# Patient Record
Sex: Male | Born: 1945 | Race: White | Hispanic: No | Smoking: Former smoker
Health system: Southern US, Community
[De-identification: ages and names within clinical notes are randomized; demographics above are authoritative.]

## PROBLEM LIST (undated history)

## (undated) DIAGNOSIS — D649 Anemia, unspecified: Secondary | ICD-10-CM

## (undated) DIAGNOSIS — J449 Chronic obstructive pulmonary disease, unspecified: Secondary | ICD-10-CM

## (undated) DIAGNOSIS — I10 Essential (primary) hypertension: Secondary | ICD-10-CM

## (undated) DIAGNOSIS — C801 Malignant (primary) neoplasm, unspecified: Secondary | ICD-10-CM

## (undated) DIAGNOSIS — Z9289 Personal history of other medical treatment: Secondary | ICD-10-CM

---

## 1998-10-12 ENCOUNTER — Encounter: Payer: Self-pay | Admitting: Family Medicine

## 1998-10-12 ENCOUNTER — Ambulatory Visit (HOSPITAL_COMMUNITY): Admission: RE | Admit: 1998-10-12 | Discharge: 1998-10-12 | Payer: Self-pay | Admitting: Family Medicine

## 2000-01-15 ENCOUNTER — Ambulatory Visit (HOSPITAL_COMMUNITY): Admission: RE | Admit: 2000-01-15 | Discharge: 2000-01-15 | Payer: Self-pay | Admitting: Family Medicine

## 2000-01-15 ENCOUNTER — Encounter: Payer: Self-pay | Admitting: Family Medicine

## 2000-02-14 ENCOUNTER — Ambulatory Visit (HOSPITAL_COMMUNITY): Admission: RE | Admit: 2000-02-14 | Discharge: 2000-02-14 | Payer: Self-pay | Admitting: *Deleted

## 2006-01-10 ENCOUNTER — Ambulatory Visit (HOSPITAL_COMMUNITY): Admission: RE | Admit: 2006-01-10 | Discharge: 2006-01-10 | Payer: Self-pay | Admitting: General Surgery

## 2006-04-30 ENCOUNTER — Emergency Department (HOSPITAL_COMMUNITY): Admission: EM | Admit: 2006-04-30 | Discharge: 2006-04-30 | Payer: Self-pay | Admitting: Emergency Medicine

## 2008-01-09 IMAGING — CR DG CHEST 2V
2 series · 2 of 2 positions shown · non-contrast
Comparison: 01/15/00.

CLINICAL DATA: Left inguinal hernia.  History of smoking.
 CHEST ? 2 VIEW ? 01/10/06:

[view not recorded (1 of 2)]
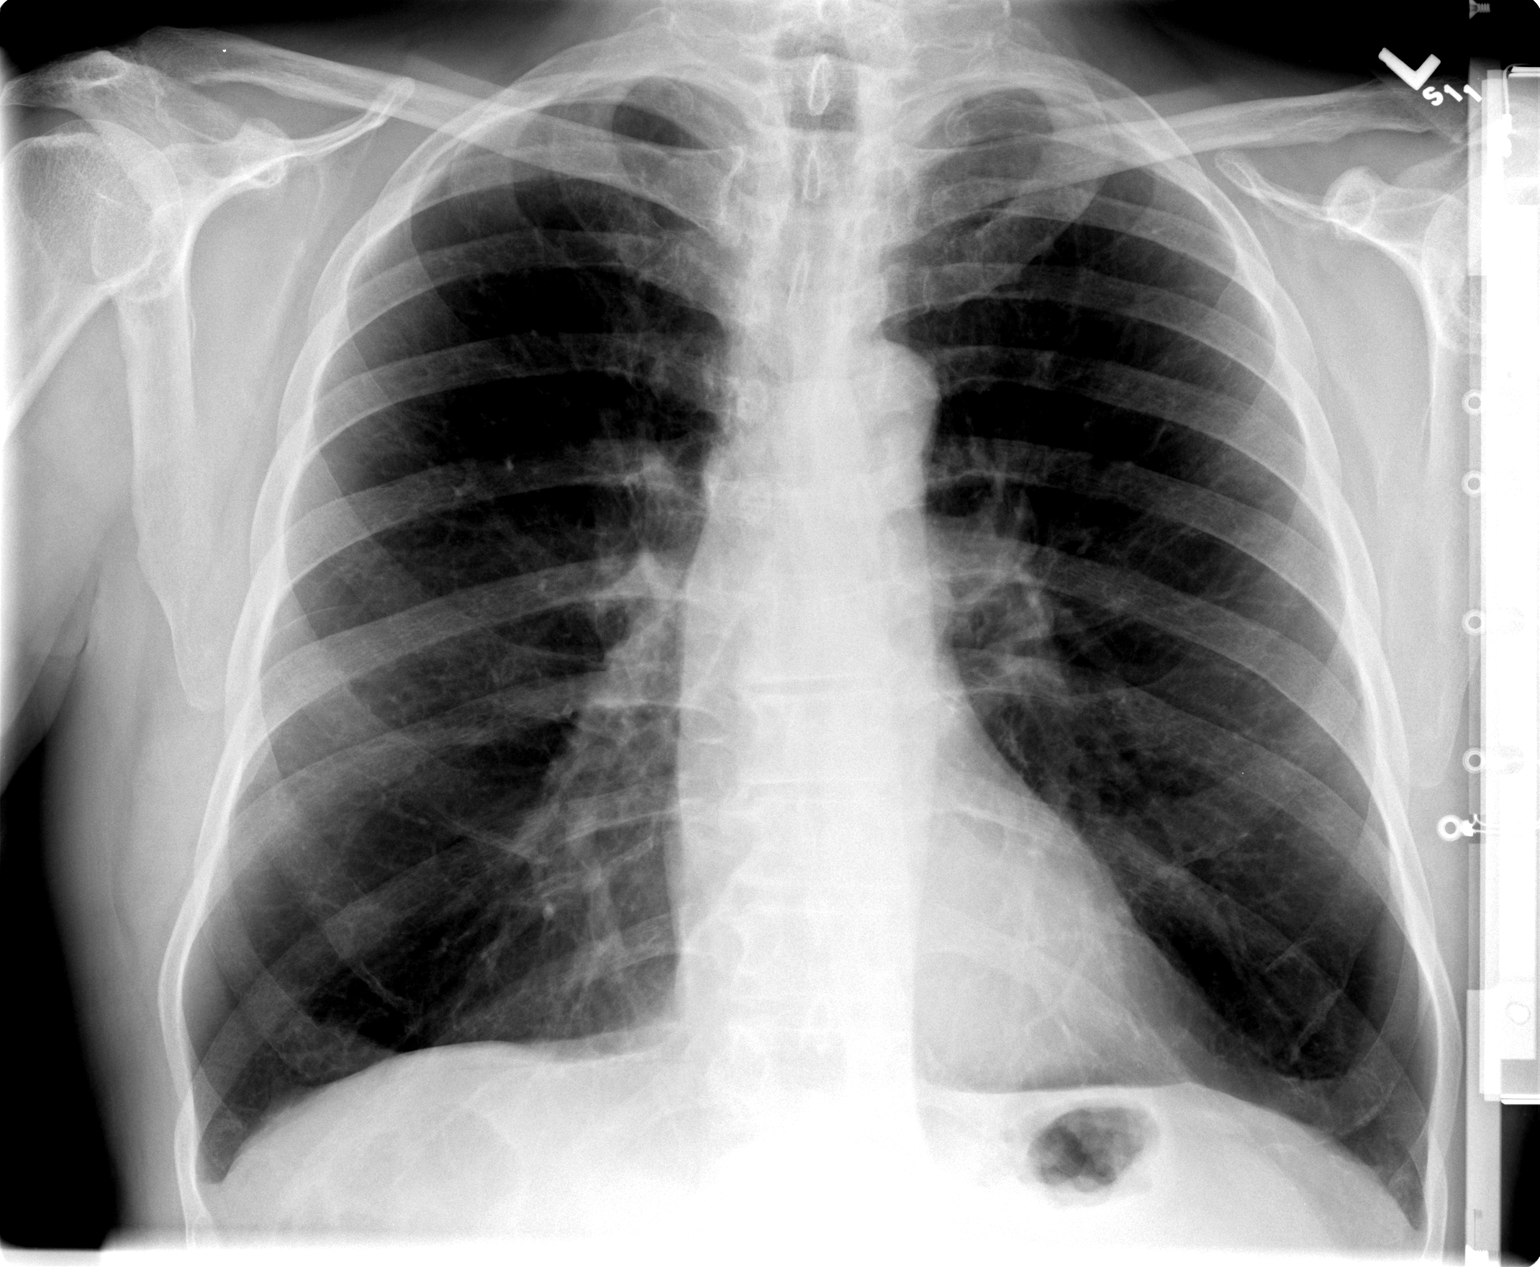

[view not recorded (2 of 2)]
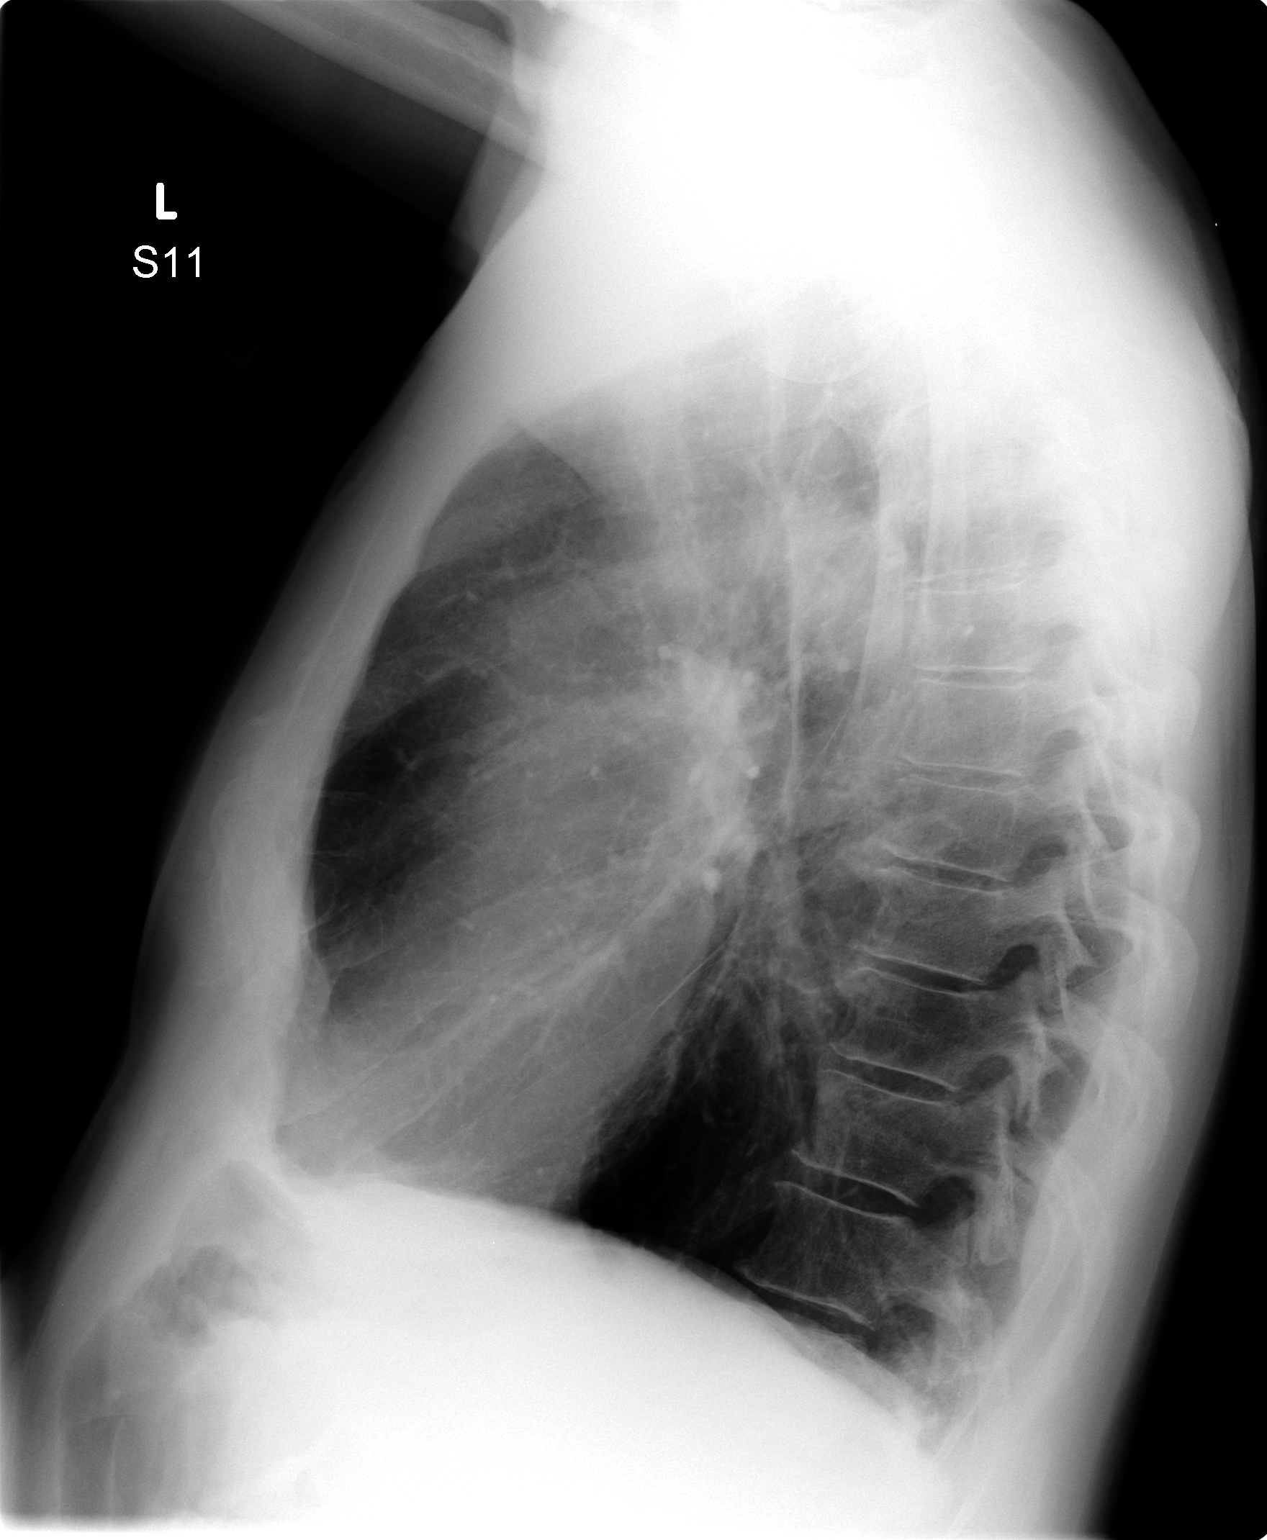

[2 of 2 positions shown; findings below may reference images not displayed]

FINDINGS: The heart size and mediastinal contours are within normal limits.  Both lungs are clear.  The visualized skeletal structures are unremarkable.
IMPRESSION: No active cardiopulmonary disease.

## 2008-04-28 IMAGING — CR DG CHEST 1V PORT
1 series · 1 of 1 positions shown · non-contrast
Comparison: 01/10/2006.

CLINICAL DATA: Chest pain.  Productive cough.  Smoker.   
 PORTABLE CHEST - 1 VIEW:

[view not recorded]
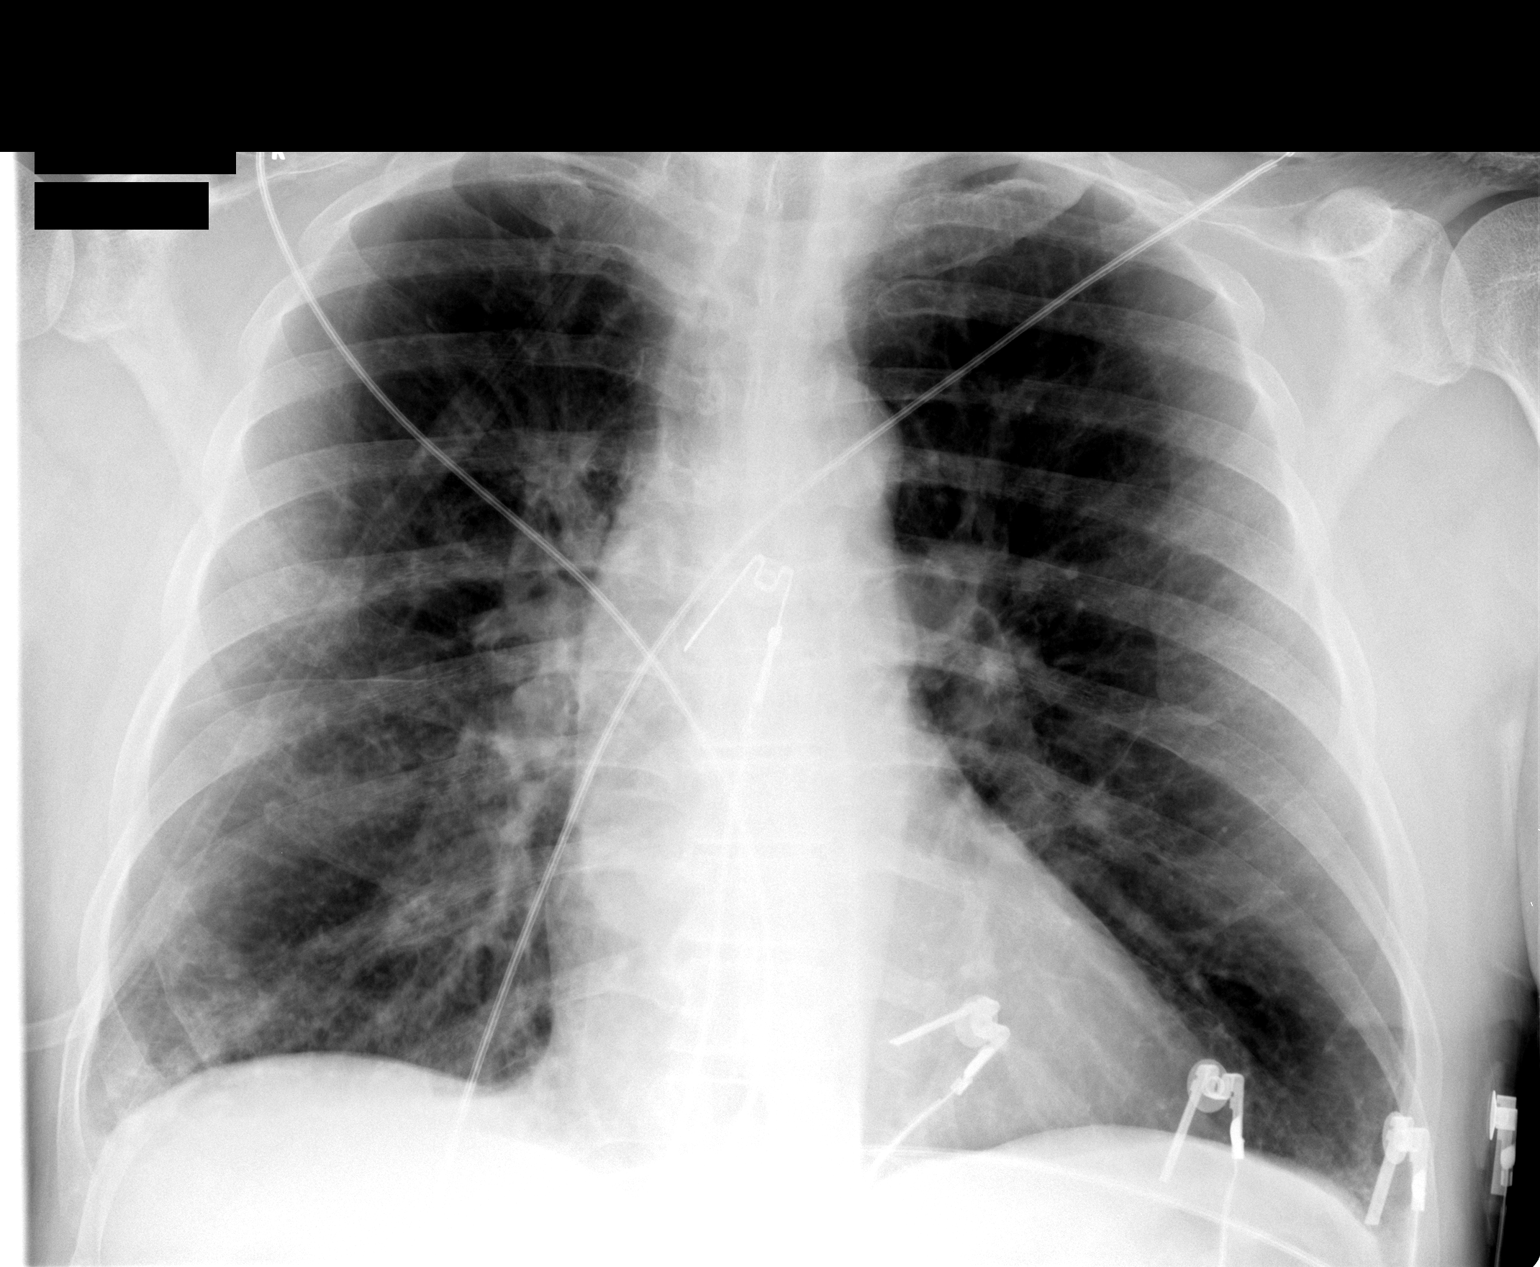

[1 of 1 positions shown; findings below may reference images not displayed]

There has been interval development of mild pulmonary infiltrate in the right lower lung, suspicious for pneumonia.  Left lung remains clear.  Changes of COPD are again seen.  Heart size is within normal limits.
IMPRESSION: 1.   Interval development of mild infiltrate in the right lower lung zone, suspicious for pneumonia.   Radiographic followup is recommended.  
 2.  COPD.

## 2012-10-06 ENCOUNTER — Ambulatory Visit
Admission: RE | Admit: 2012-10-06 | Discharge: 2012-10-06 | Disposition: A | Discharge status: Home / Self Care | Payer: Medicare HMO | Source: Ambulatory Visit | Attending: Family Medicine | Admitting: Family Medicine

## 2012-10-06 ENCOUNTER — Other Ambulatory Visit: Payer: Self-pay | Admitting: Family Medicine

## 2012-10-06 DIAGNOSIS — J449 Chronic obstructive pulmonary disease, unspecified: Secondary | ICD-10-CM

## 2013-08-30 ENCOUNTER — Emergency Department (HOSPITAL_COMMUNITY): Payer: Medicare HMO

## 2013-08-30 ENCOUNTER — Encounter (HOSPITAL_COMMUNITY): Payer: Self-pay | Admitting: Emergency Medicine

## 2013-08-30 ENCOUNTER — Inpatient Hospital Stay (HOSPITAL_COMMUNITY)
Admission: EM | Admit: 2013-08-30 | Discharge: 2013-09-01 | DRG: 189 | Disposition: A | Discharge status: Home / Self Care | Payer: Medicare HMO | Attending: Internal Medicine | Admitting: Internal Medicine

## 2013-08-30 DIAGNOSIS — J209 Acute bronchitis, unspecified: Secondary | ICD-10-CM | POA: Diagnosis present

## 2013-08-30 DIAGNOSIS — F17200 Nicotine dependence, unspecified, uncomplicated: Secondary | ICD-10-CM | POA: Diagnosis present

## 2013-08-30 DIAGNOSIS — Z79899 Other long term (current) drug therapy: Secondary | ICD-10-CM

## 2013-08-30 DIAGNOSIS — J9600 Acute respiratory failure, unspecified whether with hypoxia or hypercapnia: Principal | ICD-10-CM | POA: Diagnosis present

## 2013-08-30 DIAGNOSIS — R0603 Acute respiratory distress: Secondary | ICD-10-CM | POA: Diagnosis present

## 2013-08-30 DIAGNOSIS — J440 Chronic obstructive pulmonary disease with acute lower respiratory infection: Secondary | ICD-10-CM | POA: Diagnosis present

## 2013-08-30 DIAGNOSIS — R0602 Shortness of breath: Secondary | ICD-10-CM | POA: Diagnosis present

## 2013-08-30 DIAGNOSIS — I10 Essential (primary) hypertension: Secondary | ICD-10-CM | POA: Diagnosis present

## 2013-08-30 DIAGNOSIS — J984 Other disorders of lung: Secondary | ICD-10-CM

## 2013-08-30 HISTORY — DX: Chronic obstructive pulmonary disease, unspecified: J44.9

## 2013-08-30 HISTORY — DX: Essential (primary) hypertension: I10

## 2013-08-30 LAB — BASIC METABOLIC PANEL
BUN: 25 mg/dL — ABNORMAL HIGH (ref 6–23)
CO2: 30 mEq/L (ref 19–32)
Calcium: 9.6 mg/dL (ref 8.4–10.5)
Chloride: 103 mEq/L (ref 96–112)
Creatinine, Ser: 1.05 mg/dL (ref 0.50–1.35)
GFR calc Af Amer: 82 mL/min — ABNORMAL LOW (ref >90)
Potassium: 3.8 mEq/L (ref 3.5–5.1)
Sodium: 143 mEq/L (ref 135–145)

## 2013-08-30 LAB — CBC WITH DIFFERENTIAL/PLATELET
Basophils Absolute: 0 10*3/uL (ref 0.0–0.1)
Basophils Relative: 0 % (ref 0–1)
Eosinophils Relative: 1 % (ref 0–5)
HCT: 38.1 % — ABNORMAL LOW (ref 39.0–52.0)
Hemoglobin: 12.9 g/dL — ABNORMAL LOW (ref 13.0–17.0)
Lymphs Abs: 1.3 10*3/uL (ref 0.7–4.0)
MCHC: 33.9 g/dL (ref 30.0–36.0)
MCV: 90.1 fL (ref 78.0–100.0)
Monocytes Absolute: 0.3 10*3/uL (ref 0.1–1.0)
Monocytes Relative: 4 % (ref 3–12)
Neutro Abs: 6.8 10*3/uL (ref 1.7–7.7)
RDW: 13.4 % (ref 11.5–15.5)

## 2013-08-30 MED ORDER — LEVOFLOXACIN IN D5W 750 MG/150ML IV SOLN
750.0000 mg | INTRAVENOUS | Status: DC
  Administered 2013-08-30: 750 mg via INTRAVENOUS
  Filled 2013-08-30: qty 150

## 2013-08-30 MED ORDER — LISINOPRIL 20 MG PO TABS
20.0000 mg | ORAL_TABLET | Freq: Every day | ORAL | Status: DC
  Administered 2013-08-30 – 2013-09-01 (×3): 20 mg via ORAL
  Filled 2013-08-30 (×4): qty 1

## 2013-08-30 MED ORDER — LEVOFLOXACIN 750 MG PO TABS
750.0000 mg | ORAL_TABLET | ORAL | Status: DC
  Administered 2013-08-30: 750 mg via ORAL
  Filled 2013-08-30: qty 1

## 2013-08-30 MED ORDER — SODIUM CHLORIDE 0.9 % IV SOLN: 250.0000 mL | INTRAVENOUS | Status: DC | PRN

## 2013-08-30 MED ORDER — ALBUTEROL SULFATE (5 MG/ML) 0.5% IN NEBU
2.5000 mg | INHALATION_SOLUTION | Freq: Four times a day (QID) | RESPIRATORY_TRACT | Status: DC
  Administered 2013-08-30 – 2013-08-31 (×3): 2.5 mg via RESPIRATORY_TRACT
  Filled 2013-08-30 (×3): qty 0.5

## 2013-08-30 MED ORDER — ONDANSETRON HCL 4 MG PO TABS: 4.0000 mg | ORAL_TABLET | Freq: Four times a day (QID) | ORAL | Status: DC | PRN

## 2013-08-30 MED ORDER — IPRATROPIUM BROMIDE 0.02 % IN SOLN
RESPIRATORY_TRACT | Status: AC
  Filled 2013-08-30: qty 5

## 2013-08-30 MED ORDER — PREDNISONE 50 MG PO TABS
50.0000 mg | ORAL_TABLET | Freq: Every day | ORAL | Status: DC
  Administered 2013-08-31: 50 mg via ORAL
  Filled 2013-08-30 (×2): qty 1

## 2013-08-30 MED ORDER — IPRATROPIUM BROMIDE 0.02 % IN SOLN
0.5000 mg | Freq: Four times a day (QID) | RESPIRATORY_TRACT | Status: DC
  Administered 2013-08-30 – 2013-08-31 (×3): 0.5 mg via RESPIRATORY_TRACT
  Filled 2013-08-30 (×3): qty 2.5

## 2013-08-30 MED ORDER — SIMVASTATIN 20 MG PO TABS
20.0000 mg | ORAL_TABLET | Freq: Every day | ORAL | Status: DC
  Administered 2013-08-31 – 2013-09-01 (×2): 20 mg via ORAL
  Filled 2013-08-30 (×4): qty 1

## 2013-08-30 MED ORDER — ALBUTEROL SULFATE (5 MG/ML) 0.5% IN NEBU: 5.0000 mg | INHALATION_SOLUTION | RESPIRATORY_TRACT | Status: DC | PRN

## 2013-08-30 MED ORDER — ALBUTEROL SULFATE (5 MG/ML) 0.5% IN NEBU: 2.5000 mg | INHALATION_SOLUTION | RESPIRATORY_TRACT | Status: DC | PRN

## 2013-08-30 MED ORDER — ONDANSETRON HCL 4 MG/2ML IJ SOLN: 4.0000 mg | Freq: Four times a day (QID) | INTRAMUSCULAR | Status: DC | PRN

## 2013-08-30 MED ORDER — BUDESONIDE-FORMOTEROL FUMARATE 160-4.5 MCG/ACT IN AERO
2.0000 | INHALATION_SPRAY | Freq: Two times a day (BID) | RESPIRATORY_TRACT | Status: DC
  Administered 2013-08-30 – 2013-09-01 (×4): 2 via RESPIRATORY_TRACT
  Filled 2013-08-30: qty 6

## 2013-08-30 MED ORDER — SODIUM CHLORIDE 0.9 % IJ SOLN: 3.0000 mL | INTRAMUSCULAR | Status: DC | PRN

## 2013-08-30 MED ORDER — HYDRALAZINE HCL 20 MG/ML IJ SOLN: 10.0000 mg | Freq: Four times a day (QID) | INTRAMUSCULAR | Status: DC | PRN

## 2013-08-30 MED ORDER — SODIUM CHLORIDE 0.9 % IJ SOLN
3.0000 mL | Freq: Two times a day (BID) | INTRAMUSCULAR | Status: DC
  Administered 2013-08-31 (×2): 3 mL via INTRAVENOUS

## 2013-08-30 MED ORDER — ALBUTEROL (5 MG/ML) CONTINUOUS INHALATION SOLN
INHALATION_SOLUTION | RESPIRATORY_TRACT | Status: AC
  Filled 2013-08-30: qty 20

## 2013-08-30 NOTE — ED Notes (Signed)
Report given to floor, Teresa Coombs, RN. Nurse has no further questions upon report given. Pt being prepared for transport to floor via Harriett Sine, Charity fundraiser.

## 2013-08-30 NOTE — ED Notes (Signed)
Patient from home via [facility] c/o of SOB that progressively got worse this morning.  Patient states " I have been sick for about two weeks with congestion.  I've also have a cough with some junk coming up"  Patient denies any pain or fever.  Patient received 15 of albuterol, 1 of Atrovent and 125 of solumedrol PTA with EMS.  NAD at this time, patient receiving neb treatment at this time.  Respiratory and MD at bedside.

## 2013-08-30 NOTE — ED Provider Notes (Signed)
CSN: [id number]     Arrival date & time 08/30/13  1309 History   First MD Initiated Contact with Patient 08/30/13 1323     Chief Complaint  Patient presents with  . Shortness of Breath   (Consider location/radiation/quality/duration/timing/severity/associated sxs/prior Treatment) Patient is a 67 y.o. male presenting with shortness of breath.  Shortness of Breath Severity:  Severe Onset quality:  Gradual Duration:  2 weeks Timing:  Constant Progression:  Worsening Chronicity:  Recurrent Context: URI   Relieved by:  Oxygen (cpap, albuterol) Worsened by:  Nothing tried Associated symptoms: cough and sputum production   Associated symptoms: no abdominal pain, no chest pain, no fever and no vomiting     Past Medical History  Diagnosis Date  . COPD (chronic obstructive pulmonary disease)   . Hypertension    History reviewed. No pertinent past surgical history. History reviewed. No pertinent family history. History  Substance Use Topics  . Smoking status: Current Some Day Smoker -- 0.50 packs/day for 25 years    Types: Cigarettes  . Smokeless tobacco: Not on file  . Alcohol Use: No    Review of Systems  Constitutional: Negative for fever.  HENT: Negative for congestion.   Respiratory: Positive for cough, sputum production and shortness of breath.   Cardiovascular: Negative for chest pain.  Gastrointestinal: Negative for nausea, vomiting, abdominal pain and diarrhea.  All other systems reviewed and are negative.    Allergies  Review of patient's allergies indicates no known allergies.  Home Medications  No current outpatient prescriptions on file. BP 126/63  Pulse 121  Temp(Src) 98.7 F (37.1 C) (Axillary)  Resp 19  SpO2 98% Physical Exam  Nursing note and vitals reviewed. Constitutional: He is oriented to person, place, and time. He appears well-developed and well-nourished. No distress.  HENT:  Head: Normocephalic and atraumatic.  Mouth/Throat: Oropharynx is  clear and moist.  Eyes: Conjunctivae are normal. Pupils are equal, round, and reactive to light. No scleral icterus.  Neck: Neck supple.  Cardiovascular: Normal rate, regular rhythm, normal heart sounds and intact distal pulses.   No murmur heard. Pulmonary/Chest: Accessory muscle usage present. No stridor. Tachypnea noted. He is in respiratory distress. He has decreased breath sounds. He has wheezes. He has no rales.  Abdominal: Soft. He exhibits no distension. There is no tenderness.  Musculoskeletal: Normal range of motion. He exhibits no edema.  Neurological: He is alert and oriented to person, place, and time.  Skin: Skin is warm and dry. No rash noted.  Psychiatric: He has a normal mood and affect. His behavior is normal.    ED Course  CRITICAL CARE Performed by: Blake Divine DAVID Authorized by: Blake Divine DAVID Total critical care time: 35 minutes Critical care time was exclusive of separately billable procedures and treating other patients. Critical care was necessary to treat or prevent imminent or life-threatening deterioration of the following conditions: respiratory failure. Critical care was time spent personally by me on the following activities: development of treatment plan with patient or surrogate, discussions with consultants, evaluation of patient's response to treatment, examination of patient, obtaining history from patient or surrogate, ordering and performing treatments and interventions, ordering and review of laboratory studies, ordering and review of radiographic studies, pulse oximetry, re-evaluation of patient's condition and review of old charts.   (including critical care time) Labs Review All labs drawn in ED reviewed.  Imaging Review Dg Chest Port 1 View  08/30/2013   CLINICAL DATA:  Shortness of Breath. COPD and hypertension.  EXAM: PORTABLE CHEST - 1 VIEW  COMPARISON:  10/06/2012  FINDINGS: COPD/hyperinflation. Multiple leads and wires overlie the  chest. Midline trachea. Normal heart size with tortuous thoracic aorta. Mild pleural thickening blunts the costophrenic angles bilaterally. No pneumothorax. No lobar consolidation.  IMPRESSION: COPD/hyperinflation, without acute superimposed process.   Electronically Signed   By: Jeronimo Greaves   On: 08/30/2013 14:01  All radiology studies independently viewed by me.     MDM   1. Acute respiratory distress   2. COPD (chronic obstructive pulmonary disease) with acute bronchitis   3. HTN (hypertension)   4. SOB (shortness of breath)    67 yo male with hx of COPD presenting in respiratory distress.  O2 sats in mid 70's on EMS arrival on scene.  CPAP initiated PTA.  Solumedrol given PTA  Able to dc bipap and treat with continuous albuterol and Atrovent neb.  Eventually, air movement improved, but he still required supplemental O2.  Admitted to internal medicine.  IV levofloxacin administered.      Candyce Churn, MD 08/31/13 [phone]

## 2013-08-30 NOTE — H&P (Signed)
[facility] History and Physical  [name]:[id number] DOB: 12/14/1945 DOA: 08/30/2013  Referring physician: er PCP: No primary provider on file. - Dr. At brown [facility]:   Chief Complaint: sob  HPI: William Villegas is a 67 y.o. male  Who has history of COPD, not on home O2.  Last exacerbation was about a year ago.  Still smoking about 1 ppd.  Developed SOB this AM and it has gradually worsened. He has + sick contacts with grandson (head cold) and has had upper resp symptoms for about 2 weeks.  +production- not sure of color.  Given albuterol, Atrovent, solumedrol and O2 with improvement in ER.  No fever, no chills  In the ER, his x ray was clear- no PNA and on 2L was oxygenating well.    Review of Systems: all systems reviewed, negative unless stated above   Past Medical History  Diagnosis Date  . COPD (chronic obstructive pulmonary disease)   . Hypertension    History reviewed. No pertinent past surgical history. Social History:  reports that he has been smoking Cigarettes.  He has a 12.5 pack-year smoking history. He does not have any smokeless tobacco history on file. He reports that he does not drink alcohol or use illicit drugs.  No Known Allergies  History reviewed. No pertinent family history.   Prior to Admission medications   Medication Sig Start Date End Date Taking? Authorizing Provider  albuterol (PROVENTIL HFA;VENTOLIN HFA) 108 (90 BASE) MCG/ACT inhaler Inhale 2 puffs into the lungs every 6 (six) hours as needed for wheezing.   Yes Historical Provider, MD  budesonide-formoterol (SYMBICORT) 160-4.5 MCG/ACT inhaler Inhale 2 puffs into the lungs 2 (two) times daily.   Yes Historical Provider, MD  pravastatin (PRAVACHOL) 40 MG tablet Take 40 mg by mouth daily.   Yes Historical Provider, MD  lisinopril (PRINIVIL,ZESTRIL) 20 MG tablet Take 20 mg by mouth daily.    Historical Provider, MD   Physical Exam: Filed Vitals:   08/30/13 1323  BP: 126/63   Pulse: 121  Temp: 98.7 F (37.1 C)  Resp: 19     General:  A+Ox3, mild work of breathing  Eyes: wnl  ENT: wnl  Neck: supple  Cardiovascular: rrr  Respiratory: mild wheezing, no crackles  Abdomen: +BS, soft, NT  Skin: no rashes or lesions  Musculoskeletal: moves all 4 ext  Psychiatric: no SI, no HI  Neurologic: CN 2-12 intact  Labs on Admission:  Basic Metabolic Panel:  Recent Labs Lab 08/30/13 1349  NA 143  K 3.8  CL 103  CO2 30  GLUCOSE 183*  BUN 25*  CREATININE 1.05  CALCIUM 9.6   Liver Function Tests: No results found for this basename: AST, ALT, ALKPHOS, BILITOT, PROT, ALBUMIN,  in the last 168 hours No results found for this basename: LIPASE, AMYLASE,  in the last 168 hours No results found for this basename: AMMONIA,  in the last 168 hours CBC:  Recent Labs Lab 08/30/13 1349  WBC 8.5  NEUTROABS 6.8  HGB 12.9*  HCT 38.1*  MCV 90.1  PLT 241   Cardiac Enzymes: No results found for this basename: CKTOTAL, CKMB, CKMBINDEX, TROPONINI,  in the last 168 hours  BNP (last 3 results) No results found for this basename: PROBNP,  in the last 8760 hours CBG: No results found for this basename: GLUCAP,  in the last 168 hours  Radiological Exams on Admission: Dg Chest Port 1 View  08/30/2013   CLINICAL DATA:  Shortness of  Breath. COPD and hypertension.  EXAM: PORTABLE CHEST - 1 VIEW  COMPARISON:  10/06/2012  FINDINGS: COPD/hyperinflation. Multiple leads and wires overlie the chest. Midline trachea. Normal heart size with tortuous thoracic aorta. Mild pleural thickening blunts the costophrenic angles bilaterally. No pneumothorax. No lobar consolidation.  IMPRESSION: COPD/hyperinflation, without acute superimposed process.   Electronically Signed   By: Jeronimo Greaves   On: 08/30/2013 14:01     Assessment/Plan Active Problems:   Acute respiratory distress   SOB (shortness of breath)   COPD (chronic obstructive pulmonary disease) with acute bronchitis    HTN (hypertension)   1. Acute resp failure: not on O2 at home- requiring 2 L here, wean off as tolerated 2. SOB: steroids, nebs, abx 3. COPD: very mild exacerbation- patient would like to avoid IV medications, PO steroids, abx 4. HTN- continue home meds 5. Tobacco abuse- encouraged cessation    Code Status: full Family Communication: patient Disposition Plan: obs  Time spent: 75 min  VANN, JESSICA [facility] Pager [phone]  If 7PM-7AM, please contact night-coverage [email or web address] Password [facility] 08/30/2013, 3:37 PM

## 2013-08-31 LAB — CBC
HCT: 33.6 % — ABNORMAL LOW (ref 39.0–52.0)
MCH: 29.9 pg (ref 26.0–34.0)
MCHC: 33.9 g/dL (ref 30.0–36.0)
Platelets: 226 10*3/uL (ref 150–400)
RBC: 3.81 MIL/uL — ABNORMAL LOW (ref 4.22–5.81)
RDW: 13.2 % (ref 11.5–15.5)
WBC: 4.7 10*3/uL (ref 4.0–10.5)

## 2013-08-31 LAB — BASIC METABOLIC PANEL
Calcium: 10 mg/dL (ref 8.4–10.5)
Chloride: 101 mEq/L (ref 96–112)
Creatinine, Ser: 1.05 mg/dL (ref 0.50–1.35)
GFR calc non Af Amer: 70 mL/min — ABNORMAL LOW (ref >90)
Glucose, Bld: 131 mg/dL — ABNORMAL HIGH (ref 70–99)
Sodium: 138 mEq/L (ref 135–145)

## 2013-08-31 MED ORDER — LEVOFLOXACIN 750 MG PO TABS
750.0000 mg | ORAL_TABLET | Freq: Every day | ORAL | Status: DC
  Administered 2013-08-31: 750 mg via ORAL
  Filled 2013-08-31 (×2): qty 1

## 2013-08-31 MED ORDER — PREDNISONE 20 MG PO TABS
40.0000 mg | ORAL_TABLET | Freq: Two times a day (BID) | ORAL | Status: DC
  Administered 2013-08-31 – 2013-09-01 (×2): 40 mg via ORAL
  Filled 2013-08-31 (×4): qty 2

## 2013-08-31 MED ORDER — IPRATROPIUM BROMIDE 0.02 % IN SOLN
0.5000 mg | Freq: Four times a day (QID) | RESPIRATORY_TRACT | Status: DC
  Administered 2013-08-31 – 2013-09-01 (×5): 0.5 mg via RESPIRATORY_TRACT
  Filled 2013-08-31 (×5): qty 2.5

## 2013-08-31 MED ORDER — ALBUTEROL SULFATE (5 MG/ML) 0.5% IN NEBU: 2.5000 mg | INHALATION_SOLUTION | Freq: Three times a day (TID) | RESPIRATORY_TRACT | Status: DC

## 2013-08-31 MED ORDER — IPRATROPIUM BROMIDE 0.02 % IN SOLN: 0.5000 mg | Freq: Three times a day (TID) | RESPIRATORY_TRACT | Status: DC

## 2013-08-31 MED ORDER — ALBUTEROL SULFATE (5 MG/ML) 0.5% IN NEBU
2.5000 mg | INHALATION_SOLUTION | Freq: Four times a day (QID) | RESPIRATORY_TRACT | Status: DC
  Administered 2013-08-31 – 2013-09-01 (×5): 2.5 mg via RESPIRATORY_TRACT
  Filled 2013-08-31 (×5): qty 0.5

## 2013-08-31 NOTE — Progress Notes (Signed)
SATURATION QUALIFICATIONS: (This note is used to comply with regulatory documentation for home oxygen)  Patient Saturations on Room Air at Rest = 91  Patient Saturations on Room Air while Ambulating =90  Patient Saturations on 2 Liters of oxygen while Ambulating = 93  Please briefly explain why patient needs home oxygen: 

## 2013-08-31 NOTE — Progress Notes (Signed)
Chart reviewed.  [facility] PROGRESS NOTE  William Villegas [id number]:[id number] DOB: [birth year] DOA: 08/30/2013 PCP: Leo Grosser, MD  Assessment/Plan:  Active Problems:   Acute respiratory distress   SOB (shortness of breath)   COPD (chronic obstructive pulmonary disease) with acute bronchitis   HTN (hypertension)  Patient feels too DOE for discharge. Per RN, sats did not drop with ambulation.  Change to inpatient.  Increase pred to 40 bid.  Needs to quit smoking  Code Status: *full Family Communication: *none Disposition Plan: home  Antibiotics:  levaquin 9/22  HPI/Subjective: Too DOE for discharge today  Objective: Filed Vitals:   08/31/13 0523  BP: 119/89  Pulse: 111  Temp: 97.9 F (36.6 C)  Resp: 17    Intake/Output Summary (Last 24 hours) at 08/31/13 1136 Last data filed at 08/31/13 0900  Gross per 24 hour  Intake    240 ml  Output      0 ml  Net    240 ml   Filed Weights   08/30/13 1740  Weight: 72.576 kg (160 lb)    Exam:   General:  Mild respiratory distress when talking.  Cardiovascular: RRR without MGR  Respiratory: diminished thoughout without WRR  Ext no CCE  Data Reviewed: Basic Metabolic Panel:  Recent Labs Lab 08/30/13 1349 08/31/13 0420  NA 143 138  K 3.8 4.0  CL 103 101  CO2 30 26  GLUCOSE 183* 131*  BUN 25* 31*  CREATININE 1.05 1.05  CALCIUM 9.6 10.0   Liver Function Tests: No results found for this basename: AST, ALT, ALKPHOS, BILITOT, PROT, ALBUMIN,  in the last 168 hours No results found for this basename: LIPASE, AMYLASE,  in the last 168 hours No results found for this basename: AMMONIA,  in the last 168 hours CBC:  Recent Labs Lab 08/30/13 1349 08/31/13 0420  WBC 8.5 4.7  NEUTROABS 6.8  --   HGB 12.9* 11.4*  HCT 38.1* 33.6*  MCV 90.1 88.2  PLT 241 226   Cardiac Enzymes: No results found for this basename: CKTOTAL, CKMB, CKMBINDEX, TROPONINI,  in the last 168 hours BNP (last 3 results) No  results found for this basename: PROBNP,  in the last 8760 hours CBG: No results found for this basename: GLUCAP,  in the last 168 hours  No results found for this or any previous visit (from the past 240 hour(s)).   Studies: Dg Chest Port 1 View  08/30/2013   CLINICAL DATA:  Shortness of Breath. COPD and hypertension.  EXAM: PORTABLE CHEST - 1 VIEW  COMPARISON:  10/06/2012  FINDINGS: COPD/hyperinflation. Multiple leads and wires overlie the chest. Midline trachea. Normal heart size with tortuous thoracic aorta. Mild pleural thickening blunts the costophrenic angles bilaterally. No pneumothorax. No lobar consolidation.  IMPRESSION: COPD/hyperinflation, without acute superimposed process.   Electronically Signed   By: Jeronimo Greaves   On: 08/30/2013 14:01    Scheduled Meds: . albuterol  2.5 mg Nebulization TID  . budesonide-formoterol  2 puff Inhalation BID  . ipratropium  0.5 mg Nebulization TID  . levofloxacin  750 mg Oral Q48H  . lisinopril  20 mg Oral Daily  . predniSONE  50 mg Oral Q breakfast  . simvastatin  20 mg Oral q1800  . sodium chloride  3 mL Intravenous Q12H   Continuous Infusions:   Time spent: 30 min  Piercen Covino L  [facility] Pager [phone] If 7PM-7AM, please contact night-coverage at [email or web address], password [facility] 08/31/2013, 11:36 AM  LOS: 1 day

## 2013-09-01 DIAGNOSIS — F17200 Nicotine dependence, unspecified, uncomplicated: Secondary | ICD-10-CM | POA: Diagnosis present

## 2013-09-01 MED ORDER — LEVOFLOXACIN 750 MG PO TABS: 750.0000 mg | ORAL_TABLET | Freq: Every day | ORAL | Status: DC

## 2013-09-01 MED ORDER — PREDNISONE 20 MG PO TABS: 40.0000 mg | ORAL_TABLET | Freq: Two times a day (BID) | ORAL | Status: DC

## 2013-09-01 MED ORDER — BUPROPION HCL ER (SR) 150 MG PO TB12: ORAL_TABLET | ORAL | Status: DC

## 2013-09-01 NOTE — Discharge Summary (Signed)
Physician Discharge Summary  William Villegas AVW:[id number] DOB: 10/01/1946 DOA: 08/30/2013  PCP: Leo Grosser, MD  Admit date: 08/30/2013 Discharge date: 09/01/2013  Time spent: 40 minutes  Recommendations for Outpatient Follow-up:  1. COPD exacerbation/acute respiratory distress: At baseline  SATURATION QUALIFICATIONS: (This note is used to comply with regulatory documentation for home oxygen)  Patient Saturations on Room Air at Rest = 91%  Patient Saturations on Room Air while Ambulating = 90%  Patient Saturations on 2 Liters of oxygen while Ambulating = 93%  Please briefly explain why patient needs home oxygen: -Patient does not qualify for home O2 -SOB: steroids, nebs, abx -patient would like to avoid IV medications, PO steroids, abx  2. HTN- continue home meds 3. Tobacco abuse- patient requests help to stop smoking ; started on Wellbutrin     Discharge Diagnoses:  Active Problems:   Acute respiratory distress   SOB (shortness of breath)   COPD (chronic obstructive pulmonary disease) with acute bronchitis   HTN (hypertension)   Discharge Condition: Stable  Diet recommendation: Regular  Filed Weights   08/30/13 1740  Weight: 72.576 kg (160 lb)    History of present illness:  67 y.o. male PMHx  Who has history of COPD, not on home O2. Last exacerbation was about a year ago. Still smoking about 1 ppd. Developed SOB this AM and it has gradually worsened. He has + sick contacts with grandson (head cold) and has had upper resp symptoms for about 2 weeks. +production- not sure of color. Given albuterol, Atrovent, solumedrol and O2 with improvement in ER. No fever, no chills TODAY states his breathing much easier, and is ready for discharge. Patient also states this episode scared him and would like some help in stopping smoking   Procedures:  Consultations:  Antibiotics Levofloxacin 9/23, day 2   Discharge Exam: Filed Vitals:   08/31/13 2051 08/31/13 2126  09/01/13 0457 09/01/13 0724  BP:  138/77 133/61   Pulse: 106 107 87   Temp:  98.2 F (36.8 C) 97.7 F (36.5 C)   TempSrc:  Oral Oral   Resp: 18  20   Height:      Weight:      SpO2: 95% 95% 95% 93%    General: A./O. X4, NAD Cardiovascular: Regular rhythm and rate, negative murmurs rubs or gallops  Respiratory: Good air movement in all lung fields mild expiratory wheezing  Discharge Instructions     Medication List    ASK your doctor about these medications       albuterol 108 (90 BASE) MCG/ACT inhaler  Commonly known as:  PROVENTIL HFA;VENTOLIN HFA  Inhale 2 puffs into the lungs every 6 (six) hours as needed for wheezing.     budesonide-formoterol 160-4.5 MCG/ACT inhaler  Commonly known as:  SYMBICORT  Inhale 2 puffs into the lungs 2 (two) times daily.     lisinopril 20 MG tablet  Commonly known as:  PRINIVIL,ZESTRIL  Take 20 mg by mouth daily.     pravastatin 40 MG tablet  Commonly known as:  PRAVACHOL  Take 40 mg by mouth daily.       No Known Allergies    The results of significant diagnostics from this hospitalization (including imaging, microbiology, ancillary and laboratory) are listed below for reference.    Significant Diagnostic Studies: Dg Chest Port 1 View  08/30/2013   CLINICAL DATA:  Shortness of Breath. COPD and hypertension.  EXAM: PORTABLE CHEST - 1 VIEW  COMPARISON:  10/06/2012  FINDINGS: COPD/hyperinflation.  Multiple leads and wires overlie the chest. Midline trachea. Normal heart size with tortuous thoracic aorta. Mild pleural thickening blunts the costophrenic angles bilaterally. No pneumothorax. No lobar consolidation.  IMPRESSION: COPD/hyperinflation, without acute superimposed process.   Electronically Signed   By: Jeronimo Greaves   On: 08/30/2013 14:01    Microbiology: No results found for this or any previous visit (from the past 240 hour(s)).   Labs: Basic Metabolic Panel:  Recent Labs Lab 08/30/13 1349 08/31/13 0420  NA 143 138   K 3.8 4.0  CL 103 101  CO2 30 26  GLUCOSE 183* 131*  BUN 25* 31*  CREATININE 1.05 1.05  CALCIUM 9.6 10.0   Liver Function Tests: No results found for this basename: AST, ALT, ALKPHOS, BILITOT, PROT, ALBUMIN,  in the last 168 hours No results found for this basename: LIPASE, AMYLASE,  in the last 168 hours No results found for this basename: AMMONIA,  in the last 168 hours CBC:  Recent Labs Lab 08/30/13 1349 08/31/13 0420  WBC 8.5 4.7  NEUTROABS 6.8  --   HGB 12.9* 11.4*  HCT 38.1* 33.6*  MCV 90.1 88.2  PLT 241 226   Cardiac Enzymes: No results found for this basename: CKTOTAL, CKMB, CKMBINDEX, TROPONINI,  in the last 168 hours BNP: BNP (last 3 results) No results found for this basename: PROBNP,  in the last 8760 hours CBG: No results found for this basename: GLUCAP,  in the last 168 hours     Signed:  Drema Dallas  [facility] [phone] pager 09/01/2013, 1:38 PM

## 2013-09-07 ENCOUNTER — Ambulatory Visit (INDEPENDENT_AMBULATORY_CARE_PROVIDER_SITE_OTHER): Payer: Medicare HMO | Admitting: Family Medicine

## 2013-09-07 ENCOUNTER — Encounter: Payer: Self-pay | Admitting: Family Medicine

## 2013-09-07 VITALS — BP 110/62 | HR 98 | Temp 98.2°F | Resp 20 | Wt 146.0 lb

## 2013-09-07 DIAGNOSIS — Z09 Encounter for follow-up examination after completed treatment for conditions other than malignant neoplasm: Secondary | ICD-10-CM

## 2013-09-07 DIAGNOSIS — J441 Chronic obstructive pulmonary disease with (acute) exacerbation: Secondary | ICD-10-CM

## 2013-09-07 MED ORDER — PREDNISONE 20 MG PO TABS: ORAL_TABLET | ORAL | Status: DC

## 2013-09-07 NOTE — Progress Notes (Signed)
Subjective:    Patient ID: William Villegas, male    DOB: Jan 21, 1946, 68 y.o.   MRN: [id number]  HPI  Patient was admitted to the hospital September 22 for a COPD exacerbation. He states that he was sitting at home his vision became blurry and he became very short of breath. It sounds as if he became hypoxic and may have had a presyncopal episode.  He called 911 and was dressed in hospital. He states that he does not remember the ambulance ride to the hospital as he was delirious. After they gave the patient oxygen and breathing treatments, he states that his mentation dramatically improved he became more lucid. He was given IV steroids, antibiotics, and breathing treatments every 4 hours in the hospital. After this treatment his breathing markedly improved. He was discharged home after 2 days. He was supposed to go home on prednisone 40 mg by mouth twice a day and Levaquin 750 mg by mouth daily for a total of one week. Unfortunately he ran out of steroids after one day. He's been off steroids now for 3 days.  He states that his breathing is somewhat worsening.  He is using Ventolin 2 puffs twice a day. He is also still using his Symbicort 2 puffs twice a day.  Thankfully, he is refrain from smoking since his hospital discharge. He states that he no longer wants to smoke due to the fear of dying and suffocation he experienced.  He did not require the Wellbutrin.   Past Medical History  Diagnosis Date  . COPD (chronic obstructive pulmonary disease)   . Hypertension    Current Outpatient Prescriptions on File Prior to Visit  Medication Sig Dispense Refill  . albuterol (PROVENTIL HFA;VENTOLIN HFA) 108 (90 BASE) MCG/ACT inhaler Inhale 2 puffs into the lungs every 6 (six) hours as needed for wheezing.      . budesonide-formoterol (SYMBICORT) 160-4.5 MCG/ACT inhaler Inhale 2 puffs into the lungs 2 (two) times daily.      Marland Kitchen buPROPion (WELLBUTRIN SR) 150 MG 12 hr tablet 1 tablet by mouth day one-day 3; then  1 tablet by mouth twice a day  60 tablet  0  . levofloxacin (LEVAQUIN) 750 MG tablet Take 1 tablet (750 mg total) by mouth daily at 6 PM.  7 tablet  0  . lisinopril (PRINIVIL,ZESTRIL) 20 MG tablet Take 20 mg by mouth daily.      . pravastatin (PRAVACHOL) 40 MG tablet Take 40 mg by mouth daily.      . predniSONE (DELTASONE) 20 MG tablet Take 2 tablets (40 mg total) by mouth 2 (two) times daily with a meal.  4 tablet  0   No current facility-administered medications on file prior to visit.   No Known Allergies History   Social History  . Marital Status: Divorced    Spouse Name: N/A    Number of Children: N/A  . Years of Education: N/A   Occupational History  . Not on file.   Social History Main Topics  . Smoking status: Current Some Day Smoker -- 0.50 packs/day for 25 years    Types: Cigarettes  . Smokeless tobacco: Not on file  . Alcohol Use: No  . Drug Use: No  . Sexual Activity: Not on file   Other Topics Concern  . Not on file   Social History Narrative  . No narrative on file     Review of Systems  All other systems reviewed and are negative.  Objective:   Physical Exam  Vitals reviewed. Constitutional: He appears well-developed and well-nourished.  HENT:  Head: Normocephalic.  Nose: Nose normal.  Mouth/Throat: Oropharynx is clear and moist.  Neck: No JVD present.  Cardiovascular: Normal rate, regular rhythm, normal heart sounds and intact distal pulses.  Exam reveals no gallop and no friction rub.   No murmur heard. Pulmonary/Chest: No accessory muscle usage. Not tachypneic. No respiratory distress. He has decreased breath sounds. He has wheezes. He has no rhonchi. He has no rales.  Abdominal: Soft. Bowel sounds are normal. He exhibits no distension and no mass. There is no tenderness. There is no rebound and no guarding.  Musculoskeletal: He exhibits no edema.  Lymphadenopathy:    He has no cervical adenopathy.          Assessment & Plan:  1.  COPD exacerbation I believe the patient was discontinued from oral steroids too quickly accidentally.  Therefore I'm going to start the patient on a prolonged steroid taper. I also recommended that he continue using Ventolin 2 puffs every 6 hours until his breathing completely improved. I recommended that he continue to use Symbicort twice daily as a maintenance medication. I congratulated the patient on smoking cessation and encouraged him to continue the hard work. I also started the patient on Tudorza 1 inhalation twice a day.  Recheck in 2 weeks to see if this has improved his breathing further.  I recommended a flu shot when the patient is no longer on oral steroids. - predniSONE (DELTASONE) 20 MG tablet; 3 tabs poqday 1-2, 2 tabs poqday 3-4, 1 tab poqday 5-6  Dispense: 12 tablet; Refill: 0  2. Hospital discharge follow-up

## 2013-09-13 ENCOUNTER — Telehealth: Payer: Self-pay | Admitting: Family Medicine

## 2013-09-13 MED ORDER — BUDESONIDE-FORMOTEROL FUMARATE 160-4.5 MCG/ACT IN AERO: 2.0000 | INHALATION_SPRAY | Freq: Two times a day (BID) | RESPIRATORY_TRACT | Status: DC

## 2013-09-13 MED ORDER — ALBUTEROL SULFATE HFA 108 (90 BASE) MCG/ACT IN AERS: 2.0000 | INHALATION_SPRAY | Freq: Four times a day (QID) | RESPIRATORY_TRACT | Status: DC | PRN

## 2013-09-13 MED ORDER — LISINOPRIL 20 MG PO TABS: 20.0000 mg | ORAL_TABLET | Freq: Every day | ORAL | Status: DC

## 2013-09-13 MED ORDER — BUPROPION HCL ER (SR) 150 MG PO TB12: ORAL_TABLET | ORAL | Status: DC

## 2013-09-13 MED ORDER — PRAVASTATIN SODIUM 40 MG PO TABS: 40.0000 mg | ORAL_TABLET | Freq: Every day | ORAL | Status: DC

## 2013-09-13 NOTE — Telephone Encounter (Signed)
Rx Refilled  

## 2013-09-13 NOTE — Telephone Encounter (Signed)
He needs both inhalers and his other Rxs called in to [facility]

## 2014-01-06 ENCOUNTER — Telehealth: Payer: Self-pay | Admitting: Family Medicine

## 2014-01-06 MED ORDER — ALBUTEROL SULFATE HFA 108 (90 BASE) MCG/ACT IN AERS: 2.0000 | INHALATION_SPRAY | Freq: Four times a day (QID) | RESPIRATORY_TRACT | Status: DC | PRN

## 2014-01-06 NOTE — Telephone Encounter (Signed)
Pt is needing a refill on his Pro air inhaler  Call back number is [phone] [facility] is [facility] [place]

## 2014-01-06 NOTE — Telephone Encounter (Signed)
Rx Refilled  

## 2014-01-12 ENCOUNTER — Other Ambulatory Visit: Payer: Self-pay | Admitting: Family Medicine

## 2014-01-12 MED ORDER — LISINOPRIL 20 MG PO TABS: 20.0000 mg | ORAL_TABLET | Freq: Every day | ORAL | Status: DC

## 2014-01-12 MED ORDER — ALBUTEROL SULFATE HFA 108 (90 BASE) MCG/ACT IN AERS: 2.0000 | INHALATION_SPRAY | Freq: Four times a day (QID) | RESPIRATORY_TRACT | Status: DC | PRN

## 2014-01-12 MED ORDER — PRAVASTATIN SODIUM 40 MG PO TABS: 40.0000 mg | ORAL_TABLET | Freq: Every day | ORAL | Status: DC

## 2014-01-12 MED ORDER — BUDESONIDE-FORMOTEROL FUMARATE 160-4.5 MCG/ACT IN AERO: 2.0000 | INHALATION_SPRAY | Freq: Two times a day (BID) | RESPIRATORY_TRACT | Status: DC

## 2014-01-12 NOTE — Telephone Encounter (Signed)
Rx Refilled  

## 2014-03-05 ENCOUNTER — Other Ambulatory Visit: Payer: Self-pay | Admitting: Family Medicine

## 2014-04-25 ENCOUNTER — Other Ambulatory Visit: Payer: Self-pay | Admitting: Family Medicine

## 2014-05-01 ENCOUNTER — Other Ambulatory Visit: Payer: Self-pay | Admitting: Family Medicine

## 2014-08-09 ENCOUNTER — Other Ambulatory Visit: Payer: Self-pay | Admitting: Family Medicine

## 2014-10-05 IMAGING — CR DG CHEST 2V
2 series · 2 of 2 positions shown · non-contrast
Comparison: April 30, 2006

CLINICAL DATA: Shortness of breath

CHEST - 2 VIEW

[w chest pa]
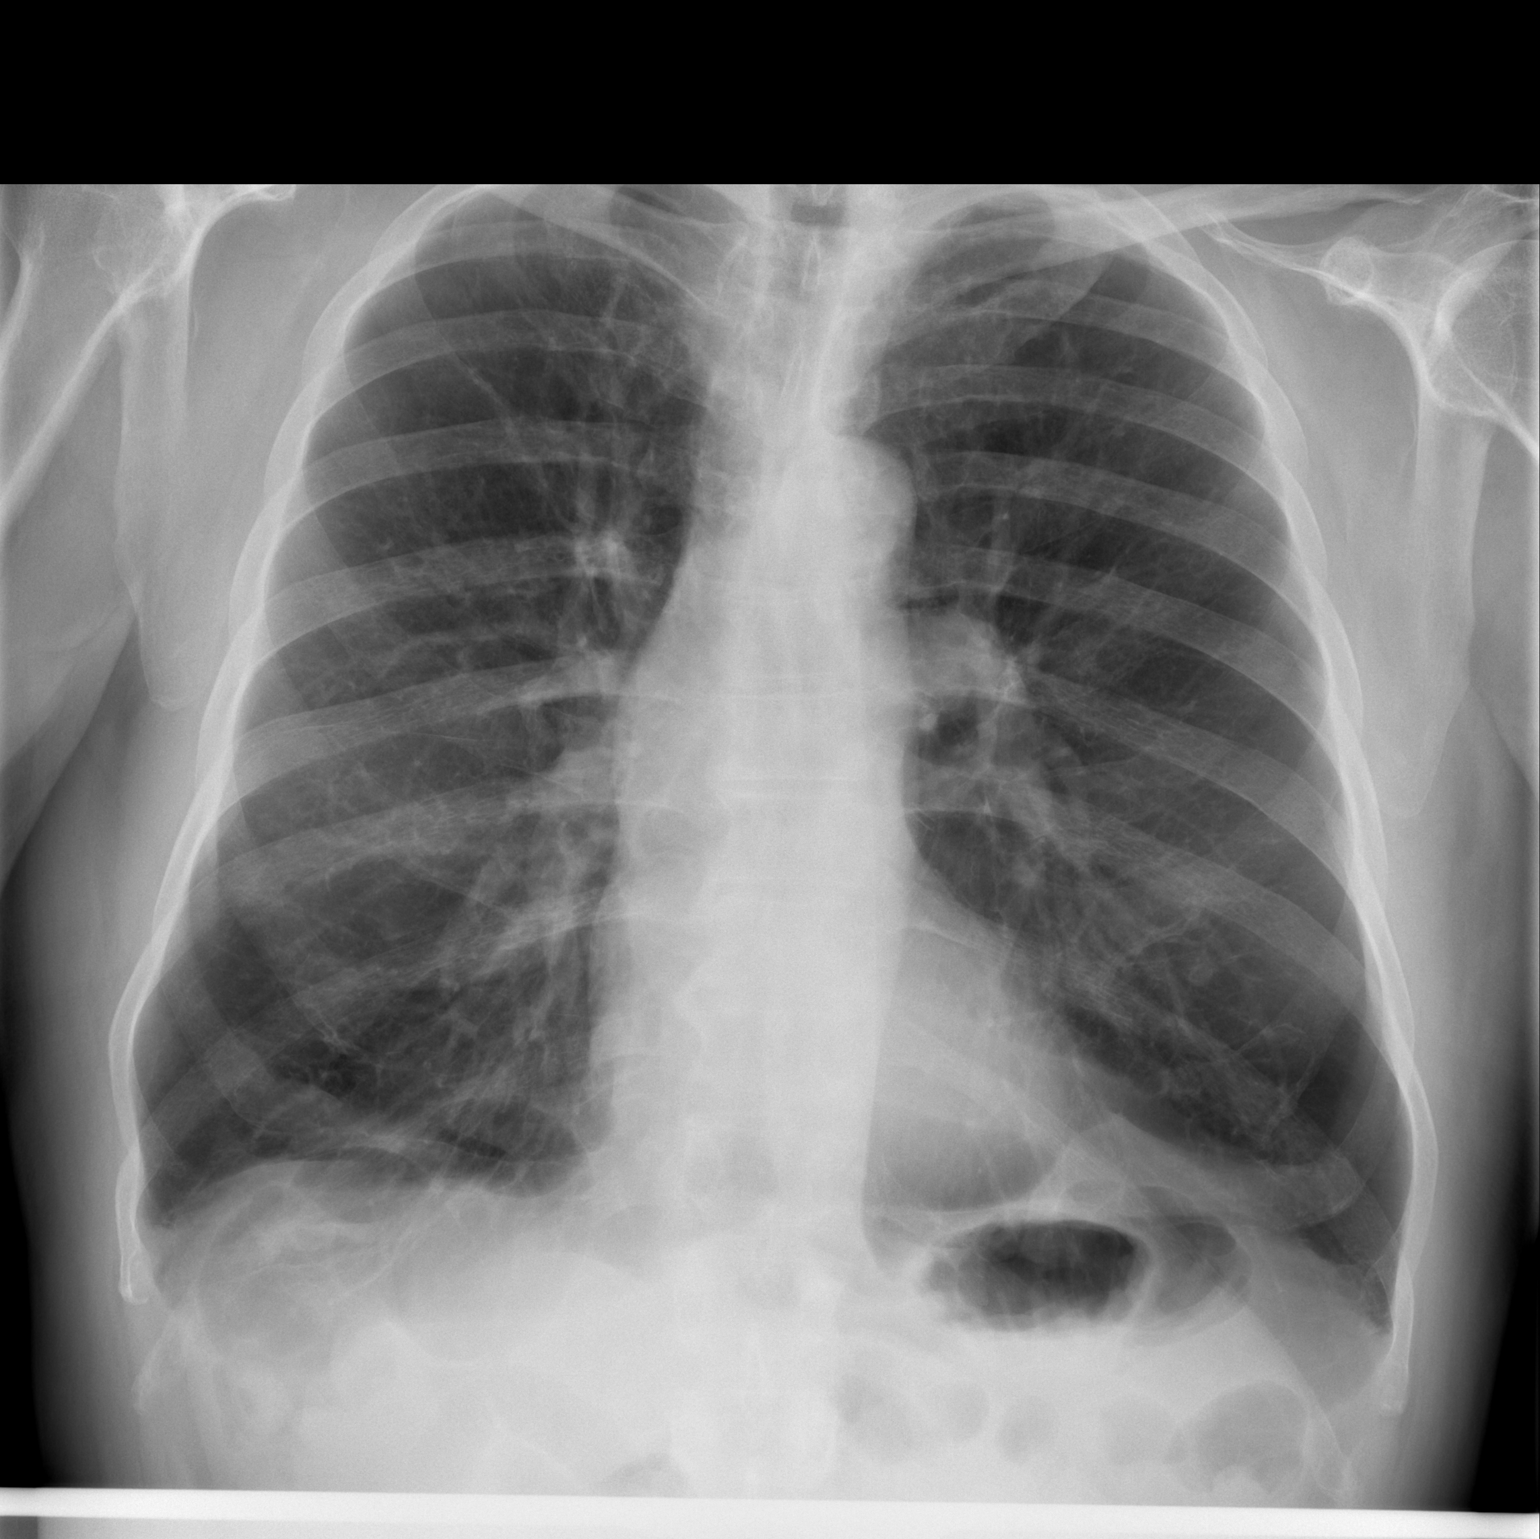

[w chest lat]
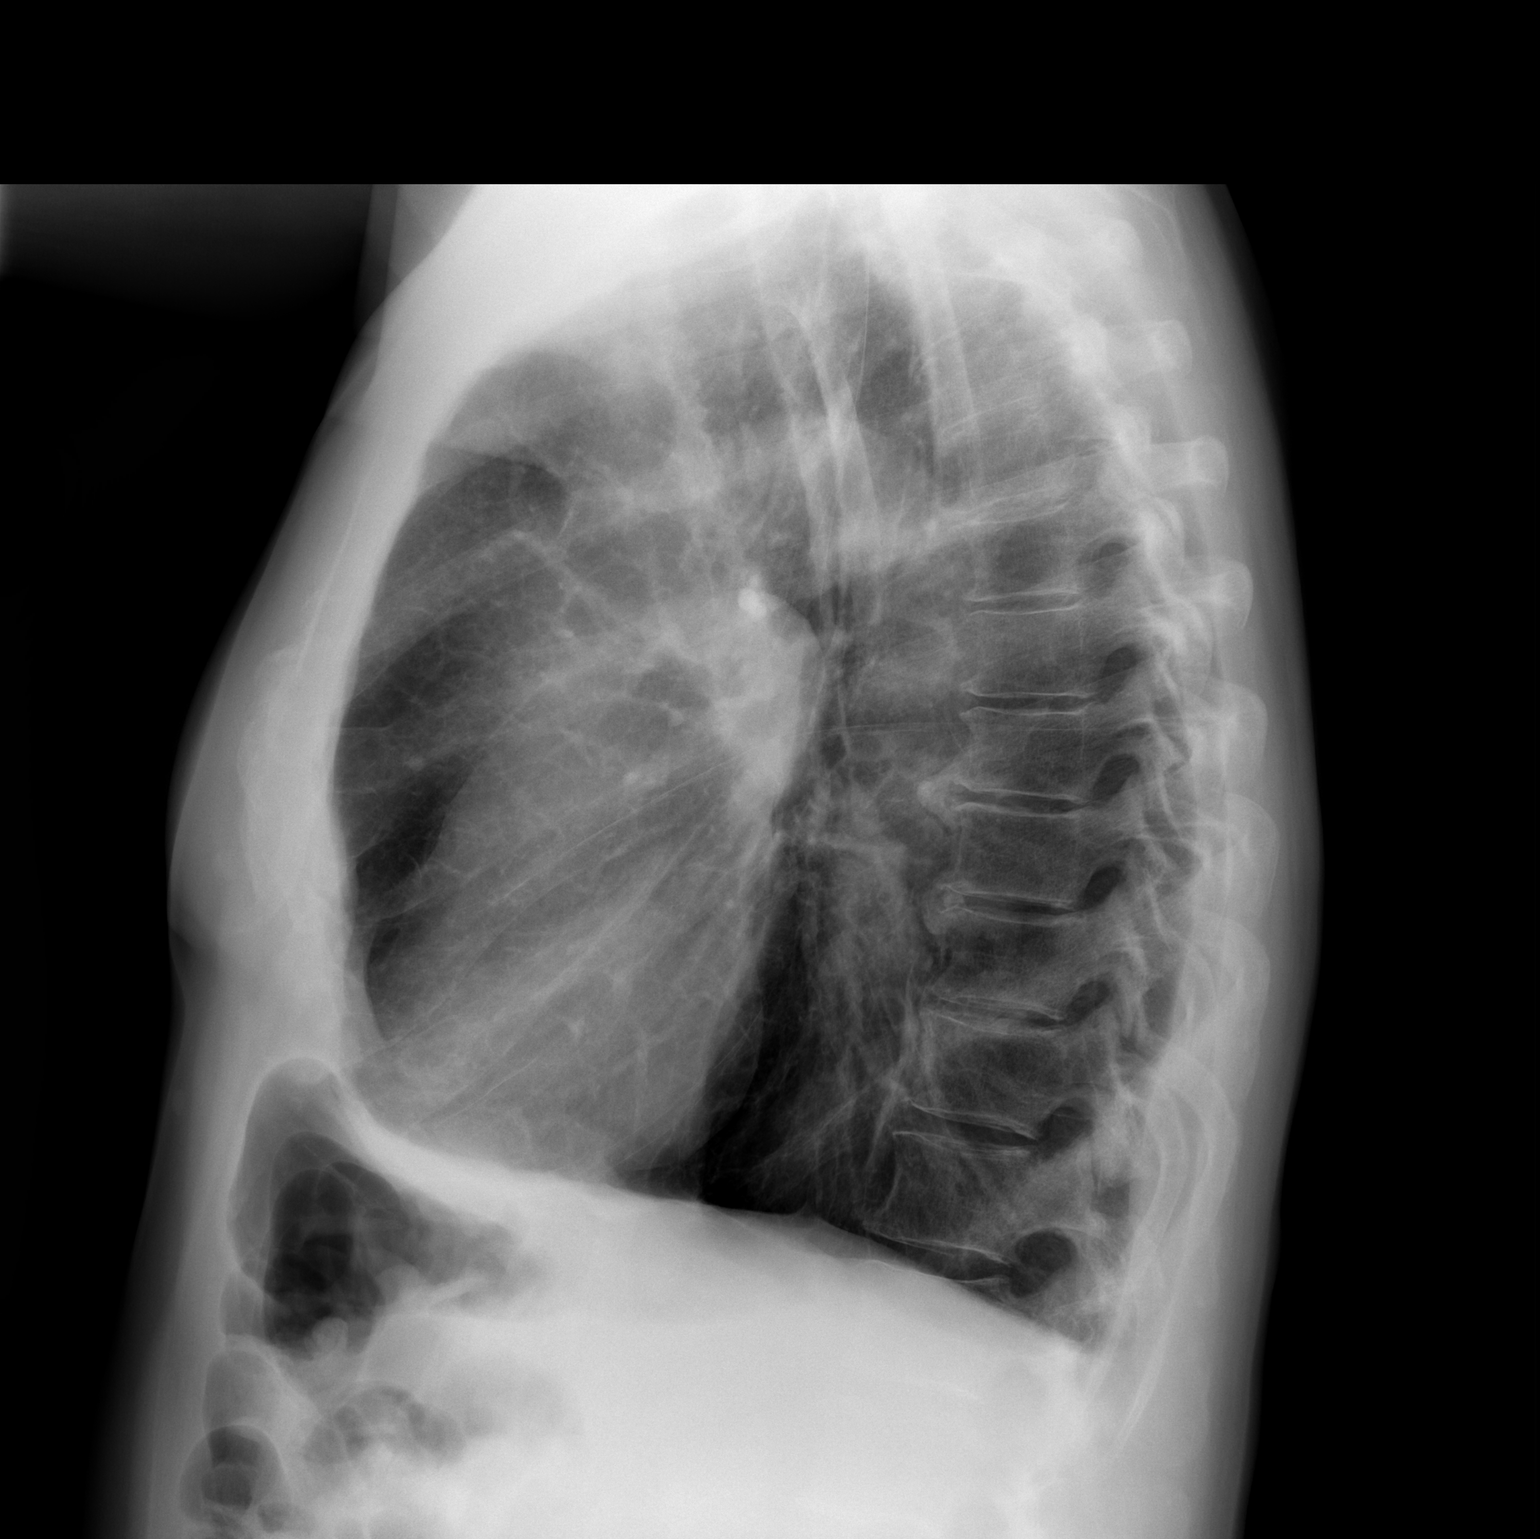

[2 of 2 positions shown; findings below may reference images not displayed]

FINDINGS: There is underlying emphysema.  There is a nipple shadow
on the left.  There is no edema or consolidation.  The heart size
and pulmonary vascularity are within normal limits.  No adenopathy.
There is degenerative change in the mid thoracic spine.
IMPRESSION: Underlying emphysema.  No edema or consolidation.  Nipple shadow
noted on the left.

## 2014-10-18 ENCOUNTER — Other Ambulatory Visit: Payer: Self-pay | Admitting: Family Medicine

## 2014-10-18 NOTE — Telephone Encounter (Signed)
Medication filled x1 with no refills.   Requires office visit before any further refills can be given.   Letter sent.  

## 2014-10-28 ENCOUNTER — Telehealth: Payer: Self-pay | Admitting: Family Medicine

## 2014-10-28 NOTE — Telephone Encounter (Signed)
See if he qualifies for [employer] help Okay to give samples

## 2014-10-28 NOTE — Telephone Encounter (Signed)
Pt in "[facility]"  Can not pay for his Albuterol inhaler or his Symbicort.  States has not used his Symbicort in about 3 months due to high cost.  Offered him sample of Symbicort Inhaler for now.

## 2014-10-31 NOTE — Telephone Encounter (Signed)
I dont actually know this patient, but he can try to apply for additional help

## 2014-10-31 NOTE — Telephone Encounter (Signed)
PPA for low income patients.  If in "donut hole" his plan  is not low income.  Do you think he would qualify for low income?  He has to call Medicare and apply for that.  They won't offer. Patient who qualify for low income Medicare never have to worry about "donut hole"

## 2014-11-21 ENCOUNTER — Encounter: Payer: Self-pay | Admitting: Family Medicine

## 2014-11-21 ENCOUNTER — Ambulatory Visit (INDEPENDENT_AMBULATORY_CARE_PROVIDER_SITE_OTHER): Payer: Commercial Managed Care - HMO | Admitting: Family Medicine

## 2014-11-21 ENCOUNTER — Other Ambulatory Visit: Payer: Self-pay | Admitting: Family Medicine

## 2014-11-21 VITALS — BP 130/68 | HR 72 | Temp 98.1°F | Resp 18 | Ht 69.0 in | Wt 152.0 lb

## 2014-11-21 DIAGNOSIS — Z0000 Encounter for general adult medical examination without abnormal findings: Secondary | ICD-10-CM

## 2014-11-21 LAB — COMPLETE METABOLIC PANEL WITH GFR
ALBUMIN: 4 g/dL (ref 3.5–5.2)
ALT: 14 U/L (ref 0–53)
AST: 17 U/L (ref 0–37)
Alkaline Phosphatase: 75 U/L (ref 39–117)
BUN: 26 mg/dL — AB (ref 6–23)
CALCIUM: 9.7 mg/dL (ref 8.4–10.5)
CHLORIDE: 109 meq/L (ref 96–112)
CO2: 26 mEq/L (ref 19–32)
CREATININE: 1.04 mg/dL (ref 0.50–1.35)
GFR, EST NON AFRICAN AMERICAN: 72 mL/min
GFR, Est African American: 84 mL/min
GLUCOSE: 94 mg/dL (ref 70–99)
POTASSIUM: 4.2 meq/L (ref 3.5–5.3)
Sodium: 140 mEq/L (ref 135–145)
Total Bilirubin: 0.4 mg/dL (ref 0.2–1.2)
Total Protein: 6.5 g/dL (ref 6.0–8.3)

## 2014-11-21 LAB — CBC WITH DIFFERENTIAL/PLATELET
BASOS ABS: 0 10*3/uL (ref 0.0–0.1)
BASOS PCT: 0 % (ref 0–1)
Eosinophils Absolute: 0.1 10*3/uL (ref 0.0–0.7)
Eosinophils Relative: 2 % (ref 0–5)
HCT: 36.7 % — ABNORMAL LOW (ref 39.0–52.0)
Hemoglobin: 11.9 g/dL — ABNORMAL LOW (ref 13.0–17.0)
Lymphocytes Relative: 22 % (ref 12–46)
Lymphs Abs: 1.3 10*3/uL (ref 0.7–4.0)
MCH: 28.5 pg (ref 26.0–34.0)
MCHC: 32.4 g/dL (ref 30.0–36.0)
MCV: 88 fL (ref 78.0–100.0)
MPV: 10.1 fL (ref 9.4–12.4)
Monocytes Absolute: 0.6 10*3/uL (ref 0.1–1.0)
Monocytes Relative: 10 % (ref 3–12)
NEUTROS ABS: 3.9 10*3/uL (ref 1.7–7.7)
NEUTROS PCT: 66 % (ref 43–77)
PLATELETS: 245 10*3/uL (ref 150–400)
RBC: 4.17 MIL/uL — AB (ref 4.22–5.81)
RDW: 14.3 % (ref 11.5–15.5)
WBC: 5.9 10*3/uL (ref 4.0–10.5)

## 2014-11-21 LAB — LIPID PANEL
CHOLESTEROL: 150 mg/dL (ref 0–200)
HDL: 47 mg/dL (ref >39)
LDL CALC: 93 mg/dL (ref 0–99)
Total CHOL/HDL Ratio: 3.2 Ratio
Triglycerides: 50 mg/dL (ref <150)
VLDL: 10 mg/dL (ref 0–40)

## 2014-11-21 NOTE — Progress Notes (Signed)
Subjective:    Patient ID: William Villegas, male    DOB: October 01, 1946, 68 y.o.   MRN: [id number]  HPI Patient is  Here today for a physical exam. Patient quit smoking this year. I'm extremely proud of the patient for doing this. He is overdue for prostate exam. He refuses a digital rectal exam. He refuses another colonoscopy. He refuses a flu shot. He did receive a pneumonia vaccine last year (04/18/11).  I will need to update Epic.  He is overdue for fasting lab work. Patient is currently in the doughnut hole. Because of this he has not been using his Symbicort regularly. He is asking for samples of the Symbicort. He is also been using his albuterol sparingly. At the present time he needs the albuterol 1-2 times a day. Past Medical History  Diagnosis Date  . COPD (chronic obstructive pulmonary disease)   . Hypertension    No past surgical history on file. Current Outpatient Prescriptions on File Prior to Visit  Medication Sig Dispense Refill  . albuterol (PROVENTIL HFA;VENTOLIN HFA) 108 (90 BASE) MCG/ACT inhaler Inhale 2 puffs into the lungs every 6 (six) hours as needed for wheezing. 3 Inhaler 1  . aspirin 81 MG tablet Take 81 mg by mouth daily.    . budesonide-formoterol (SYMBICORT) 160-4.5 MCG/ACT inhaler Inhale 2 puffs into the lungs 2 (two) times daily. 3 Inhaler 1  . lisinopril (PRINIVIL,ZESTRIL) 20 MG tablet TAKE 1 TABLET EVERY DAY 90 tablet 1  . pravastatin (PRAVACHOL) 40 MG tablet TAKE 1 TABLET EVERY DAY 90 tablet 0  . PROVENTIL HFA 108 (90 BASE) MCG/ACT inhaler INHALE 2 PUFFS EVERY 6 HOURS AS NEEDED  FOR  WHEEZING 3 Inhaler 1   No current facility-administered medications on file prior to visit.   No Known Allergies History   Social History  . Marital Status: Divorced    Spouse Name: N/A    Number of Children: N/A  . Years of Education: N/A   Occupational History  . Not on file.   Social History Main Topics  . Smoking status: Current Some Day Smoker -- 0.50 packs/day  for 25 years    Types: Cigarettes  . Smokeless tobacco: Not on file  . Alcohol Use: No  . Drug Use: No  . Sexual Activity: Not on file   Other Topics Concern  . Not on file   Social History Narrative   No family history on file.    Review of Systems  All other systems reviewed and are negative.      Objective:   Physical Exam  Constitutional: He is oriented to person, place, and time. He appears well-developed and well-nourished. No distress.  HENT:  Head: Normocephalic and atraumatic.  Right Ear: External ear normal.  Left Ear: External ear normal.  Nose: Nose normal.  Mouth/Throat: Oropharynx is clear and moist. No oropharyngeal exudate.  Eyes: Conjunctivae and EOM are normal. Pupils are equal, round, and reactive to light. Right eye exhibits no discharge. Left eye exhibits no discharge. No scleral icterus.  Neck: Normal range of motion. Neck supple. No JVD present. No tracheal deviation present. No thyromegaly present.  Cardiovascular: Normal rate, regular rhythm, normal heart sounds and intact distal pulses.  Exam reveals no gallop.   No murmur heard. Pulmonary/Chest: Effort normal. No stridor. No respiratory distress. He has decreased breath sounds. He has wheezes.  Abdominal: Soft. Bowel sounds are normal. He exhibits no distension and no mass. There is no tenderness. There is no rebound  and no guarding.  Musculoskeletal: Normal range of motion. He exhibits no edema or tenderness.  Lymphadenopathy:    He has no cervical adenopathy.  Neurological: He is alert and oriented to person, place, and time. He has normal reflexes. He displays normal reflexes. No cranial nerve deficit. He exhibits normal muscle tone. Coordination normal.  Skin: Skin is warm. No rash noted. He is not diaphoretic. No erythema. No pallor.  Psychiatric: He has a normal mood and affect. His behavior is normal. Judgment and thought content normal.  Vitals reviewed. patient refuses a rectal exam and  prostate exam.        Assessment & Plan:  Routine general medical examination at a health care facility - Plan: COMPLETE METABOLIC PANEL WITH GFR, CBC with Differential, PSA, Medicare, Lipid panel  Patient refuses a colonoscopy. He refuses a prostate exam. I will check a PSA. I will also check a CBC, CMP, fasting lipid panel. Patient refuses a flu shot. I congratulated the patient on smoking cessation. Regular anticipatory guidance is provided. I gave him samples of the Symbicort to last until next year until he is out of the donut hole.

## 2014-11-22 LAB — PSA, MEDICARE: PSA: 2.76 ng/mL (ref <=4.00)

## 2014-11-24 ENCOUNTER — Telehealth: Payer: Self-pay | Admitting: Family Medicine

## 2014-11-24 DIAGNOSIS — D649 Anemia, unspecified: Secondary | ICD-10-CM

## 2014-11-24 LAB — VITAMIN B12: Vitamin B-12: 316 pg/mL (ref 211–911)

## 2014-11-24 LAB — FERRITIN: Ferritin: 57 ng/mL (ref 22–322)

## 2014-11-24 NOTE — Telephone Encounter (Signed)
-----   Message from Susy Frizzle, MD sent at 11/22/2014  7:12 AM EST ----- Labs are good except hgb is slightly low.  I would like to add ferritin, b12, and stool cards x 3.

## 2014-11-24 NOTE — Telephone Encounter (Signed)
Lab notified to add ferritin and B12.    Called patient told about hemoccult cards.  He is to come by and get.

## 2014-12-06 ENCOUNTER — Other Ambulatory Visit: Payer: Medicare HMO

## 2014-12-06 ENCOUNTER — Other Ambulatory Visit: Payer: Self-pay | Admitting: Family Medicine

## 2014-12-06 DIAGNOSIS — D649 Anemia, unspecified: Secondary | ICD-10-CM

## 2014-12-07 LAB — FECAL OCCULT BLOOD, IMMUNOCHEMICAL
FECAL OCCULT BLOOD: NEGATIVE
FECAL OCCULT BLOOD: NEGATIVE
Fecal Occult Blood: NEGATIVE

## 2014-12-08 ENCOUNTER — Encounter: Payer: Self-pay | Admitting: *Deleted

## 2014-12-16 ENCOUNTER — Telehealth: Payer: Self-pay | Admitting: Family Medicine

## 2014-12-22 ENCOUNTER — Telehealth: Payer: Self-pay | Admitting: *Deleted

## 2014-12-22 MED ORDER — ALBUTEROL SULFATE HFA 108 (90 BASE) MCG/ACT IN AERS: 2.0000 | INHALATION_SPRAY | Freq: Four times a day (QID) | RESPIRATORY_TRACT | Status: DC | PRN

## 2014-12-22 MED ORDER — BUDESONIDE-FORMOTEROL FUMARATE 160-4.5 MCG/ACT IN AERO: 2.0000 | INHALATION_SPRAY | Freq: Two times a day (BID) | RESPIRATORY_TRACT | Status: DC

## 2014-12-22 NOTE — Telephone Encounter (Signed)
Pt called stating needing refills on albuterol and symbicort, pt was last seen 11/2014, was in donut hole, says he changed insurance and should cover, I refilled both meds to [facility]

## 2014-12-27 ENCOUNTER — Encounter: Payer: Self-pay | Admitting: *Deleted

## 2014-12-28 ENCOUNTER — Other Ambulatory Visit: Payer: Self-pay | Admitting: Family Medicine

## 2014-12-28 MED ORDER — PRAVASTATIN SODIUM 40 MG PO TABS: 40.0000 mg | ORAL_TABLET | Freq: Every day | ORAL | Status: DC

## 2014-12-28 MED ORDER — ALBUTEROL SULFATE HFA 108 (90 BASE) MCG/ACT IN AERS: 2.0000 | INHALATION_SPRAY | Freq: Four times a day (QID) | RESPIRATORY_TRACT | Status: DC | PRN

## 2014-12-28 MED ORDER — BUDESONIDE-FORMOTEROL FUMARATE 160-4.5 MCG/ACT IN AERO: 2.0000 | INHALATION_SPRAY | Freq: Two times a day (BID) | RESPIRATORY_TRACT | Status: DC

## 2014-12-28 MED ORDER — LISINOPRIL 20 MG PO TABS: 20.0000 mg | ORAL_TABLET | Freq: Every day | ORAL | Status: DC

## 2014-12-28 NOTE — Telephone Encounter (Signed)
Changed Proventil to Ventolin - as per formulary should be cheaper. Meds sent to pharm.

## 2015-01-16 ENCOUNTER — Telehealth: Payer: Self-pay | Admitting: Family Medicine

## 2015-01-16 MED ORDER — PRAVASTATIN SODIUM 40 MG PO TABS: 40.0000 mg | ORAL_TABLET | Freq: Every day | ORAL | Status: DC

## 2015-01-16 MED ORDER — LISINOPRIL 20 MG PO TABS: 20.0000 mg | ORAL_TABLET | Freq: Every day | ORAL | Status: DC

## 2015-01-16 NOTE — Telephone Encounter (Signed)
Patient says we have been giving him lisinopril and pravastatin would like to know if these can be called in for him  To [place] [facility]  [phone]

## 2015-01-16 NOTE — Telephone Encounter (Signed)
Med sent to pharm 

## 2015-03-23 ENCOUNTER — Other Ambulatory Visit: Payer: Self-pay | Admitting: Family Medicine

## 2015-03-23 MED ORDER — ALBUTEROL SULFATE HFA 108 (90 BASE) MCG/ACT IN AERS: 2.0000 | INHALATION_SPRAY | Freq: Four times a day (QID) | RESPIRATORY_TRACT | Status: DC | PRN

## 2015-03-23 NOTE — Telephone Encounter (Signed)
Patient is calling to get refill on his ventolin if possible  [phone] [facility] [place]

## 2015-03-23 NOTE — Telephone Encounter (Signed)
Refill appropriate and filled per protocol. 

## 2015-04-14 ENCOUNTER — Other Ambulatory Visit: Payer: Self-pay | Admitting: Family Medicine

## 2015-05-31 ENCOUNTER — Telehealth: Payer: Self-pay | Admitting: Family Medicine

## 2015-05-31 MED ORDER — BUDESONIDE-FORMOTEROL FUMARATE 160-4.5 MCG/ACT IN AERO: 2.0000 | INHALATION_SPRAY | Freq: Two times a day (BID) | RESPIRATORY_TRACT | Status: DC

## 2015-05-31 MED ORDER — ALBUTEROL SULFATE HFA 108 (90 BASE) MCG/ACT IN AERS: 2.0000 | INHALATION_SPRAY | Freq: Four times a day (QID) | RESPIRATORY_TRACT | Status: DC | PRN

## 2015-05-31 NOTE — Telephone Encounter (Signed)
Pharmacy told him to call us regarding getting his albuterol and Symbicort refilled because there were no refills  [phone] [facility] [place]

## 2015-05-31 NOTE — Telephone Encounter (Signed)
Medication called/sent to requested pharmacy  

## 2015-07-12 ENCOUNTER — Other Ambulatory Visit: Payer: Self-pay | Admitting: Family Medicine

## 2015-08-29 IMAGING — DX DG CHEST 1V PORT
2 series · 2 of 2 positions shown · non-contrast
Comparison: 10/06/2012

CLINICAL DATA: Shortness of Breath. COPD and hypertension.

EXAM:
PORTABLE CHEST - 1 VIEW

[portable (1 of 2)]
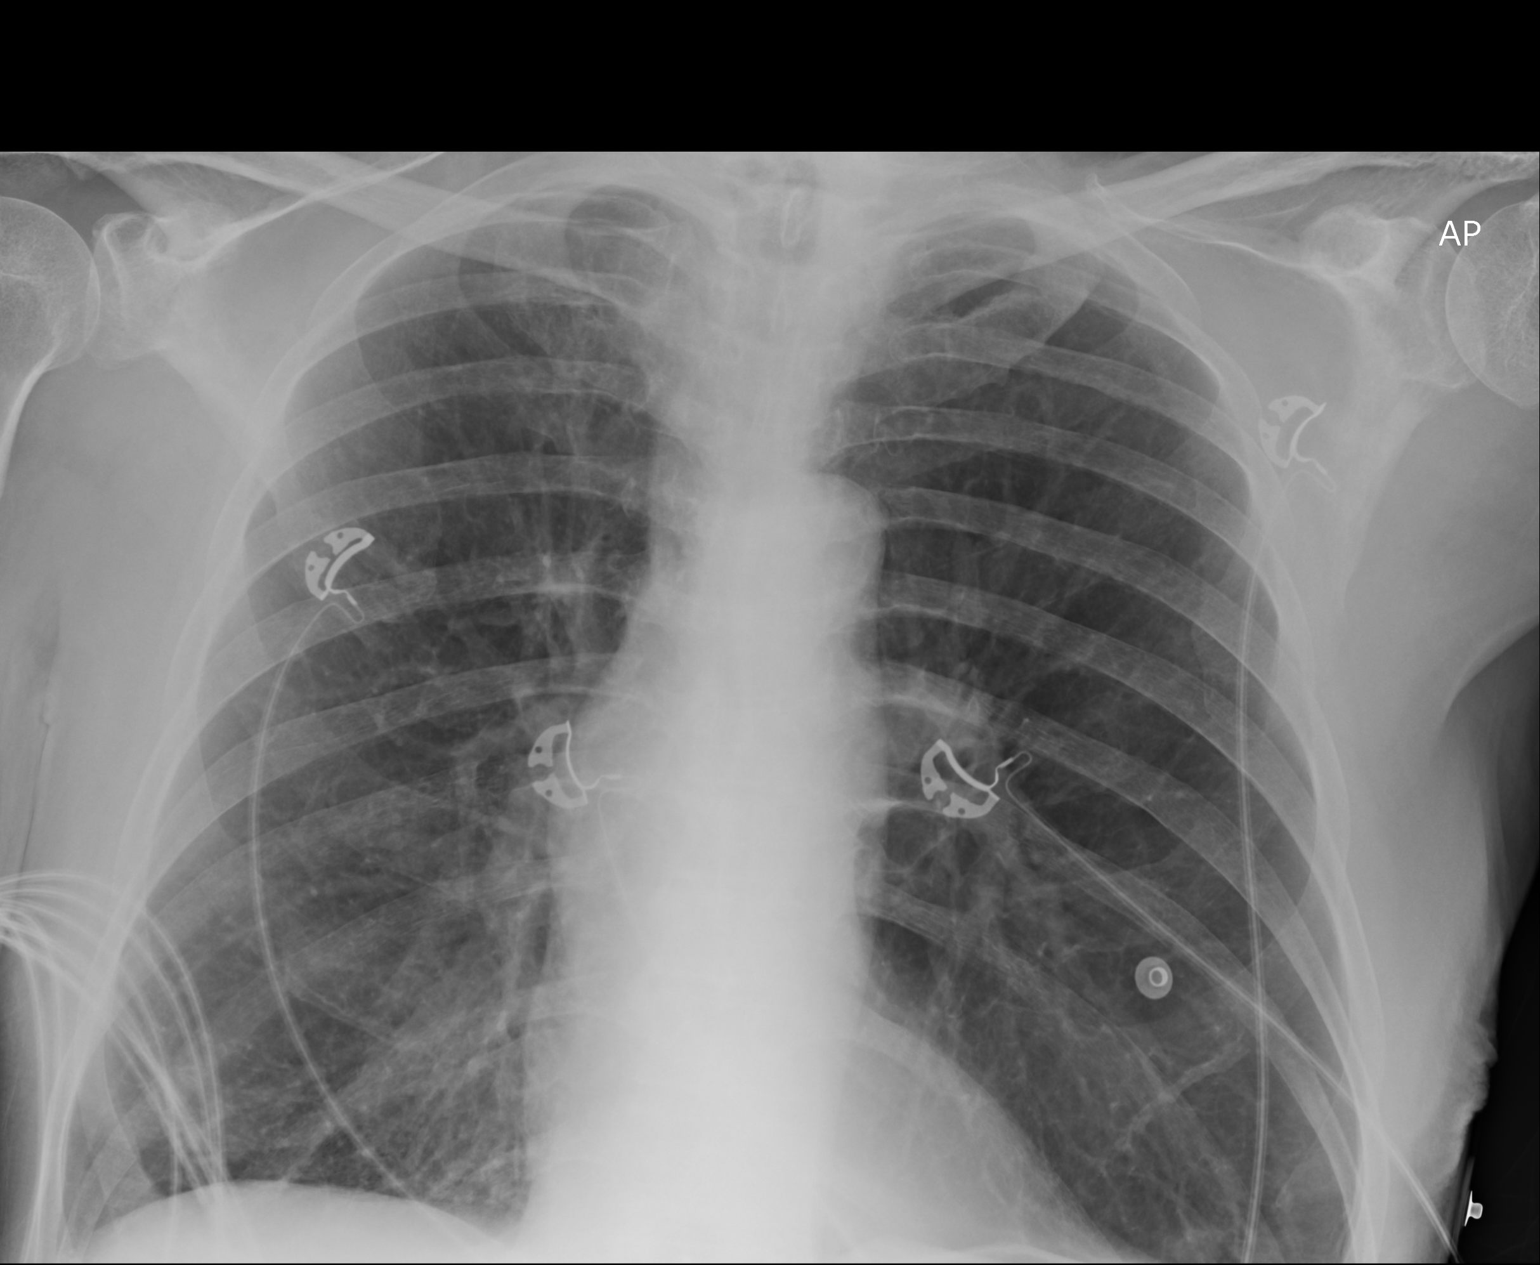

[portable (2 of 2)]
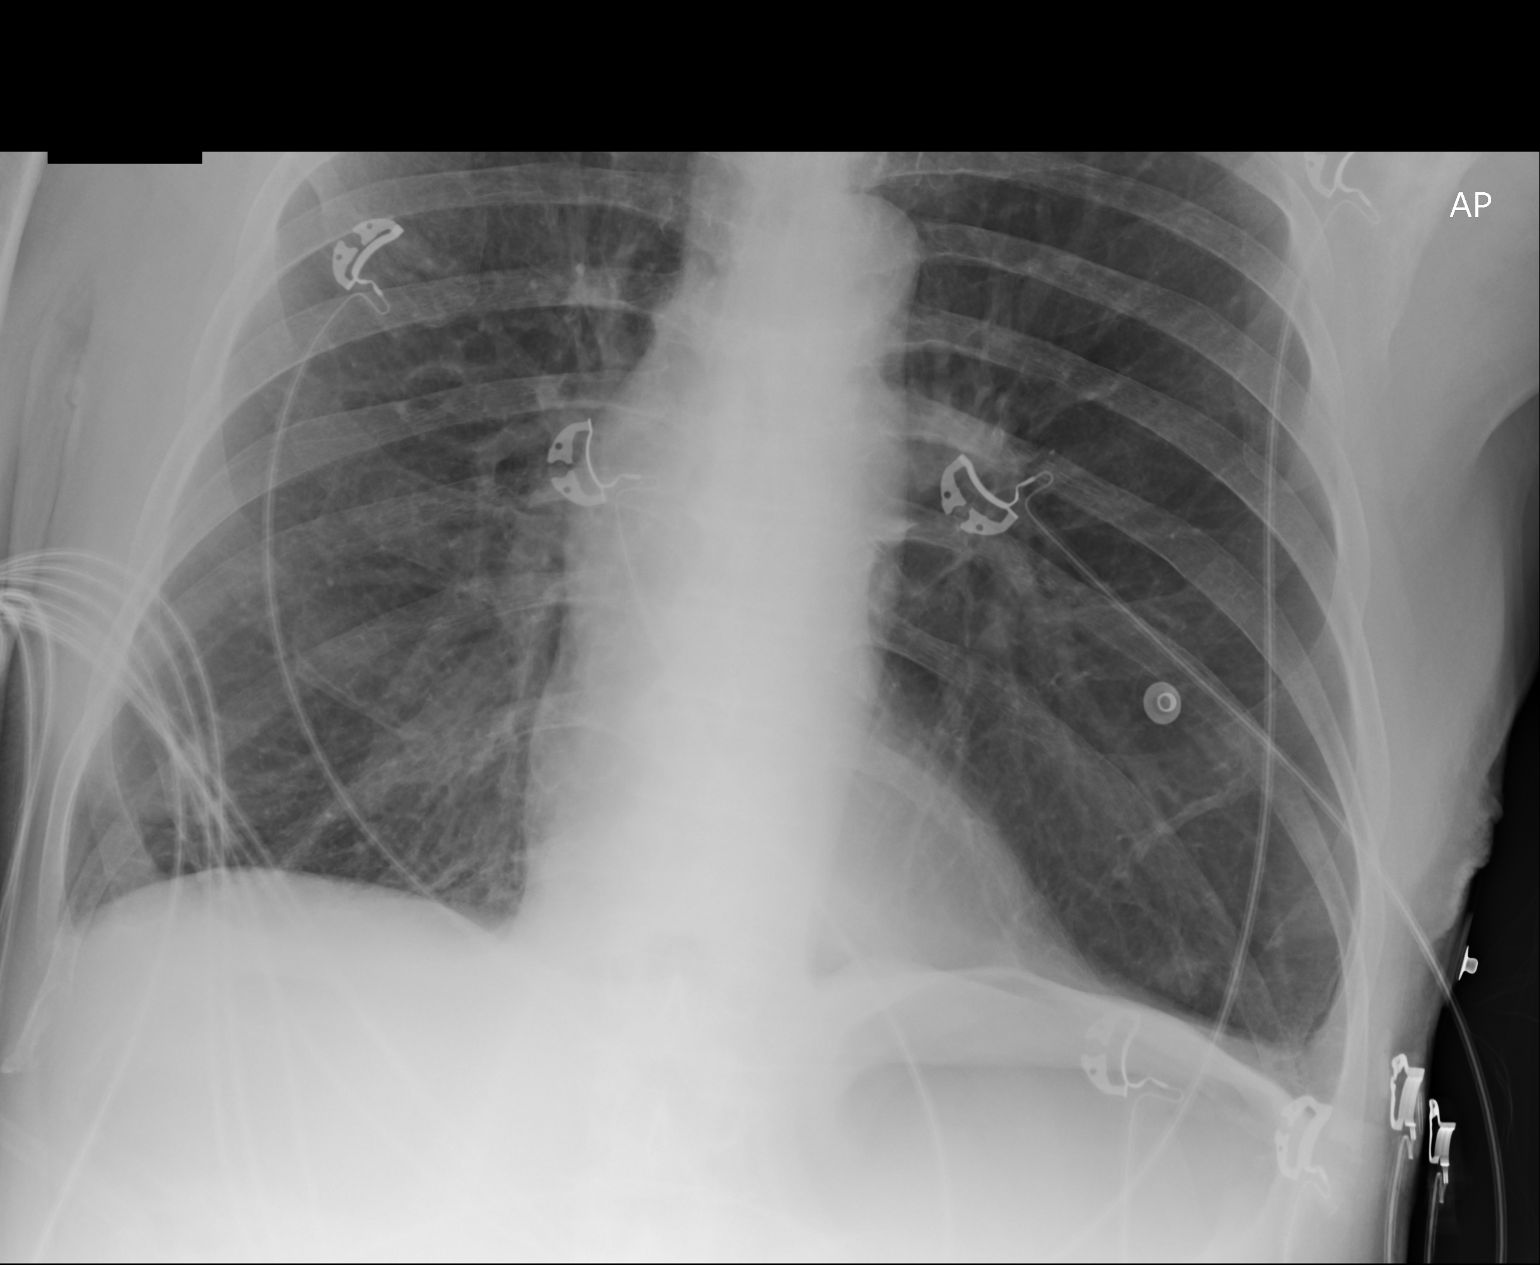

[2 of 2 positions shown; findings below may reference images not displayed]

FINDINGS: COPD/hyperinflation. Multiple leads and wires overlie the chest.
Midline trachea. Normal heart size with tortuous thoracic aorta.
Mild pleural thickening blunts the costophrenic angles bilaterally.
No pneumothorax. No lobar consolidation.
IMPRESSION: COPD/hyperinflation, without acute superimposed process.

## 2015-10-03 ENCOUNTER — Encounter: Payer: Self-pay | Admitting: Family Medicine

## 2015-10-03 ENCOUNTER — Ambulatory Visit (INDEPENDENT_AMBULATORY_CARE_PROVIDER_SITE_OTHER): Payer: Commercial Managed Care - HMO | Admitting: Family Medicine

## 2015-10-03 VITALS — BP 118/70 | HR 20 | Temp 97.7°F | Resp 101 | Wt 137.0 lb

## 2015-10-03 DIAGNOSIS — J441 Chronic obstructive pulmonary disease with (acute) exacerbation: Secondary | ICD-10-CM | POA: Diagnosis not present

## 2015-10-03 DIAGNOSIS — J438 Other emphysema: Secondary | ICD-10-CM

## 2015-10-03 MED ORDER — PREDNISONE 20 MG PO TABS: 40.0000 mg | ORAL_TABLET | Freq: Every day | ORAL | Status: DC

## 2015-10-03 NOTE — Progress Notes (Signed)
   Subjective:    Patient ID: William Villegas, male    DOB: [birth year], 69 y.o.   MRN: [id number]  HPI Patient states that he does not have a cough. However he reports increasing shortness of breath and dyspnea. He has not been using Symbicort as prescribed. He states that he does 1 puff 3 times a day he has a difficult time affording the medication. He is using albuterol nebulizers 2-3 times a day. On examination today he has very distant breath sounds, audible wheezing, decreased air flow throughout. He denies any angina or chest pain. He denies any fevers or chills. He denies any mucopurulent cough. Past Medical History  Diagnosis Date  . COPD (chronic obstructive pulmonary disease) ([place])   . Hypertension    No past surgical history on file. Current Outpatient Prescriptions on File Prior to Visit  Medication Sig Dispense Refill  . aspirin 81 MG tablet Take 81 mg by mouth daily.    . budesonide-formoterol (SYMBICORT) 160-4.5 MCG/ACT inhaler Inhale 2 puffs into the lungs 2 (two) times daily. 3 Inhaler 1  . lisinopril (PRINIVIL,ZESTRIL) 20 MG tablet TAKE 1 TABLET BY MOUTH DAILY 90 tablet 1  . pravastatin (PRAVACHOL) 40 MG tablet TAKE 1 TABLET BY MOUTH DAILY 90 tablet 0  . albuterol (PROVENTIL HFA;VENTOLIN HFA) 108 (90 BASE) MCG/ACT inhaler Inhale 2 puffs into the lungs every 6 (six) hours as needed for wheezing. (Patient not taking: Reported on 10/03/2015) 3 Inhaler 1   No current facility-administered medications on file prior to visit.   No Known Allergies Social History   Social History  . Marital Status: Divorced    Spouse Name: N/A  . Number of Children: N/A  . Years of Education: N/A   Occupational History  . Not on file.   Social History Main Topics  . Smoking status: Current Some Day Smoker -- 0.50 packs/day for 25 years    Types: Cigarettes  . Smokeless tobacco: Not on file  . Alcohol Use: No  . Drug Use: No  . Sexual Activity: Not on file   Other Topics Concern  .  Not on file   Social History Narrative      Review of Systems  All other systems reviewed and are negative.      Objective:   Physical Exam  Neck: Neck supple.  Cardiovascular: Normal rate, regular rhythm and normal heart sounds.   Pulmonary/Chest: Effort normal. He has decreased breath sounds. He has wheezes.  Abdominal: Soft. Bowel sounds are normal.  Vitals reviewed.         Assessment & Plan:  COPD exacerbation ([place])  Other emphysema ([place])  Patient was given a duoneb x 1 today in plinic.  Begin prednisone 40 mg by mouth daily for 7 days. Discontinue Symbicort and switch to anoro 1 inh qday.  Continue albuterol 2-3 times a day as needed. Recheck in one week.  Given the lack of mucopurulent cough, I see no indication for antibiotics.

## 2015-10-07 ENCOUNTER — Other Ambulatory Visit: Payer: Self-pay | Admitting: Family Medicine

## 2015-10-10 ENCOUNTER — Ambulatory Visit
Admission: RE | Admit: 2015-10-10 | Discharge: 2015-10-10 | Disposition: A | Discharge status: Home / Self Care | Payer: Medicare HMO | Source: Ambulatory Visit | Attending: Family Medicine | Admitting: Family Medicine

## 2015-10-10 ENCOUNTER — Encounter: Payer: Self-pay | Admitting: Family Medicine

## 2015-10-10 ENCOUNTER — Ambulatory Visit (INDEPENDENT_AMBULATORY_CARE_PROVIDER_SITE_OTHER): Payer: Commercial Managed Care - HMO | Admitting: Family Medicine

## 2015-10-10 VITALS — BP 132/68 | HR 82 | Temp 98.0°F | Resp 20 | Wt 149.0 lb

## 2015-10-10 DIAGNOSIS — J441 Chronic obstructive pulmonary disease with (acute) exacerbation: Secondary | ICD-10-CM | POA: Diagnosis not present

## 2015-10-10 NOTE — Progress Notes (Signed)
Subjective:    Patient ID: William Villegas, male    DOB: 10/21/1946, 69 y.o.   MRN: [id number]  HPI 10/04/15 Patient states that he does not have a cough. However he reports increasing shortness of breath and dyspnea. He has not been using Symbicort as prescribed. He states that he does use it 1 puff 3 times a day.  He has a difficult time affording the medication. He is using albuterol nebulizers 2-3 times a day. On examination today he has very distant breath sounds, audible wheezing, decreased air flow throughout. He denies any angina or chest pain. He denies any fevers or chills. He denies any mucopurulent cough.  At that time, my plan was: Patient was given a duoneb x 1 today in clinic.  Begin prednisone 40 mg by mouth daily for 7 days. Discontinue Symbicort and switch to anoro 1 inh qday.  Continue albuterol 2-3 times a day as needed. Recheck in one week.  Given the lack of mucopurulent cough, I see no indication for antibiotics.  10/10/15 Here for follow up.  Patient states that his breathing is not much better. He is 96% on room air. With ambulation he drops to 90% and begins to wheeze audibly.  He denies any cough. He denies a productive cough. He denies any angina or chest pain. He denies any fever. Today his lungs actually sound noticeably better to me. He has better air movement.  There are faint right basilar crackles. Past Medical History  Diagnosis Date  . COPD (chronic obstructive pulmonary disease) ([place])   . Hypertension    No past surgical history on file. Current Outpatient Prescriptions on File Prior to Visit  Medication Sig Dispense Refill  . albuterol (PROVENTIL HFA;VENTOLIN HFA) 108 (90 BASE) MCG/ACT inhaler Inhale 2 puffs into the lungs every 6 (six) hours as needed for wheezing. (Patient not taking: Reported on 10/03/2015) 3 Inhaler 1  . aspirin 81 MG tablet Take 81 mg by mouth daily.    . budesonide-formoterol (SYMBICORT) 160-4.5 MCG/ACT inhaler Inhale 2 puffs into  the lungs 2 (two) times daily. 3 Inhaler 1  . lisinopril (PRINIVIL,ZESTRIL) 20 MG tablet TAKE 1 TABLET BY MOUTH DAILY 90 tablet 1  . pravastatin (PRAVACHOL) 40 MG tablet TAKE 1 TABLET BY MOUTH DAILY 90 tablet 1  . predniSONE (DELTASONE) 20 MG tablet Take 2 tablets (40 mg total) by mouth daily with breakfast. 14 tablet 0   No current facility-administered medications on file prior to visit.   No Known Allergies Social History   Social History  . Marital Status: Divorced    Spouse Name: N/A  . Number of Children: N/A  . Years of Education: N/A   Occupational History  . Not on file.   Social History Main Topics  . Smoking status: Current Some Day Smoker -- 0.50 packs/day for 25 years    Types: Cigarettes  . Smokeless tobacco: Not on file  . Alcohol Use: No  . Drug Use: No  . Sexual Activity: Not on file   Other Topics Concern  . Not on file   Social History Narrative      Review of Systems  All other systems reviewed and are negative.      Objective:   Physical Exam  Neck: Neck supple.  Cardiovascular: Normal rate, regular rhythm and normal heart sounds.   Pulmonary/Chest: Effort normal. He has decreased breath sounds. He has wheezes.  Abdominal: Soft. Bowel sounds are normal.  Vitals reviewed.  Assessment & Plan:  COPD exacerbation ([place]) - Plan: DG Chest 2 View  Continue anoro 1 inhalation daily. He has not been using albuterol with any regularity. On average he uses it once a day. I believe that he liberalize the albuterol his breathing would symptomatically improved. I would like him to start using albuterol 3 times a day. He does better with a nebulizer and therefore I will try to have albuterol 2.5 mg nebs every 8 hours arranged through [place].  Recheck in 1 week.  Obtain CXR.

## 2015-10-17 ENCOUNTER — Ambulatory Visit (INDEPENDENT_AMBULATORY_CARE_PROVIDER_SITE_OTHER): Payer: Commercial Managed Care - HMO | Admitting: Family Medicine

## 2015-10-17 ENCOUNTER — Encounter: Payer: Self-pay | Admitting: Family Medicine

## 2015-10-17 VITALS — BP 160/82 | HR 90 | Temp 98.1°F | Resp 20 | Wt 142.0 lb

## 2015-10-17 DIAGNOSIS — J441 Chronic obstructive pulmonary disease with (acute) exacerbation: Secondary | ICD-10-CM | POA: Diagnosis not present

## 2015-10-17 DIAGNOSIS — J438 Other emphysema: Secondary | ICD-10-CM

## 2015-10-17 MED ORDER — ALBUTEROL SULFATE (2.5 MG/3ML) 0.083% IN NEBU: 2.5000 mg | INHALATION_SOLUTION | Freq: Four times a day (QID) | RESPIRATORY_TRACT | Status: DC | PRN

## 2015-10-17 MED ORDER — UMECLIDINIUM-VILANTEROL 62.5-25 MCG/INH IN AEPB: 1.0000 | INHALATION_SPRAY | Freq: Every day | RESPIRATORY_TRACT | Status: DC

## 2015-10-17 NOTE — Progress Notes (Signed)
Subjective:    Patient ID: William Villegas, male    DOB: 1946-04-30, 69 y.o.   MRN: [id number]  HPI 10/04/15 Patient states that he does not have a cough. However he reports increasing shortness of breath and dyspnea. He has not been using Symbicort as prescribed. He states that he does use it 1 puff 3 times a day.  He has a difficult time affording the medication. He is using albuterol nebulizers 2-3 times a day. On examination today he has very distant breath sounds, audible wheezing, decreased air flow throughout. He denies any angina or chest pain. He denies any fevers or chills. He denies any mucopurulent cough.  At that time, my plan was: Patient was given a duoneb x 1 today in clinic.  Begin prednisone 40 mg by mouth daily for 7 days. Discontinue Symbicort and switch to anoro 1 inh qday.  Continue albuterol 2-3 times a day as needed. Recheck in one week.  Given the lack of mucopurulent cough, I see no indication for antibiotics.  10/10/15 Here for follow up.  Patient states that his breathing is not much better. He is 96% on room air. With ambulation he drops to 90% and begins to wheeze audibly.  He denies any cough. He denies a productive cough. He denies any angina or chest pain. He denies any fever. Today his lungs actually sound noticeably better to me. He has better air movement.  There are faint right basilar crackles.  AT that time, my plan was: Continue anoro 1 inhalation daily. He has not been using albuterol with any regularity. On average he uses it once a day. I believe that he liberalize the albuterol his breathing would symptomatically improved. I would like him to start using albuterol 3 times a day. He does better with a nebulizer and therefore I will try to have albuterol 2.5 mg nebs every 8 hours arranged through [facility].  Recheck in 1 week.  Obtain CXR.    CXR revealed copd changes.  10/17/15 Patient's breathing is doing better. Unfortunately he is started smoking again. He  has been compliant with anoro and albuterol nebs 3 times a day. His breathing is back to baseline. He has diminished breath sounds throughout. However there are no further expiratory wheezes. There are no further crackles. Past Medical History  Diagnosis Date  . COPD (chronic obstructive pulmonary disease) ([place])   . Hypertension    No past surgical history on file. Current Outpatient Prescriptions on File Prior to Visit  Medication Sig Dispense Refill  . albuterol (PROVENTIL HFA;VENTOLIN HFA) 108 (90 BASE) MCG/ACT inhaler Inhale 2 puffs into the lungs every 6 (six) hours as needed for wheezing. (Patient not taking: Reported on 10/03/2015) 3 Inhaler 1  . aspirin 81 MG tablet Take 81 mg by mouth daily.    Marland Kitchen lisinopril (PRINIVIL,ZESTRIL) 20 MG tablet TAKE 1 TABLET BY MOUTH DAILY 90 tablet 1  . pravastatin (PRAVACHOL) 40 MG tablet TAKE 1 TABLET BY MOUTH DAILY 90 tablet 1  . predniSONE (DELTASONE) 20 MG tablet Take 2 tablets (40 mg total) by mouth daily with breakfast. 14 tablet 0  . Umeclidinium-Vilanterol (ANORO ELLIPTA) 62.5-25 MCG/INH AEPB Inhale 1 Inhaler into the lungs daily.     No current facility-administered medications on file prior to visit.   No Known Allergies Social History   Social History  . Marital Status: Divorced    Spouse Name: N/A  . Number of Children: N/A  . Years of Education: N/A  Occupational History  . Not on file.   Social History Main Topics  . Smoking status: Current Some Day Smoker -- 0.50 packs/day for 25 years    Types: Cigarettes  . Smokeless tobacco: Not on file  . Alcohol Use: No  . Drug Use: No  . Sexual Activity: Not on file   Other Topics Concern  . Not on file   Social History Narrative      Review of Systems  All other systems reviewed and are negative.      Objective:   Physical Exam  Neck: Neck supple.  Cardiovascular: Normal rate, regular rhythm and normal heart sounds.   Pulmonary/Chest: Effort normal. He has decreased  breath sounds. He has no wheezes. He has no rhonchi. He has no rales.  Abdominal: Soft. Bowel sounds are normal.  Vitals reviewed.         Assessment & Plan:  COPD exacerbation ([place]) - Plan: albuterol (PROVENTIL) (2.5 MG/3ML) 0.083% nebulizer solution  Other emphysema ([facility])  COPD exacerbation seems to have resolved. I would like the patient to continue anoro 1 inhalation a day. Decrease the frequency of albuterol nebs 21 neb every 6 hours only as needed. I strongly recommended the patient quit smoking. His blood pressure today is elevated. The last 2 times the patient has been here his blood pressure has not been elevated. Therefore I asked him to come in sometime next week and let us recheck it. If greater than 140/90, I would add hydrochlorothiazide.

## 2016-01-04 ENCOUNTER — Other Ambulatory Visit: Payer: Self-pay | Admitting: Family Medicine

## 2016-01-04 NOTE — Telephone Encounter (Signed)
Refill appropriate and filled per protocol. 

## 2016-01-17 ENCOUNTER — Other Ambulatory Visit: Payer: Commercial Managed Care - HMO

## 2016-01-17 DIAGNOSIS — Z125 Encounter for screening for malignant neoplasm of prostate: Secondary | ICD-10-CM

## 2016-01-17 DIAGNOSIS — I10 Essential (primary) hypertension: Secondary | ICD-10-CM

## 2016-01-17 DIAGNOSIS — J449 Chronic obstructive pulmonary disease, unspecified: Secondary | ICD-10-CM

## 2016-01-17 DIAGNOSIS — Z79899 Other long term (current) drug therapy: Secondary | ICD-10-CM

## 2016-01-17 DIAGNOSIS — Z0000 Encounter for general adult medical examination without abnormal findings: Secondary | ICD-10-CM

## 2016-01-17 DIAGNOSIS — F17200 Nicotine dependence, unspecified, uncomplicated: Secondary | ICD-10-CM

## 2016-01-17 LAB — CBC WITH DIFFERENTIAL/PLATELET
BASOS PCT: 0 % (ref 0–1)
Basophils Absolute: 0 10*3/uL (ref 0.0–0.1)
Eosinophils Absolute: 0.4 10*3/uL (ref 0.0–0.7)
Eosinophils Relative: 6 % — ABNORMAL HIGH (ref 0–5)
HCT: 39.1 % (ref 39.0–52.0)
HEMOGLOBIN: 12.9 g/dL — AB (ref 13.0–17.0)
LYMPHS ABS: 1.1 10*3/uL (ref 0.7–4.0)
LYMPHS PCT: 17 % (ref 12–46)
MCH: 29 pg (ref 26.0–34.0)
MCHC: 33 g/dL (ref 30.0–36.0)
MCV: 87.9 fL (ref 78.0–100.0)
MONOS PCT: 11 % (ref 3–12)
MPV: 9.8 fL (ref 8.6–12.4)
Monocytes Absolute: 0.7 10*3/uL (ref 0.1–1.0)
NEUTROS ABS: 4.1 10*3/uL (ref 1.7–7.7)
NEUTROS PCT: 66 % (ref 43–77)
Platelets: 266 10*3/uL (ref 150–400)
RBC: 4.45 MIL/uL (ref 4.22–5.81)
RDW: 15.1 % (ref 11.5–15.5)
WBC: 6.2 10*3/uL (ref 4.0–10.5)

## 2016-01-17 LAB — COMPLETE METABOLIC PANEL WITH GFR
ALBUMIN: 3.8 g/dL (ref 3.6–5.1)
ALK PHOS: 83 U/L (ref 40–115)
ALT: 14 U/L (ref 9–46)
AST: 17 U/L (ref 10–35)
BILIRUBIN TOTAL: 0.3 mg/dL (ref 0.2–1.2)
BUN: 26 mg/dL — ABNORMAL HIGH (ref 7–25)
CALCIUM: 9.4 mg/dL (ref 8.6–10.3)
CO2: 26 mmol/L (ref 20–31)
Chloride: 105 mmol/L (ref 98–110)
Creat: 1.08 mg/dL (ref 0.70–1.18)
GFR, EST AFRICAN AMERICAN: 79 mL/min (ref >=60)
GFR, EST NON AFRICAN AMERICAN: 69 mL/min (ref >=60)
Glucose, Bld: 83 mg/dL (ref 70–99)
Potassium: 4.4 mmol/L (ref 3.5–5.3)
Sodium: 141 mmol/L (ref 135–146)
TOTAL PROTEIN: 6.6 g/dL (ref 6.1–8.1)

## 2016-01-17 LAB — LIPID PANEL
CHOLESTEROL: 153 mg/dL (ref 125–200)
HDL: 51 mg/dL (ref >=40)
LDL CALC: 88 mg/dL (ref <130)
TRIGLYCERIDES: 72 mg/dL (ref <150)
Total CHOL/HDL Ratio: 3 Ratio (ref <=5.0)
VLDL: 14 mg/dL (ref <30)

## 2016-01-17 LAB — TSH: TSH: 4.63 m[IU]/L — AB (ref 0.40–4.50)

## 2016-01-18 LAB — PSA, MEDICARE: PSA: 2.82 ng/mL (ref <=4.00)

## 2016-01-19 ENCOUNTER — Encounter: Payer: Self-pay | Admitting: Family Medicine

## 2016-01-19 ENCOUNTER — Ambulatory Visit (INDEPENDENT_AMBULATORY_CARE_PROVIDER_SITE_OTHER): Payer: Commercial Managed Care - HMO | Admitting: Family Medicine

## 2016-01-19 VITALS — BP 152/78 | HR 100 | Temp 98.0°F | Resp 20 | Ht 68.0 in | Wt 144.0 lb

## 2016-01-19 DIAGNOSIS — Z0000 Encounter for general adult medical examination without abnormal findings: Secondary | ICD-10-CM | POA: Diagnosis not present

## 2016-01-19 DIAGNOSIS — J441 Chronic obstructive pulmonary disease with (acute) exacerbation: Secondary | ICD-10-CM

## 2016-01-19 DIAGNOSIS — J438 Other emphysema: Secondary | ICD-10-CM | POA: Diagnosis not present

## 2016-01-19 DIAGNOSIS — D649 Anemia, unspecified: Secondary | ICD-10-CM

## 2016-01-19 MED ORDER — ALBUTEROL SULFATE (2.5 MG/3ML) 0.083% IN NEBU: 2.5000 mg | INHALATION_SOLUTION | Freq: Four times a day (QID) | RESPIRATORY_TRACT | Status: DC | PRN

## 2016-01-19 NOTE — Progress Notes (Signed)
Subjective:    Patient ID: William Villegas, male    DOB: [birth year], 70 y.o.   MRN: [id number]  HPI Patient is here today for complete physical exam. He is overdue for numerous preventative measures.  He refuses colonoscopies. I recommended fecal occult blood cards 3 but he states that we checked him in 2015 and he does not want to check them again. His hemoglobin is slightly low at 12.9 but the patient states that is normal for him. He refuses a digital rectal exam today. We had already drawn a PSA  Prior to his appointment which was within normal limits. He has had Pneumovax 23. He is due for a flu shot, the shingles shot, and Prevnar 13. He declines all the shots at the present time. His most recent lab work as listed below. He also continues to smoke and has no desire to quit smoking. He would like a refill on his albuterol nebules  that he uses occasionally when he gets an emphysema exacerbation. Lab on 01/17/2016  Component Date Value Ref Range Status  . PSA 01/17/2016 2.82  <=4.00 ng/mL Final   Comment: Test Methodology: ECLIA PSA (Electrochemiluminescence Immunoassay)   For PSA values from 2.5-4.0, particularly in younger men <76 years old, the AUA and NCCN suggest testing for % Free PSA (3515) and evaluation of the rate of increase in PSA (PSA velocity).   . Sodium 01/17/2016 141  135 - 146 mmol/L Final  . Potassium 01/17/2016 4.4  3.5 - 5.3 mmol/L Final  . Chloride 01/17/2016 105  98 - 110 mmol/L Final  . CO2 01/17/2016 26  20 - 31 mmol/L Final  . Glucose, Bld 01/17/2016 83  70 - 99 mg/dL Final  . BUN 01/17/2016 26* 7 - 25 mg/dL Final  . Creat 01/17/2016 1.08  0.70 - 1.18 mg/dL Final  . Total Bilirubin 01/17/2016 0.3  0.2 - 1.2 mg/dL Final  . Alkaline Phosphatase 01/17/2016 83  40 - 115 U/L Final  . AST 01/17/2016 17  10 - 35 U/L Final  . ALT 01/17/2016 14  9 - 46 U/L Final  . Total Protein 01/17/2016 6.6  6.1 - 8.1 g/dL Final  . Albumin 01/17/2016 3.8  3.6 - 5.1 g/dL Final    . Calcium 01/17/2016 9.4  8.6 - 10.3 mg/dL Final  . GFR, Est African American 01/17/2016 79  >=60 mL/min Final  . GFR, Est Non African American 01/17/2016 69  >=60 mL/min Final   Comment:   The estimated GFR is a calculation valid for adults (>=17 years old) that uses the CKD-EPI algorithm to adjust for age and sex. It is   not to be used for children, pregnant women, hospitalized patients,    patients on dialysis, or with rapidly changing kidney function. According to the NKDEP, eGFR >89 is normal, 60-89 shows mild impairment, 30-59 shows moderate impairment, 15-29 shows severe impairment and <15 is ESRD.     . TSH 01/17/2016 4.63* 0.40 - 4.50 mIU/L Final   Comment: ** Please note change in unit of measure and reference range(s). **     . Cholesterol 01/17/2016 153  125 - 200 mg/dL Final  . Triglycerides 01/17/2016 72  <150 mg/dL Final  . HDL 01/17/2016 51  >=40 mg/dL Final  . Total CHOL/HDL Ratio 01/17/2016 3.0  <=5.0 Ratio Final  . VLDL 01/17/2016 14  <30 mg/dL Final  . LDL Cholesterol 01/17/2016 88  <130 mg/dL Final   Comment:   Total Cholesterol/HDL Ratio:CHD Risk  Coronary Heart Disease Risk Table                                        Men       Women          1/2 Average Risk              3.4        3.3              Average Risk              5.0        4.4           2X Average Risk              9.6        7.1           3X Average Risk             23.4       11.0 Use the calculated Patient Ratio above and the CHD Risk table  to determine the patient's CHD Risk.   . WBC 01/17/2016 6.2  4.0 - 10.5 K/uL Final  . RBC 01/17/2016 4.45  4.22 - 5.81 MIL/uL Final  . Hemoglobin 01/17/2016 12.9* 13.0 - 17.0 g/dL Final  . HCT 01/17/2016 39.1  39.0 - 52.0 % Final  . MCV 01/17/2016 87.9  78.0 - 100.0 fL Final  . MCH 01/17/2016 29.0  26.0 - 34.0 pg Final  . MCHC 01/17/2016 33.0  30.0 - 36.0 g/dL Final  . RDW 01/17/2016 15.1  11.5 - 15.5 % Final  . Platelets  01/17/2016 266  150 - 400 K/uL Final  . MPV 01/17/2016 9.8  8.6 - 12.4 fL Final  . Neutrophils Relative % 01/17/2016 66  43 - 77 % Final  . Neutro Abs 01/17/2016 4.1  1.7 - 7.7 K/uL Final  . Lymphocytes Relative 01/17/2016 17  12 - 46 % Final  . Lymphs Abs 01/17/2016 1.1  0.7 - 4.0 K/uL Final  . Monocytes Relative 01/17/2016 11  3 - 12 % Final  . Monocytes Absolute 01/17/2016 0.7  0.1 - 1.0 K/uL Final  . Eosinophils Relative 01/17/2016 6* 0 - 5 % Final  . Eosinophils Absolute 01/17/2016 0.4  0.0 - 0.7 K/uL Final  . Basophils Relative 01/17/2016 0  0 - 1 % Final  . Basophils Absolute 01/17/2016 0.0  0.0 - 0.1 K/uL Final  . Smear Review 01/17/2016 Criteria for review not met   Final   Past Medical History  Diagnosis Date  . COPD (chronic obstructive pulmonary disease) ([place])   . Hypertension    No past surgical history on file. Current Outpatient Prescriptions on File Prior to Visit  Medication Sig Dispense Refill  . albuterol (PROVENTIL HFA;VENTOLIN HFA) 108 (90 BASE) MCG/ACT inhaler Inhale 2 puffs into the lungs every 6 (six) hours as needed for wheezing. 3 Inhaler 1  . aspirin 81 MG tablet Take 81 mg by mouth daily.    Marland Kitchen lisinopril (PRINIVIL,ZESTRIL) 20 MG tablet TAKE 1 TABLET BY MOUTH EVERY DAY 90 tablet 0  . pravastatin (PRAVACHOL) 40 MG tablet TAKE 1 TABLET BY MOUTH DAILY 90 tablet 1  . Umeclidinium-Vilanterol (ANORO ELLIPTA) 62.5-25 MCG/INH AEPB Inhale 1 Inhaler into the lungs daily. 1 each 11  . predniSONE (DELTASONE) 20 MG tablet Take 2 tablets (40 mg  total) by mouth daily with breakfast. 14 tablet 0   No current facility-administered medications on file prior to visit.   No Known Allergies Social History   Social History  . Marital Status: Divorced    Spouse Name: N/A  . Number of Children: N/A  . Years of Education: N/A   Occupational History  . Not on file.   Social History Main Topics  . Smoking status: Current Some Day Smoker -- 0.50 packs/day for 25 years     Types: Cigarettes  . Smokeless tobacco: Not on file  . Alcohol Use: No  . Drug Use: No  . Sexual Activity: Not on file   Other Topics Concern  . Not on file   Social History Narrative   No family history on file.    Review of Systems  All other systems reviewed and are negative.      Objective:   Physical Exam  Constitutional: He is oriented to person, place, and time. He appears well-developed and well-nourished. No distress.  HENT:  Head: Normocephalic and atraumatic.  Right Ear: External ear normal.  Left Ear: External ear normal.  Nose: Nose normal.  Mouth/Throat: Oropharynx is clear and moist. No oropharyngeal exudate.  Eyes: Conjunctivae and EOM are normal. Pupils are equal, round, and reactive to light. Right eye exhibits no discharge. Left eye exhibits no discharge. No scleral icterus.  Neck: Normal range of motion. Neck supple. No JVD present. No tracheal deviation present. No thyromegaly present.  Cardiovascular: Normal rate, regular rhythm, normal heart sounds and intact distal pulses.  Exam reveals no gallop and no friction rub.   No murmur heard. Pulmonary/Chest: Effort normal. No stridor. No respiratory distress. He has wheezes. He has no rales. He exhibits no tenderness.  Abdominal: Soft. Bowel sounds are normal. He exhibits no distension and no mass. There is no tenderness. There is no rebound and no guarding.  Musculoskeletal: Normal range of motion. He exhibits no edema or tenderness.  Lymphadenopathy:    He has no cervical adenopathy.  Neurological: He is alert and oriented to person, place, and time. He has normal reflexes. He displays normal reflexes. No cranial nerve deficit. He exhibits normal muscle tone. Coordination normal.  Skin: Skin is warm. No rash noted. He is not diaphoretic. No erythema. No pallor.  Psychiatric: He has a normal mood and affect. His behavior is normal. Judgment and thought content normal.  Vitals reviewed.           Assessment & Plan:  COPD exacerbation ([place]) - Plan: albuterol (PROVENTIL) (2.5 MG/3ML) 0.083% nebulizer solution  Routine general medical examination at a health care facility  Other emphysema ([place])  Low hemoglobin  Patient refuses hepatitis C screening today. He declines a colonoscopy. He declines a digital rectal exam. His PSA is normal. The remainder of his lab work is excellent. He declines Prevnar 13 and a flu shot. We did discuss the shingles vaccine. His blood pressure is elevated today at 152/78. He states that he would like to recheck it next week. If still elevated at next week's appointment, I would add hydrochlorothiazide 25 mg by mouth daily to his blood pressure medication. His cholesterol is excellent and I will make no changes in his pravastatin at this time.

## 2016-04-01 ENCOUNTER — Other Ambulatory Visit: Payer: Self-pay | Admitting: Family Medicine

## 2016-04-01 NOTE — Telephone Encounter (Signed)
Refill appropriate and filled per protocol. 

## 2016-06-07 ENCOUNTER — Other Ambulatory Visit: Payer: Self-pay | Admitting: Family Medicine

## 2016-06-07 MED ORDER — LISINOPRIL 20 MG PO TABS: 20.0000 mg | ORAL_TABLET | Freq: Every day | ORAL | Status: DC

## 2016-06-07 MED ORDER — PRAVASTATIN SODIUM 40 MG PO TABS: 40.0000 mg | ORAL_TABLET | Freq: Every day | ORAL | Status: DC

## 2016-06-07 MED ORDER — UMECLIDINIUM-VILANTEROL 62.5-25 MCG/INH IN AEPB: 1.0000 | INHALATION_SPRAY | Freq: Every day | RESPIRATORY_TRACT | Status: DC

## 2016-06-07 NOTE — Telephone Encounter (Signed)
Medication called/sent to requested pharmacy  

## 2016-06-12 ENCOUNTER — Other Ambulatory Visit: Payer: Self-pay | Admitting: Family Medicine

## 2016-09-26 ENCOUNTER — Other Ambulatory Visit: Payer: Self-pay | Admitting: Family Medicine

## 2016-11-05 ENCOUNTER — Other Ambulatory Visit: Payer: Self-pay | Admitting: Family Medicine

## 2016-11-19 ENCOUNTER — Encounter: Payer: Self-pay | Admitting: Family Medicine

## 2016-11-19 ENCOUNTER — Ambulatory Visit (INDEPENDENT_AMBULATORY_CARE_PROVIDER_SITE_OTHER): Payer: Commercial Managed Care - HMO | Admitting: Family Medicine

## 2016-11-19 VITALS — BP 142/88 | HR 110 | Temp 97.7°F | Resp 20 | Ht 69.0 in | Wt 155.0 lb

## 2016-11-19 DIAGNOSIS — J438 Other emphysema: Secondary | ICD-10-CM | POA: Diagnosis not present

## 2016-11-19 MED ORDER — UMECLIDINIUM-VILANTEROL 62.5-25 MCG/INH IN AEPB: 1.0000 | INHALATION_SPRAY | Freq: Every day | RESPIRATORY_TRACT | 4 refills | Status: DC

## 2016-11-19 NOTE — Progress Notes (Signed)
Subjective:    Patient ID: William Villegas, male    DOB: 04-07-1946, 70 y.o.   MRN: [id number]  HPI  01/2016 Patient is here today for complete physical exam. He is overdue for numerous preventative measures.  He refuses colonoscopies. I recommended fecal occult blood cards 3 but he states that we checked him in 2015 and he does not want to check them again. His hemoglobin is slightly low at 12.9 but the patient states that is normal for him. He refuses a digital rectal exam today. We had already drawn a PSA  Prior to his appointment which was within normal limits. He has had Pneumovax 23. He is due for a flu shot, the shingles shot, and Prevnar 13. He declines all the shots at the present time. His most recent lab work as listed below. He also continues to smoke and has no desire to quit smoking. He would like a refill on his albuterol nebules  that he uses occasionally when he gets an emphysema exacerbation.  At that time, my plan was: Patient refuses hepatitis C screening today. He declines a colonoscopy. He declines a digital rectal exam. His PSA is normal. The remainder of his lab work is excellent. He declines Prevnar 13 and a flu shot. We did discuss the shingles vaccine. His blood pressure is elevated today at 152/78. He states that he would like to recheck it next week. If still elevated at next week's appointment, I would add hydrochlorothiazide 25 mg by mouth daily to his blood pressure medication. His cholesterol is excellent and I will make no changes in his pravastatin at this time.  11/19/16 Patient states that he is having a difficult time breathing. Due to cost, he discontinued anoro.  He has not had any medicine in several weeks. He is wheezing on exam today. He denies any chest pain. He denies any sputum production. He does report a dry chronic cough. Fortunately he quit smoking approximately 2 months ago due to cost. He has not smoked any since. He denies any hemoptysis. Wt Readings  from Last 3 Encounters:  11/19/16 155 lb (70.3 kg)  01/19/16 144 lb (65.3 kg)  10/17/15 142 lb (64.4 kg)   He does not demonstrate any weight loss. He refuses a flu shot today. He refuses Prevnar 13. He refuses a colonoscopy. He refuses hepatitis C screening. He would like to defer lab work until his complete physical in February    Past Medical History:  Diagnosis Date  . COPD (chronic obstructive pulmonary disease) ([place])   . Hypertension    No past surgical history on file. Current Outpatient Prescriptions on File Prior to Visit  Medication Sig Dispense Refill  . albuterol (PROVENTIL HFA;VENTOLIN HFA) 108 (90 BASE) MCG/ACT inhaler Inhale 2 puffs into the lungs every 6 (six) hours as needed for wheezing. 3 Inhaler 1  . albuterol (PROVENTIL) (2.5 MG/3ML) 0.083% nebulizer solution Take 3 mLs (2.5 mg total) by nebulization every 6 (six) hours as needed for wheezing or shortness of breath. 150 mL 2  . aspirin 81 MG tablet Take 81 mg by mouth daily.    Marland Kitchen lisinopril (PRINIVIL,ZESTRIL) 20 MG tablet Take 1 tablet (20 mg total) by mouth daily. 90 tablet 2  . pravastatin (PRAVACHOL) 40 MG tablet TAKE 1 TABLET EVERY DAY 90 tablet 1   No current facility-administered medications on file prior to visit.    No Known Allergies Social History   Social History  . Marital status: Divorced  Spouse name: N/A  . Number of children: N/A  . Years of education: N/A   Occupational History  . Not on file.   Social History Main Topics  . Smoking status: Current Some Day Smoker    Packs/day: 0.50    Years: 25.00    Types: Cigarettes  . Smokeless tobacco: Not on file  . Alcohol use No  . Drug use: No  . Sexual activity: Not on file   Other Topics Concern  . Not on file   Social History Narrative  . No narrative on file   No family history on file.    Review of Systems  All other systems reviewed and are negative.      Objective:   Physical Exam  Constitutional: He is oriented to  person, place, and time. He appears well-developed and well-nourished. No distress.  HENT:  Head: Normocephalic and atraumatic.  Right Ear: External ear normal.  Left Ear: External ear normal.  Nose: Nose normal.  Mouth/Throat: Oropharynx is clear and moist. No oropharyngeal exudate.  Eyes: Conjunctivae and EOM are normal. Pupils are equal, round, and reactive to light. Right eye exhibits no discharge. Left eye exhibits no discharge. No scleral icterus.  Neck: Normal range of motion. Neck supple. No JVD present. No tracheal deviation present. No thyromegaly present.  Cardiovascular: Normal rate, regular rhythm, normal heart sounds and intact distal pulses.  Exam reveals no gallop and no friction rub.   No murmur heard. Pulmonary/Chest: Effort normal. No stridor. No respiratory distress. He has wheezes. He has no rales. He exhibits no tenderness.  Abdominal: Soft. Bowel sounds are normal. He exhibits no distension and no mass. There is no tenderness. There is no rebound and no guarding.  Musculoskeletal: Normal range of motion. He exhibits no edema or tenderness.  Lymphadenopathy:    He has no cervical adenopathy.  Neurological: He is alert and oriented to person, place, and time. He has normal reflexes. No cranial nerve deficit. He exhibits normal muscle tone. Coordination normal.  Skin: Skin is warm. No rash noted. He is not diaphoretic. No erythema. No pallor.  Psychiatric: He has a normal mood and affect. His behavior is normal. Judgment and thought content normal.  Vitals reviewed.         Assessment & Plan:  Other emphysema ([place])  I gave the patient 2 samples of anoro one inhalation daily. We also discussed ways to apply for medication assistance. He requires his medication help manage his emphysema. Fortunately he is quit smoking. I encouraged the patient to continue with this. I strongly recommended Prevnar 13 as well as the flu shot but the patient refused. Check fasting lab work  at his physical in February per his request

## 2016-11-29 ENCOUNTER — Telehealth: Payer: Self-pay | Admitting: Family Medicine

## 2016-11-29 MED ORDER — PREDNISONE 20 MG PO TABS: ORAL_TABLET | ORAL | 0 refills | Status: DC

## 2016-11-29 MED ORDER — UMECLIDINIUM-VILANTEROL 62.5-25 MCG/INH IN AEPB: 1.0000 | INHALATION_SPRAY | Freq: Every day | RESPIRATORY_TRACT | 0 refills | Status: AC

## 2016-11-29 NOTE — Telephone Encounter (Signed)
Pt called [facility] stating that the medication you put him on at [facility] is not helping and that you wanted to know if it helped or not and it is not.

## 2016-11-29 NOTE — Telephone Encounter (Signed)
Add a prednisone taper pack and recheck here next week.

## 2016-11-29 NOTE — Telephone Encounter (Signed)
Pt called and told about prednisone.  Needs Anor samples and we don't have any.  Wrote prescription for 1 month and pt to come by and pick up to get one month free at pharmacy with coupon.

## 2016-12-05 ENCOUNTER — Inpatient Hospital Stay (HOSPITAL_COMMUNITY)
Admission: EM | Admit: 2016-12-05 | Discharge: 2016-12-11 | DRG: 208 | Disposition: A | Discharge status: Home / Self Care | Payer: Commercial Managed Care - HMO | Attending: Internal Medicine | Admitting: Internal Medicine

## 2016-12-05 ENCOUNTER — Encounter (HOSPITAL_COMMUNITY): Payer: Self-pay | Admitting: Emergency Medicine

## 2016-12-05 ENCOUNTER — Emergency Department (HOSPITAL_COMMUNITY): Payer: Commercial Managed Care - HMO

## 2016-12-05 DIAGNOSIS — Z7982 Long term (current) use of aspirin: Secondary | ICD-10-CM | POA: Diagnosis not present

## 2016-12-05 DIAGNOSIS — R64 Cachexia: Secondary | ICD-10-CM | POA: Diagnosis present

## 2016-12-05 DIAGNOSIS — J181 Lobar pneumonia, unspecified organism: Secondary | ICD-10-CM | POA: Diagnosis not present

## 2016-12-05 DIAGNOSIS — F17210 Nicotine dependence, cigarettes, uncomplicated: Secondary | ICD-10-CM | POA: Diagnosis present

## 2016-12-05 DIAGNOSIS — J09X2 Influenza due to identified novel influenza A virus with other respiratory manifestations: Secondary | ICD-10-CM

## 2016-12-05 DIAGNOSIS — J441 Chronic obstructive pulmonary disease with (acute) exacerbation: Secondary | ICD-10-CM | POA: Diagnosis present

## 2016-12-05 DIAGNOSIS — I10 Essential (primary) hypertension: Secondary | ICD-10-CM | POA: Diagnosis present

## 2016-12-05 DIAGNOSIS — J09X1 Influenza due to identified novel influenza A virus with pneumonia: Secondary | ICD-10-CM | POA: Diagnosis present

## 2016-12-05 DIAGNOSIS — I77810 Thoracic aortic ectasia: Secondary | ICD-10-CM | POA: Diagnosis present

## 2016-12-05 DIAGNOSIS — J9601 Acute respiratory failure with hypoxia: Secondary | ICD-10-CM | POA: Diagnosis present

## 2016-12-05 DIAGNOSIS — Z79899 Other long term (current) drug therapy: Secondary | ICD-10-CM

## 2016-12-05 DIAGNOSIS — R0902 Hypoxemia: Secondary | ICD-10-CM

## 2016-12-05 DIAGNOSIS — J189 Pneumonia, unspecified organism: Secondary | ICD-10-CM

## 2016-12-05 DIAGNOSIS — R778 Other specified abnormalities of plasma proteins: Secondary | ICD-10-CM | POA: Diagnosis present

## 2016-12-05 DIAGNOSIS — R197 Diarrhea, unspecified: Secondary | ICD-10-CM | POA: Diagnosis not present

## 2016-12-05 DIAGNOSIS — J810 Acute pulmonary edema: Secondary | ICD-10-CM | POA: Diagnosis not present

## 2016-12-05 DIAGNOSIS — G9340 Encephalopathy, unspecified: Secondary | ICD-10-CM | POA: Diagnosis present

## 2016-12-05 DIAGNOSIS — R0600 Dyspnea, unspecified: Secondary | ICD-10-CM | POA: Diagnosis present

## 2016-12-05 DIAGNOSIS — R000 Tachycardia, unspecified: Secondary | ICD-10-CM | POA: Diagnosis present

## 2016-12-05 DIAGNOSIS — R0603 Acute respiratory distress: Secondary | ICD-10-CM

## 2016-12-05 DIAGNOSIS — J440 Chronic obstructive pulmonary disease with acute lower respiratory infection: Secondary | ICD-10-CM | POA: Diagnosis present

## 2016-12-05 DIAGNOSIS — Z6824 Body mass index (BMI) 24.0-24.9, adult: Secondary | ICD-10-CM | POA: Diagnosis not present

## 2016-12-05 LAB — URINALYSIS, ROUTINE W REFLEX MICROSCOPIC
Bilirubin Urine: NEGATIVE
GLUCOSE, UA: NEGATIVE mg/dL
Ketones, ur: 20 mg/dL — AB
LEUKOCYTES UA: NEGATIVE
NITRITE: NEGATIVE
PH: 5 (ref 5.0–8.0)
PROTEIN: 100 mg/dL — AB
SPECIFIC GRAVITY, URINE: 1.028 (ref 1.005–1.030)

## 2016-12-05 LAB — CBC WITH DIFFERENTIAL/PLATELET
BASOS ABS: 0 10*3/uL (ref 0.0–0.1)
BASOS PCT: 0 %
EOS ABS: 0 10*3/uL (ref 0.0–0.7)
Eosinophils Relative: 0 %
HCT: 41.6 % (ref 39.0–52.0)
HEMOGLOBIN: 13.1 g/dL (ref 13.0–17.0)
LYMPHS PCT: 13 %
Lymphs Abs: 3.7 10*3/uL (ref 0.7–4.0)
MCH: 29 pg (ref 26.0–34.0)
MCHC: 31.5 g/dL (ref 30.0–36.0)
MCV: 92.2 fL (ref 78.0–100.0)
MONO ABS: 1.7 10*3/uL — AB (ref 0.1–1.0)
Monocytes Relative: 6 %
NEUTROS PCT: 81 %
Neutro Abs: 23.4 10*3/uL — ABNORMAL HIGH (ref 1.7–7.7)
PLATELETS: 383 10*3/uL (ref 150–400)
RBC: 4.51 MIL/uL (ref 4.22–5.81)
RDW: 14.1 % (ref 11.5–15.5)
WBC: 28.8 10*3/uL — ABNORMAL HIGH (ref 4.0–10.5)

## 2016-12-05 LAB — I-STAT CG4 LACTIC ACID, ED
LACTIC ACID, VENOUS: 2.87 mmol/L — AB (ref 0.5–1.9)
LACTIC ACID, VENOUS: 2.99 mmol/L — AB (ref 0.5–1.9)

## 2016-12-05 LAB — I-STAT ARTERIAL BLOOD GAS, ED
ACID-BASE DEFICIT: 2 mmol/L (ref 0.0–2.0)
Bicarbonate: 24.7 mmol/L (ref 20.0–28.0)
O2 Saturation: 100 %
PH ART: 7.315 — AB (ref 7.350–7.450)
TCO2: 26 mmol/L (ref 0–100)
pCO2 arterial: 48.4 mmHg — ABNORMAL HIGH (ref 32.0–48.0)
pO2, Arterial: 470 mmHg — ABNORMAL HIGH (ref 83.0–108.0)

## 2016-12-05 LAB — COMPREHENSIVE METABOLIC PANEL
ALBUMIN: 3.5 g/dL (ref 3.5–5.0)
ALK PHOS: 85 U/L (ref 38–126)
ALT: 20 U/L (ref 17–63)
ANION GAP: 13 (ref 5–15)
AST: 40 U/L (ref 15–41)
BUN: 25 mg/dL — ABNORMAL HIGH (ref 6–20)
CHLORIDE: 100 mmol/L — AB (ref 101–111)
CO2: 25 mmol/L (ref 22–32)
Calcium: 9.3 mg/dL (ref 8.9–10.3)
Creatinine, Ser: 1.22 mg/dL (ref 0.61–1.24)
GFR calc non Af Amer: 57 mL/min — ABNORMAL LOW (ref >60)
GLUCOSE: 127 mg/dL — AB (ref 65–99)
Potassium: 4.3 mmol/L (ref 3.5–5.1)
SODIUM: 138 mmol/L (ref 135–145)
Total Bilirubin: 1 mg/dL (ref 0.3–1.2)
Total Protein: 6.9 g/dL (ref 6.5–8.1)

## 2016-12-05 LAB — BRAIN NATRIURETIC PEPTIDE: B NATRIURETIC PEPTIDE 5: 326.2 pg/mL — AB (ref 0.0–100.0)

## 2016-12-05 LAB — TRIGLYCERIDES: Triglycerides: 65 mg/dL (ref <150)

## 2016-12-05 LAB — TROPONIN I: Troponin I: 0.08 ng/mL (ref <0.03)

## 2016-12-05 LAB — LACTIC ACID, PLASMA: LACTIC ACID, VENOUS: 2.9 mmol/L — AB (ref 0.5–1.9)

## 2016-12-05 MED ORDER — PIPERACILLIN-TAZOBACTAM 3.375 G IVPB 30 MIN
3.3750 g | Freq: Once | INTRAVENOUS | Status: AC
  Administered 2016-12-05: 3.375 g via INTRAVENOUS
  Filled 2016-12-05: qty 50

## 2016-12-05 MED ORDER — ASPIRIN 81 MG PO TABS: 81.0000 mg | ORAL_TABLET | Freq: Every day | ORAL | Status: DC

## 2016-12-05 MED ORDER — FENTANYL CITRATE (PF) 100 MCG/2ML IJ SOLN
50.0000 ug | INTRAMUSCULAR | Status: DC | PRN
  Administered 2016-12-05: 50 ug via INTRAVENOUS

## 2016-12-05 MED ORDER — SODIUM CHLORIDE 0.9 % IV BOLUS (SEPSIS)
1000.0000 mL | Freq: Once | INTRAVENOUS | Status: AC
  Administered 2016-12-05: 1000 mL via INTRAVENOUS

## 2016-12-05 MED ORDER — ROCURONIUM BROMIDE 50 MG/5ML IV SOLN
INTRAVENOUS | Status: AC | PRN
  Administered 2016-12-05: 80 mg via INTRAVENOUS

## 2016-12-05 MED ORDER — DEXTROSE 5 % IV SOLN
1.0000 g | INTRAVENOUS | Status: DC
  Administered 2016-12-06 – 2016-12-10 (×6): 1 g via INTRAVENOUS
  Filled 2016-12-05 (×7): qty 10

## 2016-12-05 MED ORDER — IPRATROPIUM-ALBUTEROL 0.5-2.5 (3) MG/3ML IN SOLN
3.0000 mL | Freq: Four times a day (QID) | RESPIRATORY_TRACT | Status: DC
  Administered 2016-12-05 – 2016-12-08 (×13): 3 mL via RESPIRATORY_TRACT
  Filled 2016-12-05 (×13): qty 3

## 2016-12-05 MED ORDER — ALBUTEROL SULFATE (2.5 MG/3ML) 0.083% IN NEBU: 2.5000 mg | INHALATION_SOLUTION | RESPIRATORY_TRACT | Status: DC | PRN

## 2016-12-05 MED ORDER — SODIUM CHLORIDE 0.9 % IV BOLUS (SEPSIS)
250.0000 mL | Freq: Once | INTRAVENOUS | Status: AC
  Administered 2016-12-05: 250 mL via INTRAVENOUS

## 2016-12-05 MED ORDER — HEPARIN SODIUM (PORCINE) 5000 UNIT/ML IJ SOLN
5000.0000 [IU] | Freq: Three times a day (TID) | INTRAMUSCULAR | Status: DC
  Administered 2016-12-06 – 2016-12-11 (×16): 5000 [IU] via SUBCUTANEOUS
  Filled 2016-12-05 (×15): qty 1

## 2016-12-05 MED ORDER — DEXTROSE 5 % IV SOLN
500.0000 mg | INTRAVENOUS | Status: DC
  Administered 2016-12-06 – 2016-12-07 (×3): 500 mg via INTRAVENOUS
  Filled 2016-12-05 (×3): qty 500

## 2016-12-05 MED ORDER — PRAVASTATIN SODIUM 40 MG PO TABS
40.0000 mg | ORAL_TABLET | Freq: Every day | ORAL | Status: DC
  Administered 2016-12-06 – 2016-12-07 (×2): 40 mg
  Filled 2016-12-05 (×2): qty 1

## 2016-12-05 MED ORDER — PANTOPRAZOLE SODIUM 40 MG IV SOLR
40.0000 mg | Freq: Every day | INTRAVENOUS | Status: DC
Start: 2016-12-05 — End: 2016-12-07
  Administered 2016-12-06: 40 mg via INTRAVENOUS
  Filled 2016-12-05: qty 40

## 2016-12-05 MED ORDER — PRAVASTATIN SODIUM 40 MG PO TABS: 40.0000 mg | ORAL_TABLET | Freq: Every day | ORAL | Status: DC

## 2016-12-05 MED ORDER — SODIUM CHLORIDE 0.9 % IV SOLN: 250.0000 mL | INTRAVENOUS | Status: DC | PRN

## 2016-12-05 MED ORDER — PROPOFOL 1000 MG/100ML IV EMUL
0.0000 ug/kg/min | INTRAVENOUS | Status: DC
  Administered 2016-12-06: 9 ug/kg/min via INTRAVENOUS
  Filled 2016-12-05: qty 100

## 2016-12-05 MED ORDER — VANCOMYCIN HCL 10 G IV SOLR
1500.0000 mg | Freq: Once | INTRAVENOUS | Status: AC
  Administered 2016-12-05: 1500 mg via INTRAVENOUS
  Filled 2016-12-05: qty 1500

## 2016-12-05 MED ORDER — FENTANYL CITRATE (PF) 100 MCG/2ML IJ SOLN: 50.0000 ug | INTRAMUSCULAR | Status: DC | PRN

## 2016-12-05 MED ORDER — METHYLPREDNISOLONE SODIUM SUCC 40 MG IJ SOLR
40.0000 mg | Freq: Two times a day (BID) | INTRAMUSCULAR | Status: DC
  Administered 2016-12-06 – 2016-12-07 (×3): 40 mg via INTRAVENOUS
  Filled 2016-12-05 (×3): qty 1

## 2016-12-05 MED ORDER — PROPOFOL 1000 MG/100ML IV EMUL
INTRAVENOUS | Status: AC
  Administered 2016-12-05: 5 ug/kg/min via INTRAVENOUS
  Filled 2016-12-05: qty 100

## 2016-12-05 MED ORDER — VANCOMYCIN HCL IN DEXTROSE 750-5 MG/150ML-% IV SOLN: 750.0000 mg | Freq: Two times a day (BID) | INTRAVENOUS | Status: DC

## 2016-12-05 MED ORDER — VANCOMYCIN HCL IN DEXTROSE 1-5 GM/200ML-% IV SOLN: 1000.0000 mg | Freq: Once | INTRAVENOUS | Status: DC

## 2016-12-05 MED ORDER — FENTANYL CITRATE (PF) 100 MCG/2ML IJ SOLN
50.0000 ug | INTRAMUSCULAR | Status: DC | PRN
Start: 2016-12-05 — End: 2016-12-06
  Filled 2016-12-05 (×2): qty 2

## 2016-12-05 MED ORDER — OSELTAMIVIR PHOSPHATE 75 MG PO CAPS
75.0000 mg | ORAL_CAPSULE | Freq: Two times a day (BID) | ORAL | Status: DC
  Filled 2016-12-05: qty 1

## 2016-12-05 MED ORDER — SODIUM CHLORIDE 0.9 % IV SOLN
INTRAVENOUS | Status: DC
  Administered 2016-12-05 – 2016-12-06 (×2): via INTRAVENOUS
  Administered 2016-12-07: 50 mL/h via INTRAVENOUS

## 2016-12-05 MED ORDER — PIPERACILLIN-TAZOBACTAM 3.375 G IVPB: 3.3750 g | Freq: Three times a day (TID) | INTRAVENOUS | Status: DC

## 2016-12-05 MED ORDER — ETOMIDATE 2 MG/ML IV SOLN
INTRAVENOUS | Status: AC | PRN
  Administered 2016-12-05: 30 mg via INTRAVENOUS

## 2016-12-05 MED ORDER — ASPIRIN 81 MG PO CHEW
81.0000 mg | CHEWABLE_TABLET | Freq: Every day | ORAL | Status: DC
  Administered 2016-12-06 – 2016-12-08 (×3): 81 mg
  Filled 2016-12-05 (×3): qty 1

## 2016-12-05 MED ORDER — PROPOFOL 1000 MG/100ML IV EMUL
5.0000 ug/kg/min | Freq: Once | INTRAVENOUS | Status: AC
  Administered 2016-12-05: 5 ug/kg/min via INTRAVENOUS
  Administered 2016-12-05: 10 ug/kg/min via INTRAVENOUS

## 2016-12-05 NOTE — Progress Notes (Signed)
Patient transported from ED to 2S07 without any complications.

## 2016-12-05 NOTE — ED Triage Notes (Signed)
Per [facility]: Pt to ED from home c/o resp distress, hx COPD and HTN. Has had fever and SOB x 3 days, upon EMS arrival, pt A&O x 4, mental status declined en route. Wheezing and diminished breath sounds all lobes. 88% SpO2 RA, 97% with nebulizer treatment, HR 140 ST, 198/110. Given total 15mg  albuterol, 0.5mg  atrovent, 125mg  solumedrol, 2g mag sulfate. Resp labored, tachypneic.

## 2016-12-05 NOTE — Sedation Documentation (Signed)
Positive color change, ETT 7.5, 23 at the gums.

## 2016-12-05 NOTE — ED Notes (Signed)
Suction and ambu-bag set up at bedside.

## 2016-12-05 NOTE — Progress Notes (Signed)
Pharmacy Antibiotic Note  William Villegas is a 70 y.o. male admitted on 12/05/2016 with sepsis.  Pharmacy has been consulted for vancomycin and zosyn dosing. Pt is afebrile but WBC is significantly elevated and lactic acid is elevated. Scr is 1.22.   Plan: Vancomycin 1500mg  IV x 1 then 750mg  IV Q12H Zosyn 3.375gm IV Q8H (4 hr inf) F/u renal fxn, C&S, clinical status and trough at SS  Weight: 154 lb 5.2 oz (70 kg)  Temp (24hrs), Avg:99.4 F (37.4 C), Min:99.4 F (37.4 C), Max:99.4 F (37.4 C)   Recent Labs Lab 12/05/16 1945 12/05/16 2004  WBC 28.8*  --   CREATININE 1.22  --   LATICACIDVEN  --  2.87*    Estimated Creatinine Clearance: 54.2 mL/min (by C-G formula based on SCr of 1.22 mg/dL).    No Known Allergies  Antimicrobials this admission: Vanc 12/28>> Zosyn 12/28>>  Dose adjustments this admission: N/A  Microbiology results: Pending  Thank you for allowing pharmacy to be a part of this patient's care.  Jaquesha Boroff, Rande Lawman 12/05/2016 9:28 PM

## 2016-12-05 NOTE — ED Notes (Addendum)
Family at bedside. RN updated them on patient's condition, MD to talk with them more shortly.

## 2016-12-05 NOTE — ED Provider Notes (Signed)
[facility] DEPT Provider Note   CSN: [id number] Arrival date & time: 12/05/16  1933     History   Chief Complaint Chief Complaint  Patient presents with  . Respiratory Distress    HPI William Villegas is a 70 y.o. male.  HPI  70 year old male with a history of hypertension and COPD presents to the ED brought in by EMS for several days of worsening shortness of breath with associated cough and subjective fevers per EMS. In route patient received several rounds of DuoNeb's and given Solu-Medrol. His blood pressure was noted to be hypertensive and patient was tachycardic. His mental status declined in route thus not been able to place him on a CPAP.  Upon arrival patient is oriented to self and place, but is somnolent.  Remainder of history, ROS, and physical exam limited due to patient's condition (AMS). Additional information was obtained from either EMS or family.   Level V Caveat.    Past Medical History:  Diagnosis Date  . COPD (chronic obstructive pulmonary disease) ([place])   . Hypertension     Patient Active Problem List   Diagnosis Date Noted  . Nicotine dependence 09/01/2013  . Acute respiratory distress 08/30/2013  . SOB (shortness of breath) 08/30/2013  . COPD (chronic obstructive pulmonary disease) with acute bronchitis ([place]) 08/30/2013  . HTN (hypertension) 08/30/2013    No past surgical history on file.     Home Medications    Prior to Admission medications   Medication Sig Start Date End Date Taking? Authorizing Provider  albuterol (PROVENTIL HFA;VENTOLIN HFA) 108 (90 BASE) MCG/ACT inhaler Inhale 2 puffs into the lungs every 6 (six) hours as needed for wheezing. 05/31/15   Susy Frizzle, MD  albuterol (PROVENTIL) (2.5 MG/3ML) 0.083% nebulizer solution Take 3 mLs (2.5 mg total) by nebulization every 6 (six) hours as needed for wheezing or shortness of breath. 01/19/16   Susy Frizzle, MD  aspirin 81 MG tablet Take 81 mg by mouth daily.     Historical Provider, MD  lisinopril (PRINIVIL,ZESTRIL) 20 MG tablet Take 1 tablet (20 mg total) by mouth daily. 06/07/16   Susy Frizzle, MD  pravastatin (PRAVACHOL) 40 MG tablet TAKE 1 TABLET EVERY DAY 11/05/16   Susy Frizzle, MD  predniSONE (DELTASONE) 20 MG tablet Take 3 tablets by mouth for 2 days, then 2 tablets by mouth for 2 days, then 1 tablet by mouth for 2 days 11/29/16   Susy Frizzle, MD  umeclidinium-vilanterol (ANORO ELLIPTA) 62.5-25 MCG/INH AEPB Inhale 1 puff into the lungs daily. 11/29/16   Susy Frizzle, MD    Family History No family history on file.  Social History Social History  Substance Use Topics  . Smoking status: Current Some Day Smoker    Packs/day: 0.50    Years: 25.00    Types: Cigarettes  . Smokeless tobacco: Never Used  . Alcohol use No     Allergies   Patient has no known allergies.   Review of Systems Review of Systems  Unable to perform ROS: Mental status change     Physical Exam Updated Vital Signs BP 153/100   Pulse (!) 122   Temp 99.4 F (37.4 C) (Rectal)   Resp 18   Wt 154 lb 5.2 oz (70 kg)   SpO2 100%   BMI 22.79 kg/m   Physical Exam  Constitutional: He is oriented to person, place, and time. He appears well-developed and well-nourished. No distress.  HENT:  Head: Normocephalic and  atraumatic.  Nose: Nose normal.  Eyes: Conjunctivae and EOM are normal. Pupils are equal, round, and reactive to light. Right eye exhibits no discharge. Left eye exhibits no discharge. No scleral icterus.  Neck: Normal range of motion. Neck supple.  Cardiovascular: Regular rhythm.  Tachycardia present.  Exam reveals no gallop and no friction rub.   No murmur heard. Pulmonary/Chest: No stridor. Tachypnea noted. He is in respiratory distress. He has wheezes. He has rhonchi. He has rales.  Abdominal: Soft. He exhibits no distension. There is no tenderness.  Musculoskeletal: He exhibits no edema or tenderness.  Neurological: He is alert  and oriented to person, place, and time.  Skin: Skin is warm and dry. No rash noted. He is not diaphoretic. No erythema.  Psychiatric: He has a normal mood and affect.  Vitals reviewed.    ED Treatments / Results  Labs (all labs ordered are listed, but only abnormal results are displayed) Labs Reviewed  CBC WITH DIFFERENTIAL/PLATELET - Abnormal; Notable for the following:       Result Value   WBC 28.8 (*)    Neutro Abs 23.4 (*)    Monocytes Absolute 1.7 (*)    All other components within normal limits  URINALYSIS, ROUTINE W REFLEX MICROSCOPIC - Abnormal; Notable for the following:    Hgb urine dipstick SMALL (*)    Ketones, ur 20 (*)    Protein, ur 100 (*)    Bacteria, UA RARE (*)    Squamous Epithelial / LPF 0-5 (*)    All other components within normal limits  I-STAT ARTERIAL BLOOD GAS, ED - Abnormal; Notable for the following:    pH, Arterial 7.315 (*)    pCO2 arterial 48.4 (*)    pO2, Arterial 470.0 (*)    All other components within normal limits  I-STAT CG4 LACTIC ACID, ED - Abnormal; Notable for the following:    Lactic Acid, Venous 2.87 (*)    All other components within normal limits  CULTURE, BLOOD (ROUTINE X 2)  CULTURE, BLOOD (ROUTINE X 2)  URINE CULTURE  COMPREHENSIVE METABOLIC PANEL  TROPONIN I  BRAIN NATRIURETIC PEPTIDE    EKG  EKG Interpretation None       Radiology Dg Chest Portable 1 View  Result Date: 12/05/2016 CLINICAL DATA:  Intubated EXAM: PORTABLE CHEST 1 VIEW COMPARISON:  10/10/1959 chest radiograph. FINDINGS: Endotracheal tube tip is 4.1 cm above the carina. Enteric tube terminates in the gastric fundus. Stable cardiomediastinal silhouette with normal heart size. No pneumothorax. Minimal blunting of the left costophrenic angle. No right pleural effusion. No pulmonary edema. Emphysema. No acute consolidative airspace disease. IMPRESSION: 1. Well-positioned endotracheal and enteric tubes. 2. Emphysema.  No acute pulmonary disease. 3. Minimal  blunting of the left costophrenic angle, favor chronic mild pleural-parenchymal scarring. No significant pleural effusions. Electronically Signed   By: Ilona Sorrel M.D.   On: 12/05/2016 20:10    Procedures Procedure Name: Intubation Date/Time: 12/05/2016 7:58 PM Performed by: Fatima Blank Pre-anesthesia Checklist: Patient identified, Patient being monitored, Emergency Drugs available, Suction available and Timeout performed Oxygen Delivery Method: Non-rebreather mask Preoxygenation: Pre-oxygenation with 100% oxygen Intubation Type: IV induction and Rapid sequence Laryngoscope Size: Mac and 4 Grade View: Grade I Tube size: 7.5 mm Number of attempts: 1 Airway Equipment and Method: Stylet Placement Confirmation: ETT inserted through vocal cords under direct vision,  Breath sounds checked- equal and bilateral and CO2 detector Secured at: 22 cm Tube secured with: ETT holder Difficulty Due To: Difficulty was unanticipated      (  including critical care time)  CRITICAL CARE Performed by: Grayce Sessions Keyosha Tiedt Total critical care time: 45 minutes Critical care time was exclusive of separately billable procedures and treating other patients. Critical care was necessary to treat or prevent imminent or life-threatening deterioration. Critical care was time spent personally by me on the following activities: development of treatment plan with patient and/or surrogate as well as nursing, discussions with consultants, evaluation of patient's response to treatment, examination of patient, obtaining history from patient or surrogate, ordering and performing treatments and interventions, ordering and review of laboratory studies, ordering and review of radiographic studies, pulse oximetry and re-evaluation of patient's condition.   Medications Ordered in ED Medications  fentaNYL (SUBLIMAZE) injection 50 mcg (not administered)  vancomycin (VANCOCIN) 1,500 mg in sodium chloride 0.9 % 500  mL IVPB (1,500 mg Intravenous New Bag/Given 12/05/16 2053)  etomidate (AMIDATE) injection (30 mg Intravenous Given 12/05/16 1946)  rocuronium (ZEMURON) injection (80 mg Intravenous Given 12/05/16 1947)  propofol (DIPRIVAN) 1000 MG/100ML infusion (5 mcg/kg/min  70 kg Intravenous New Bag/Given 12/05/16 2013)  piperacillin-tazobactam (ZOSYN) IVPB 3.375 g (3.375 g Intravenous New Bag/Given 12/05/16 2036)  sodium chloride 0.9 % bolus 1,000 mL (0 mLs Intravenous Stopped 12/05/16 2031)    And  sodium chloride 0.9 % bolus 1,000 mL (0 mLs Intravenous Stopped 12/05/16 2031)    And  sodium chloride 0.9 % bolus 250 mL (0 mLs Intravenous Stopped 12/05/16 2038)     Initial Impression / Assessment and Plan / ED Course  I have reviewed the triage vital signs and the nursing notes.  Pertinent labs & imaging results that were available during my care of the patient were reviewed by me and considered in my medical decision making (see chart for details).  Clinical Course     Presentation consistent with acute respiratory distress secondary to COPD exacerbation. Patient also with possible superimposed infection. Workup notable for elevated lactic acid with leukocytosis. Code sepsis initiated upon notification. Empiric antibiotics started. Following intubation patient's blood pressure dropped with systolics in the [id number] which is likely secondary to sedation however patient was placed on 30 mL/kg of IV fluid to which she responded well. Not currently requiring any pressors.  Patient was admitted to critical care for continued workup and management.   Final Clinical Impressions(s) / ED Diagnoses   Final diagnoses:  Respiratory distress  COPD exacerbation ([place])      Fatima Blank, MD 12/05/16 2351

## 2016-12-05 NOTE — ED Notes (Signed)
MD at bedside. 

## 2016-12-05 NOTE — ED Notes (Signed)
Admitting at bedside 

## 2016-12-05 NOTE — H&P (Signed)
PULMONARY / CRITICAL CARE MEDICINE   Name: FRANCOIS OLIVA MRN: [id number] DOB: 29-Dec-1945    ADMISSION DATE:  12/05/2016 CONSULTATION DATE:  12/05/16  REFERRING MD:  Leonette Monarch  CHIEF COMPLAINT:  SOB  HISTORY OF PRESENT ILLNESS:  Pt is encephelopathic; therefore, this HPI is obtained from chart review. NAZIAH URDA is a 70 y.o. male with PMH as outlined below including but not limited to COPD. He presented to [facility] ED 12/28 with worsening SOB.  Symptoms started a 2 days prior and were associated with productive cough (green sputum) and subjective fevers. Pt's son had been sick for the few days prior to that, but otherwise, no exposures to known sick contacts. On arrival to ED, pt was minimally responsive and required intubation for respiratory distress and for airway protection.  PCCM was asked to admit.  PAST MEDICAL HISTORY :  He  has a past medical history of COPD (chronic obstructive pulmonary disease) ([place]) and Hypertension.  PAST SURGICAL HISTORY: He  has no past surgical history on file.  No Known Allergies  No current facility-administered medications on file prior to encounter.    Current Outpatient Prescriptions on File Prior to Encounter  Medication Sig  . albuterol (PROVENTIL HFA;VENTOLIN HFA) 108 (90 BASE) MCG/ACT inhaler Inhale 2 puffs into the lungs every 6 (six) hours as needed for wheezing.  Marland Kitchen albuterol (PROVENTIL) (2.5 MG/3ML) 0.083% nebulizer solution Take 3 mLs (2.5 mg total) by nebulization every 6 (six) hours as needed for wheezing or shortness of breath.  Marland Kitchen aspirin 81 MG tablet Take 81 mg by mouth daily.  Marland Kitchen lisinopril (PRINIVIL,ZESTRIL) 20 MG tablet Take 1 tablet (20 mg total) by mouth daily.  . pravastatin (PRAVACHOL) 40 MG tablet TAKE 1 TABLET EVERY DAY  . predniSONE (DELTASONE) 20 MG tablet Take 3 tablets by mouth for 2 days, then 2 tablets by mouth for 2 days, then 1 tablet by mouth for 2 days  . umeclidinium-vilanterol (ANORO ELLIPTA) 62.5-25 MCG/INH  AEPB Inhale 1 puff into the lungs daily.    FAMILY HISTORY:  His has no family status information on file.    SOCIAL HISTORY: He  reports that he has been smoking Cigarettes.  He has a 12.50 pack-year smoking history. He has never used smokeless tobacco. He reports that he does not drink alcohol or use drugs.  REVIEW OF SYSTEMS:   Unable to obtain as pt is encephalopathic.  SUBJECTIVE:  On vent, unresponsive.  VITAL SIGNS: BP 106/69   Pulse 106   Temp 99.4 F (37.4 C) (Rectal)   Resp 17   Wt 154 lb 5.2 oz (70 kg)   SpO2 99%   BMI 22.79 kg/m   HEMODYNAMICS:    VENTILATOR SETTINGS: Vent Mode: PRVC FiO2 (%):  [40 %-100 %] 40 % Set Rate:  [18 bmp] 18 bmp Vt Set:  [530 mL] 530 mL PEEP:  [5 cmH20] 5 cmH20 Plateau Pressure:  [20 cmH20] 20 cmH20  INTAKE / OUTPUT: No intake/output data recorded.   PHYSICAL EXAMINATION: General: Adult male,, in NAD. Neuro: Sedated, non-responsive. HEENT: [place]/AT. PERRL, sclerae anicteric. Cardiovascular: RRR, no M/R/G.  Lungs: Respirations even and unlabored.  Faint expiratory wheeze. Abdomen: BS x 4, soft, NT/ND.  Musculoskeletal: No gross deformities, no edema.  Skin: Skin thickening and scaling to LE's and UE's, warm, no rashes.     LABS:  BMET  Recent Labs Lab 12/05/16 1945  NA 138  K 4.3  CL 100*  CO2 25  BUN 25*  CREATININE 1.22  GLUCOSE 127*    Electrolytes  Recent Labs Lab 12/05/16 1945  CALCIUM 9.3    CBC  Recent Labs Lab 12/05/16 1945  WBC 28.8*  HGB 13.1  HCT 41.6  PLT 383    Coag's No results for input(s): APTT, INR in the last 168 hours.  Sepsis Markers  Recent Labs Lab 12/05/16 2004  LATICACIDVEN 2.87*    ABG  Recent Labs Lab 12/05/16 2044  PHART 7.315*  PCO2ART 48.4*  PO2ART 470.0*    Liver Enzymes  Recent Labs Lab 12/05/16 1945  AST 40  ALT 20  ALKPHOS 85  BILITOT 1.0  ALBUMIN 3.5    Cardiac Enzymes  Recent Labs Lab 12/05/16 1945  TROPONINI 0.08*     Glucose No results for input(s): GLUCAP in the last 168 hours.  Imaging Dg Chest Portable 1 View  Result Date: 12/05/2016 CLINICAL DATA:  Intubated EXAM: PORTABLE CHEST 1 VIEW COMPARISON:  10/10/1959 chest radiograph. FINDINGS: Endotracheal tube tip is 4.1 cm above the carina. Enteric tube terminates in the gastric fundus. Stable cardiomediastinal silhouette with normal heart size. No pneumothorax. Minimal blunting of the left costophrenic angle. No right pleural effusion. No pulmonary edema. Emphysema. No acute consolidative airspace disease. IMPRESSION: 1. Well-positioned endotracheal and enteric tubes. 2. Emphysema.  No acute pulmonary disease. 3. Minimal blunting of the left costophrenic angle, favor chronic mild pleural-parenchymal scarring. No significant pleural effusions. Electronically Signed   By: Ilona Sorrel M.D.   On: 12/05/2016 20:10     STUDIES:  CXR 12/28 > no acute process.  CULTURES: Blood 12/28 > Sputum 12/28 > Flu 12/28 >  ANTIBIOTICS: Ceftriaxone 12/28 > Azithromycin 12/28 > Tamiflu 12/28 >  SIGNIFICANT EVENTS: 12/28 > admit.  LINES/TUBES: ETT 12/28 >  DISCUSSION: 70 y.o. male with hx COPD and HTN, admitted 12/28 with respiratory distress and presumed AECOPD.  Required intubation while in ED.  ASSESSMENT / PLAN:  PULMONARY A: Acute hypoxic respiratory failure. AECOPD. Concern for CAP. R/o Flu. P:   Full vent support. Wean as able. VAP prevention measures. SBT in AM if able. DuoNebs / Albuterol. Solumedrol. Abx / cultures per ID section. CXR in AM.  CARDIOVASCULAR A:  Troponin leak - suspect demand. Hx HTN. P:  Monitor hemodynamics. Trend troponin, lactate. Continue preadmission ASA, pravastatin Hold preadmission lisinopril.  RENAL A:   No acute issues. P:   NS @ 75. BMP in AM.  GASTROINTESTINAL A:   GI prophylaxis. Nutrition. P:   SUP: Pantoprazole. NPO.  HEMATOLOGIC A:   VTE Prophylaxis. P:  SCD's /  heparin. CBC in AM.  INFECTIOUS A:   AECOPD. Concern for CAP. R/o Flu. P:   Abx as above (ceftriaxone, azithromycin, tamiflu).  Follow cultures as above.  ENDOCRINE A:   No acute issues.  P:   Monitor glucose on BMP.  NEUROLOGIC A:   Acute encephalopathy. P:   Sedation:  Propofol gtt / Fentanyl gtt. RASS goal: 0 to -1. Daily WUA.  Family updated: Son, sister in law updated at bedside.  Interdisciplinary Family Meeting v Palliative Care Meeting:  Due by: 12/12/15.  CC time: 35 minutes.   Montey Hora, [facility] Pager: [phone]  or [phone] 12/05/2016, 9:29 PM  70 yo male smoker presented with several days of dyspnea, cough, wheeze, sputum, and subjective fever.  Required intubation in ER.  Several family members had similar symptoms.  RASS -5.  ETT in place.  Cachetic.  Scaling on arms/legs.  Poor air movement with faint b/l wheeze.  HR regular.  Abd soft.  CXR - no ASD ABG - pH 7.31, PCO2 48.4, PO2 470 Na 138, Creatinine 1.22, Troponin 0.08, LA 2.87, WBC 28.8  Assessment/plan:  Acute hypoxic respiratory failure 2nd to AECOPD with concern for influenza. - droplet isolation - influenza PCR - tamiflu - full vent support - solumedrol, scheduled BDs - monitor for PEEPi >> allow for permissive hypercapnia - f/u CXR  Sepsis with elevated lactic acid. - continue IV fluids - f/u lactic acid  Elevated troponin >> likely demand ischemia. - f/u cardiac enzymes  Updated pt's family at bedside  CC time by me independent of APP time 34 minutes  Chesley Mires, MD [place] 12/05/2016, 10:34 PM Pager:  [phone] After 3pm call: [phone]

## 2016-12-06 ENCOUNTER — Encounter (HOSPITAL_COMMUNITY): Payer: Self-pay

## 2016-12-06 ENCOUNTER — Inpatient Hospital Stay (HOSPITAL_COMMUNITY): Payer: Commercial Managed Care - HMO

## 2016-12-06 DIAGNOSIS — J810 Acute pulmonary edema: Secondary | ICD-10-CM

## 2016-12-06 DIAGNOSIS — G9340 Encephalopathy, unspecified: Secondary | ICD-10-CM

## 2016-12-06 DIAGNOSIS — J09X2 Influenza due to identified novel influenza A virus with other respiratory manifestations: Secondary | ICD-10-CM

## 2016-12-06 LAB — BLOOD GAS, ARTERIAL
Acid-base deficit: 3.5 mmol/L — ABNORMAL HIGH (ref 0.0–2.0)
Bicarbonate: 20.6 mmol/L (ref 20.0–28.0)
Drawn by: 441661
FIO2: 30
O2 Saturation: 97.8 %
PCO2 ART: 35.8 mmHg (ref 32.0–48.0)
PEEP: 5 cmH2O
Patient temperature: 99.5
RATE: 18 resp/min
VT: 560 mL
pH, Arterial: 7.381 (ref 7.350–7.450)
pO2, Arterial: 102 mmHg (ref 83.0–108.0)

## 2016-12-06 LAB — CBC
HCT: 30.6 % — ABNORMAL LOW (ref 39.0–52.0)
Hemoglobin: 9.9 g/dL — ABNORMAL LOW (ref 13.0–17.0)
MCH: 28.4 pg (ref 26.0–34.0)
MCHC: 32.4 g/dL (ref 30.0–36.0)
MCV: 87.7 fL (ref 78.0–100.0)
PLATELETS: 216 10*3/uL (ref 150–400)
RBC: 3.49 MIL/uL — ABNORMAL LOW (ref 4.22–5.81)
RDW: 14 % (ref 11.5–15.5)
WBC: 12.6 10*3/uL — ABNORMAL HIGH (ref 4.0–10.5)

## 2016-12-06 LAB — PHOSPHORUS: PHOSPHORUS: 2 mg/dL — AB (ref 2.5–4.6)

## 2016-12-06 LAB — BASIC METABOLIC PANEL
Anion gap: 6 (ref 5–15)
BUN: 25 mg/dL — AB (ref 6–20)
CO2: 23 mmol/L (ref 22–32)
CREATININE: 1.13 mg/dL (ref 0.61–1.24)
Calcium: 8.3 mg/dL — ABNORMAL LOW (ref 8.9–10.3)
Chloride: 107 mmol/L (ref 101–111)
GFR calc Af Amer: >60 mL/min (ref >60)
Glucose, Bld: 164 mg/dL — ABNORMAL HIGH (ref 65–99)
Potassium: 3.5 mmol/L (ref 3.5–5.1)
SODIUM: 136 mmol/L (ref 135–145)

## 2016-12-06 LAB — INFLUENZA PANEL BY PCR (TYPE A & B)
INFLAPCR: POSITIVE — AB
Influenza B By PCR: NEGATIVE

## 2016-12-06 LAB — GLUCOSE, CAPILLARY
GLUCOSE-CAPILLARY: 131 mg/dL — AB (ref 65–99)
Glucose-Capillary: 153 mg/dL — ABNORMAL HIGH (ref 65–99)
Glucose-Capillary: 130 mg/dL — ABNORMAL HIGH (ref 65–99)

## 2016-12-06 LAB — MAGNESIUM: Magnesium: 2.3 mg/dL (ref 1.7–2.4)

## 2016-12-06 LAB — TROPONIN I
TROPONIN I: 0.06 ng/mL — AB (ref <0.03)
TROPONIN I: 0.09 ng/mL — AB (ref <0.03)

## 2016-12-06 LAB — MRSA PCR SCREENING: MRSA by PCR: NEGATIVE

## 2016-12-06 LAB — LACTIC ACID, PLASMA: Lactic Acid, Venous: 3.1 mmol/L (ref 0.5–1.9)

## 2016-12-06 MED ORDER — GUAIFENESIN ER 600 MG PO TB12
600.0000 mg | ORAL_TABLET | Freq: Two times a day (BID) | ORAL | Status: DC | PRN
  Administered 2016-12-06 – 2016-12-07 (×3): 600 mg via ORAL
  Filled 2016-12-06 (×3): qty 1

## 2016-12-06 MED ORDER — SODIUM CHLORIDE 0.9% FLUSH
3.0000 mL | Freq: Two times a day (BID) | INTRAVENOUS | Status: DC
  Administered 2016-12-06 – 2016-12-11 (×9): 3 mL via INTRAVENOUS

## 2016-12-06 MED ORDER — OSELTAMIVIR PHOSPHATE 6 MG/ML PO SUSR
75.0000 mg | Freq: Two times a day (BID) | ORAL | Status: DC
  Administered 2016-12-06 – 2016-12-08 (×6): 75 mg
  Filled 2016-12-06 (×6): qty 12.5

## 2016-12-06 MED ORDER — SODIUM CHLORIDE 0.9 % IV BOLUS (SEPSIS)
500.0000 mL | Freq: Once | INTRAVENOUS | Status: AC
  Administered 2016-12-06: 500 mL via INTRAVENOUS

## 2016-12-06 MED ORDER — SODIUM CHLORIDE 0.9% FLUSH: 3.0000 mL | INTRAVENOUS | Status: DC | PRN

## 2016-12-06 NOTE — Procedures (Signed)
Extubation Procedure Note  Patient Details:   Name: William Villegas DOB: 22-Oct-1946 MRN: [id number]   Airway Documentation:     Evaluation  O2 sats: stable throughout Complications: No apparent complications Patient did tolerate procedure well. Bilateral Breath Sounds: Diminished   Yes  Elsie Stain 12/06/2016, 2:50 PM

## 2016-12-06 NOTE — Progress Notes (Signed)
PULMONARY / CRITICAL CARE MEDICINE   Name: William Villegas MRN: [id number] DOB: [birth year]    ADMISSION DATE:  12/05/2016 CONSULTATION DATE:  12/05/16  REFERRING MD:  Leonette Monarch  CHIEF COMPLAINT:  SOB  BRIEF SUMMARY: William Villegas is a 70 y.o. male with PMH of COPD who presented to [facility] ED 12/28 with worsening SOB.  Symptoms started a 2 days prior and were associated with productive cough (green sputum) and subjective fevers. Pt's son had been sick for the few days prior to that, but otherwise, no exposures to known sick contacts. On arrival to ED, pt was minimally responsive and required intubation for respiratory distress and for airway protection.  PCCM was asked to admit.  Following day, pt able to wean on PSV 5/5.     SUBJECTIVE:  RN reports pt awake / weaning on PSV 5/5.  No acute events overnight.  Tmax 100.6  VITAL SIGNS: BP 134/70   Pulse (!) 105   Temp 99.9 F (37.7 C)   Resp 14   Wt 164 lb 14.5 oz (74.8 kg)   SpO2 96%   BMI 24.35 kg/m   HEMODYNAMICS:    VENTILATOR SETTINGS: Vent Mode: CPAP;PSV FiO2 (%):  [30 %-100 %] 30 % Set Rate:  [14 bmp-18 bmp] 18 bmp Vt Set:  [530 mL-560 mL] 560 mL PEEP:  [5 cmH20] 5 cmH20 Pressure Support:  [5 cmH20-10 cmH20] 5 cmH20 Plateau Pressure:  [17 cmH20-20 cmH20] 17 cmH20  INTAKE / OUTPUT: I/O last 3 completed shifts: In: 3759.3 [I.V.:659.3; IV [id number] Out: 2200 [Urine:2200]   PHYSICAL EXAMINATION: General: Adult male, in NAD on vent Neuro: Sedated, non-responsive. HEENT: [place]/AT. PERRL, sclerae anicteric. Cardiovascular: RRR, no M/R/G.  Lungs: Respirations even and unlabored on vent, lungs bilaterally diminished  Abdomen: BS x 4, soft, NT/ND.  Musculoskeletal: No gross deformities, no edema.  Skin: Skin thickening and scaling to LE's and UE's, warm, no rashes.     LABS:  BMET  Recent Labs Lab 12/05/16 1945 12/06/16 0745  NA 138 136  K 4.3 3.5  CL 100* 107  CO2 25 23  BUN 25* 25*  CREATININE 1.22  1.13  GLUCOSE 127* 164*    Electrolytes  Recent Labs Lab 12/05/16 1945 12/06/16 0745  CALCIUM 9.3 8.3*  MG  --  2.3  PHOS  --  2.0*    CBC  Recent Labs Lab 12/05/16 1945 12/06/16 0745  WBC 28.8* 12.6*  HGB 13.1 9.9*  HCT 41.6 30.6*  PLT 383 216    Coag's No results for input(s): APTT, INR in the last 168 hours.  Sepsis Markers  Recent Labs Lab 12/05/16 2240 12/05/16 2245 12/06/16 0048  LATICACIDVEN 2.9* 2.99* 3.1*    ABG  Recent Labs Lab 12/05/16 2044 12/06/16 0412  PHART 7.315* 7.381  PCO2ART 48.4* 35.8  PO2ART 470.0* 102    Liver Enzymes  Recent Labs Lab 12/05/16 1945  AST 40  ALT 20  ALKPHOS 85  BILITOT 1.0  ALBUMIN 3.5    Cardiac Enzymes  Recent Labs Lab 12/05/16 1945 12/06/16 0048 12/06/16 0745  TROPONINI 0.08* 0.09* 0.06*    Glucose  Recent Labs Lab 12/06/16 0854 12/06/16 1136  GLUCAP 153* 131*    Imaging Dg Chest Port 1 View  Result Date: 12/06/2016 CLINICAL DATA:  ET tube, respiratory failure EXAM: PORTABLE CHEST 1 VIEW COMPARISON:  12/05/2016 FINDINGS: Emphysematous changes in the lungs. Endotracheal tube and NG tube are unchanged. No confluent airspace opacities or effusions. Heart is normal size. IMPRESSION:  Emphysema. Stable support devices. No change. Electronically Signed   By: Rolm Baptise M.D.   On: 12/06/2016 08:21   Dg Chest Portable 1 View  Result Date: 12/05/2016 CLINICAL DATA:  Intubated EXAM: PORTABLE CHEST 1 VIEW COMPARISON:  10/10/1959 chest radiograph. FINDINGS: Endotracheal tube tip is 4.1 cm above the carina. Enteric tube terminates in the gastric fundus. Stable cardiomediastinal silhouette with normal heart size. No pneumothorax. Minimal blunting of the left costophrenic angle. No right pleural effusion. No pulmonary edema. Emphysema. No acute consolidative airspace disease. IMPRESSION: 1. Well-positioned endotracheal and enteric tubes. 2. Emphysema.  No acute pulmonary disease. 3. Minimal blunting  of the left costophrenic angle, favor chronic mild pleural-parenchymal scarring. No significant pleural effusions. Electronically Signed   By: Ilona Sorrel M.D.   On: 12/05/2016 20:10     STUDIES:  CXR 12/28 > no acute process.  CULTURES: Blood 12/28 >> Sputum 12/28 >> Flu 12/28 >> Influenza A positive  ANTIBIOTICS: Ceftriaxone 12/28 > Azithromycin 12/28 > Tamiflu 12/28 >  SIGNIFICANT EVENTS: 12/28 Admit with respiratory distress, intubated. Influenza A positive.   LINES/TUBES: ETT 12/28 >  DISCUSSION: 70 y.o. male with hx COPD and HTN, admitted 12/28 with respiratory distress and presumed AECOPD.  Required intubation while in ED.    ASSESSMENT / PLAN:  PULMONARY A: Acute hypoxic respiratory failure. AECOPD. Concern for CAP. R/o Flu. P:   SBT with plan for extubation  DuoNebs / Albuterol Q6 with PRN albuterol . Solumedrol 40 mg IV Q12 Abx / cultures per ID section. Trend CXR  CARDIOVASCULAR A:  Troponin leak - suspect demand. Hx HTN. P:  Monitor hemodynamics. Trend troponin, lactate. Continue preadmission ASA, pravastatin Hold preadmission lisinopril.  RENAL A:   No acute issues. P:   Reduce NS to 50 ml/hr BMP in AM.  GASTROINTESTINAL A:   GI prophylaxis. Nutrition. P:   SUP: Pantoprazole. NPO.  HEMATOLOGIC A:   VTE Prophylaxis. P:  SCD's / heparin. Trend CBC  INFECTIOUS A:   AECOPD. Concern for CAP. Influenza A Positive P:   Abx as above (ceftriaxone, azithromycin, tamiflu).  Follow cultures as above.  ENDOCRINE A:   No acute issues.  P:   Monitor glucose on BMP.  NEUROLOGIC A:   Acute encephalopathy. P:   Sedation: D/c sedation in anticipation of extubation RASS goal: 0   Daily WUA.  Family updated: No family at bedside 12/29.  Patient updated on plan of care.   Interdisciplinary Family Meeting v Palliative Care Meeting:  Due by: 12/12/15.  CC time: 30 minutes.   Noe Gens, NP-C [facility] Pgr: [phone] or if no answer [phone] 12/06/2016, 1:43 PM  Attending Note:  70 year old male with COPD history how presents to PCCM with influenza A and VDRF.  Patient underwent treatment and is making very good progress.  On exam, patient is weaning well and lungs are clear.  Will extubate and ambulate.  I reviewed CXR myself, ETT ok.  Continue CAP coverage and tamiflu.  D/C all sedation.  PT evaluation.  KVO IVF.  Titrate O2 for sat of 88-92%.  SLP and begin diet.  Hold in the ICU overnight since we are extubating now.  Hold further diureses for now.  The patient is critically ill with multiple organ systems failure and requires high complexity decision making for assessment and support, frequent evaluation and titration of therapies, application of advanced monitoring technologies and extensive interpretation of multiple databases.   Critical Care Time devoted to  patient care services described in this note is  35  Minutes. This time reflects time of care of this signee Dr Jennet Maduro. This critical care time does not reflect procedure time, or teaching time or supervisory time of PA/NP/Med student/Med Resident etc but could involve care discussion time.  Rush Farmer, M.D. [facility] Pulmonary/Critical Care Medicine. Pager: [phone]. After hours pager: [phone].

## 2016-12-06 NOTE — Progress Notes (Signed)
Pt extubated per Doctor. Placed on 4L [place]. Pt was able to cough and talk. RT will continue to monitor

## 2016-12-07 ENCOUNTER — Encounter (HOSPITAL_COMMUNITY): Payer: Self-pay | Admitting: *Deleted

## 2016-12-07 ENCOUNTER — Inpatient Hospital Stay (HOSPITAL_COMMUNITY): Payer: Commercial Managed Care - HMO

## 2016-12-07 LAB — BASIC METABOLIC PANEL
ANION GAP: 5 (ref 5–15)
BUN: 27 mg/dL — ABNORMAL HIGH (ref 6–20)
CHLORIDE: 109 mmol/L (ref 101–111)
CO2: 24 mmol/L (ref 22–32)
Calcium: 8.7 mg/dL — ABNORMAL LOW (ref 8.9–10.3)
Creatinine, Ser: 1.01 mg/dL (ref 0.61–1.24)
GFR calc non Af Amer: >60 mL/min (ref >60)
Glucose, Bld: 118 mg/dL — ABNORMAL HIGH (ref 65–99)
POTASSIUM: 4 mmol/L (ref 3.5–5.1)
SODIUM: 138 mmol/L (ref 135–145)

## 2016-12-07 LAB — URINE CULTURE: Culture: NO GROWTH

## 2016-12-07 LAB — CBC
HEMATOCRIT: 31.2 % — AB (ref 39.0–52.0)
Hemoglobin: 10 g/dL — ABNORMAL LOW (ref 13.0–17.0)
MCH: 28.7 pg (ref 26.0–34.0)
MCHC: 32.1 g/dL (ref 30.0–36.0)
MCV: 89.7 fL (ref 78.0–100.0)
Platelets: 197 10*3/uL (ref 150–400)
RBC: 3.48 MIL/uL — ABNORMAL LOW (ref 4.22–5.81)
RDW: 14.2 % (ref 11.5–15.5)
WBC: 10.7 10*3/uL — AB (ref 4.0–10.5)

## 2016-12-07 MED ORDER — METHYLPREDNISOLONE SODIUM SUCC 40 MG IJ SOLR
40.0000 mg | INTRAMUSCULAR | Status: DC
  Administered 2016-12-08: 40 mg via INTRAVENOUS
  Filled 2016-12-07: qty 1

## 2016-12-07 MED ORDER — ARFORMOTEROL TARTRATE 15 MCG/2ML IN NEBU
15.0000 ug | INHALATION_SOLUTION | Freq: Two times a day (BID) | RESPIRATORY_TRACT | Status: DC
  Administered 2016-12-07 – 2016-12-11 (×9): 15 ug via RESPIRATORY_TRACT
  Filled 2016-12-07 (×9): qty 2

## 2016-12-07 MED ORDER — BUDESONIDE 0.5 MG/2ML IN SUSP
0.5000 mg | Freq: Two times a day (BID) | RESPIRATORY_TRACT | Status: DC
  Administered 2016-12-07 – 2016-12-11 (×9): 0.5 mg via RESPIRATORY_TRACT
  Filled 2016-12-07 (×9): qty 2

## 2016-12-07 MED ORDER — DILTIAZEM HCL 30 MG PO TABS
30.0000 mg | ORAL_TABLET | Freq: Four times a day (QID) | ORAL | Status: DC
  Administered 2016-12-07 – 2016-12-11 (×16): 30 mg via ORAL
  Filled 2016-12-07 (×18): qty 1

## 2016-12-07 MED ORDER — LISINOPRIL 10 MG PO TABS
10.0000 mg | ORAL_TABLET | Freq: Every day | ORAL | Status: DC
  Administered 2016-12-07 – 2016-12-09 (×3): 10 mg via ORAL
  Filled 2016-12-07 (×3): qty 1

## 2016-12-07 NOTE — Progress Notes (Signed)
PULMONARY / CRITICAL CARE MEDICINE   Name: William Villegas MRN: [id number] DOB: 25-Sep-1946    ADMISSION DATE:  12/05/2016 CONSULTATION DATE:  12/05/16  REFERRING MD:  Leonette Monarch  CHIEF COMPLAINT:  SOB  BRIEF SUMMARY: William Villegas is a 70 y.o. male with PMH of COPD who presented to [facility] ED 12/28 with worsening SOB.  Symptoms started a 2 days prior and were associated with productive cough (green sputum) and subjective fevers. Pt's son had been sick for the few days prior to that, but otherwise, no exposures to known sick contacts. On arrival to ED, pt was minimally responsive and required intubation for respiratory distress and for airway protection.  PCCM was asked to admit.  Following day, pt able to wean on PSV 5/5.     SUBJECTIVE:   Extubated 12/29 Feels better Doesn't want to eat or get out of bed  VITAL SIGNS: BP 122/84   Pulse 82   Temp 98.4 F (36.9 C) (Oral)   Resp 15   Wt 166 lb 10.7 oz (75.6 kg)   SpO2 97%   BMI 24.61 kg/m   HEMODYNAMICS:    VENTILATOR SETTINGS: Vent Mode: CPAP;PSV FiO2 (%):  [3 %-30 %] 3 % PEEP:  [5 cmH20] 5 cmH20 Pressure Support:  [5 cmH20] 5 cmH20  INTAKE / OUTPUT: I/O last 3 completed shifts: In: 5463.5 [I.V.:2063.5; IV Piggyback:3400] Out: [id number] [Urine:3220]   PHYSICAL EXAMINATION: Gen: no distress in bed HENT: NCAT, OP clear, poor dentition PULM: few wheezes, normal air movement CV: RRR, no mgr, trace edema GI: BS+, soft, nontender Derm: no cyanosis or rash Psyche: normal mood and affect   LABS:  BMET  Recent Labs Lab 12/05/16 1945 12/06/16 0745 12/07/16 0401  NA 138 136 138  K 4.3 3.5 4.0  CL 100* 107 109  CO2 25 23 24   BUN 25* 25* 27*  CREATININE 1.22 1.13 1.01  GLUCOSE 127* 164* 118*    Electrolytes  Recent Labs Lab 12/05/16 1945 12/06/16 0745 12/07/16 0401  CALCIUM 9.3 8.3* 8.7*  MG  --  2.3  --   PHOS  --  2.0*  --     CBC  Recent Labs Lab 12/05/16 1945 12/06/16 0745 12/07/16 0401  WBC  28.8* 12.6* 10.7*  HGB 13.1 9.9* 10.0*  HCT 41.6 30.6* 31.2*  PLT 383 216 197    Coag's No results for input(s): APTT, INR in the last 168 hours.  Sepsis Markers  Recent Labs Lab 12/05/16 2240 12/05/16 2245 12/06/16 0048  LATICACIDVEN 2.9* 2.99* 3.1*    ABG  Recent Labs Lab 12/05/16 2044 12/06/16 0412  PHART 7.315* 7.381  PCO2ART 48.4* 35.8  PO2ART 470.0* 102    Liver Enzymes  Recent Labs Lab 12/05/16 1945  AST 40  ALT 20  ALKPHOS 85  BILITOT 1.0  ALBUMIN 3.5    Cardiac Enzymes  Recent Labs Lab 12/05/16 1945 12/06/16 0048 12/06/16 0745  TROPONINI 0.08* 0.09* 0.06*    Glucose  Recent Labs Lab 12/06/16 0854 12/06/16 1136 12/06/16 1631  GLUCAP 153* 131* 130*    Imaging Dg Chest Port 1 View  Result Date: 12/07/2016 CLINICAL DATA:  Acute respiratory failure with hypoxia EXAM: PORTABLE CHEST 1 VIEW COMPARISON:  12/06/2016 FINDINGS: Interval extubation and removal of NG tube. Increasing right lower lobe atelectasis or infiltrate. No confluent opacity on the left. Heart is normal size. No effusions. IMPRESSION: Increasing right base atelectasis or infiltrate. Electronically Signed   By: Rolm Baptise M.D.   On:  12/07/2016 08:25     STUDIES:  CXR 12/28 > no acute process. CXR 12/30> increasing R base atelectasis vs infiltrate  CULTURES: Blood 12/28 >> Sputum 12/28 >> Flu 12/28 >> Influenza A positive  ANTIBIOTICS: Ceftriaxone 12/28 > Azithromycin 12/28 > Tamiflu 12/28 >  SIGNIFICANT EVENTS: 12/28 Admit with respiratory distress, intubated. Influenza A positive.   LINES/TUBES: ETT 12/28 > 12/29  DISCUSSION: 70 y.o. male with hx COPD and HTN, admitted 12/28 with respiratory distress and presumed AECOPD.  Required intubation while in ED.  He has influenza A and likely CAP.  ASSESSMENT / PLAN:  PULMONARY A: Acute hypoxic respiratory failure> resolved AECOPD CAP P:   Wean solumedrol Continue duoneb Add brovana/pulmicort> change  to anoro at discahrge  CARDIOVASCULAR A:  Troponin leak - suspect demand Hx HTN P:  Tele monitoring Add back home lisinopril  RENAL A:   No acute issues P:   KVO IVF Monitor BMET and UOP Replace electrolytes as needed   GASTROINTESTINAL A:   Nutrition P:   Advance diet  HEMATOLOGIC A:   VTE Prophylaxis P:  SCD's / heparin Trend CBC  INFECTIOUS A:   AECOPD CAP Influenza A Positive P:   Abx as above (ceftriaxone, azithromycin, tamiflu) Follow cultures as above  ENDOCRINE A:   No acute issues P:   Monitor glucose on BMP  NEUROLOGIC A:   Acute encephalopathy> resolved Deconditioning P:   PT Consult Out of bed  Family updated: No family at bedside 12/29.  Patient updated on plan of care.   Interdisciplinary Family Meeting v Palliative Care Meeting:  Due by: 12/12/15.  Transfer out of ICU, [place] service, med surg  Roselie Awkward, MD [place] [facility] Pager: [phone] Cell: [phone] After 3pm or if no response, call [phone]

## 2016-12-07 NOTE — Progress Notes (Signed)
[place] Progress Note Patient Name: William Villegas DOB: [birth year] MRN: [id number]   Date of Service  12/07/2016  HPI/Events of Note  Increased HR to 180 (sinus tach) on minimal exertion. Severe deconditioning vs increased respiratory distress vs both. Patient with COPD and Influenza A. BP = 172/94 and HR = 124 (sinus tach). Sat = 96% and RR = 18.  eICU Interventions  ( Will order: 1. Cardizem 30 mg PO Q 6 hours. Hold for SBP < 110 or HR < 60.     Intervention Category Major Interventions: Arrhythmia - evaluation and management  Braven Wolk [place] 12/07/2016, 4:18 PM

## 2016-12-07 NOTE — Plan of Care (Signed)
Problem: Respiratory: Goal: Respiratory status will improve Outcome: Progressing Pt able to wean to extubate 12-06-16 to 4 L [place]. Pt able to demonstrate pulmonary toilet with I.S. = 750 and coughing. Maintaining O2 sats > 92% without signs of increase in W.O.B.

## 2016-12-08 DIAGNOSIS — J189 Pneumonia, unspecified organism: Secondary | ICD-10-CM

## 2016-12-08 DIAGNOSIS — R0603 Acute respiratory distress: Secondary | ICD-10-CM

## 2016-12-08 DIAGNOSIS — I10 Essential (primary) hypertension: Secondary | ICD-10-CM

## 2016-12-08 DIAGNOSIS — R0600 Dyspnea, unspecified: Secondary | ICD-10-CM

## 2016-12-08 DIAGNOSIS — G9340 Encephalopathy, unspecified: Secondary | ICD-10-CM

## 2016-12-08 DIAGNOSIS — J181 Lobar pneumonia, unspecified organism: Secondary | ICD-10-CM

## 2016-12-08 LAB — BASIC METABOLIC PANEL
Anion gap: 5 (ref 5–15)
BUN: 37 mg/dL — ABNORMAL HIGH (ref 6–20)
CHLORIDE: 108 mmol/L (ref 101–111)
CO2: 26 mmol/L (ref 22–32)
CREATININE: 1.11 mg/dL (ref 0.61–1.24)
Calcium: 8.7 mg/dL — ABNORMAL LOW (ref 8.9–10.3)
Glucose, Bld: 117 mg/dL — ABNORMAL HIGH (ref 65–99)
POTASSIUM: 3.8 mmol/L (ref 3.5–5.1)
SODIUM: 139 mmol/L (ref 135–145)

## 2016-12-08 MED ORDER — ASPIRIN 81 MG PO CHEW
81.0000 mg | CHEWABLE_TABLET | Freq: Every day | ORAL | Status: DC
  Administered 2016-12-09 – 2016-12-11 (×3): 81 mg via ORAL
  Filled 2016-12-08 (×3): qty 1

## 2016-12-08 MED ORDER — PRAVASTATIN SODIUM 40 MG PO TABS
40.0000 mg | ORAL_TABLET | Freq: Every day | ORAL | Status: DC
  Administered 2016-12-08 – 2016-12-10 (×3): 40 mg via ORAL
  Filled 2016-12-08 (×3): qty 1

## 2016-12-08 MED ORDER — IPRATROPIUM-ALBUTEROL 0.5-2.5 (3) MG/3ML IN SOLN
3.0000 mL | Freq: Four times a day (QID) | RESPIRATORY_TRACT | Status: DC
  Administered 2016-12-09 – 2016-12-10 (×5): 3 mL via RESPIRATORY_TRACT
  Filled 2016-12-08 (×4): qty 3

## 2016-12-08 MED ORDER — DM-GUAIFENESIN ER 30-600 MG PO TB12
1.0000 | ORAL_TABLET | Freq: Two times a day (BID) | ORAL | Status: DC
  Administered 2016-12-08 – 2016-12-11 (×7): 1 via ORAL
  Filled 2016-12-08 (×8): qty 1

## 2016-12-08 MED ORDER — OSELTAMIVIR PHOSPHATE 6 MG/ML PO SUSR
75.0000 mg | Freq: Two times a day (BID) | ORAL | Status: AC
  Administered 2016-12-08 – 2016-12-10 (×4): 75 mg via ORAL
  Filled 2016-12-08 (×4): qty 12.5

## 2016-12-08 MED ORDER — METHYLPREDNISOLONE SODIUM SUCC 125 MG IJ SOLR
60.0000 mg | INTRAMUSCULAR | Status: DC
  Administered 2016-12-09: 60 mg via INTRAVENOUS
  Filled 2016-12-08: qty 2

## 2016-12-08 MED ORDER — AZITHROMYCIN 250 MG PO TABS
500.0000 mg | ORAL_TABLET | Freq: Every day | ORAL | Status: DC
  Administered 2016-12-08 – 2016-12-10 (×3): 500 mg via ORAL
  Filled 2016-12-08 (×3): qty 2
  Filled 2016-12-08: qty 1

## 2016-12-08 NOTE — Progress Notes (Signed)
PROGRESS NOTE    William Villegas  [id number] DOB: [birth year] DOA: 12/05/2016 PCP: Odette Fraction, MD   Brief Narrative:  70 y.o. WM PMHx COPD. HTN   He presented to [facility] ED 12/28 with worsening SOB.  Symptoms started a 2 days prior and were associated with productive cough (green sputum) and subjective fevers. Pt's son had been sick for the few days prior to that, but otherwise, no exposures to known sick contacts. On arrival to ED, pt was minimally responsive and required intubation for respiratory distress and for airway protection.  PCCM was asked to admit.   Subjective: 12/31 A/O 4. States does not use home O2. Positive acute respiratory failure with hypoxia. Positive SOB, positive DOE, negative CP. States positive productive sputum yesterday.   Assessment & Plan:   Active Problems:   Acute respiratory failure with hypoxia (HCC)   Influenza due to identified novel influenza A virus with other respiratory manifestations   Acute respiratory failure with hypoxia  -Complete 5 day course of antibiotics -Flutter valve -Solu-Medrol 60 mg daily -DuoNeb QID -Brovana neb BID -Pulmicort neb BID -Mucinex DM -Change brovana/pulmicort> change to anoro at discahrge   COPD exacerbation/CAP Influenza A Positive -See respiratory failure  Troponin leak  - suspect demand continue to trend  HTN -Tele monitoring -Cardizem 30 mg  QID -Lisinopril 10 mg daily  Acute encephalopathy -> resolved  Deconditioning -PT; recommends 24-hour home supervision.  OT consult pending     DVT prophylaxis: SCD's / heparin  Code Status: Full Family Communication: None Disposition Plan: Resolution CAP   Consultants:  [facility]  Procedures/Significant Events:  CXR 12/28 > no acute process. CXR 12/30> increasing R base atelectasis vs infiltrate   VENTILATOR SETTINGS:    Cultures Blood 12/28 >> Sputum 12/28 >> Flu 12/28 >> Influenza A  positive  Antimicrobials: Anti-infectives    Start     Stop   12/06/16 1000  vancomycin (VANCOCIN) IVPB 750 mg/150 ml premix  Status:  Discontinued     12/05/16 2206   12/06/16 0230  piperacillin-tazobactam (ZOSYN) IVPB 3.375 g  Status:  Discontinued     12/05/16 2206   12/06/16 0145  oseltamivir (TAMIFLU) 6 MG/ML suspension 75 mg     12/10/16 2159   12/05/16 2230  oseltamivir (TAMIFLU) capsule 75 mg  Status:  Discontinued     12/06/16 0123   12/05/16 2230  cefTRIAXone (ROCEPHIN) 1 g in dextrose 5 % 50 mL IVPB         12/05/16 2230  azithromycin (ZITHROMAX) 500 mg in dextrose 5 % 250 mL IVPB         12/05/16 2030  vancomycin (VANCOCIN) 1,500 mg in sodium chloride 0.9 % 500 mL IVPB     12/05/16 2256   12/05/16 2015  piperacillin-tazobactam (ZOSYN) IVPB 3.375 g     12/05/16 2109   12/05/16 2015  vancomycin (VANCOCIN) IVPB 1000 mg/200 mL premix  Status:  Discontinued     12/05/16 2016       Devices    LINES / TUBES:  ETT 12/28 > 12/29    Continuous Infusions:   Objective: Vitals:   12/08/16 0300 12/08/16 0330 12/08/16 0400 12/08/16 0500  BP:   (!) 121/58   Pulse: 73  76 63  Resp: 16  14 15   Temp:  97.9 F (36.6 C)    TempSrc:  Oral    SpO2: 97%  99% 98%  Weight:        Intake/Output Summary (Last 24  hours) at 12/08/16 0533 Last data filed at 12/08/16 0400  Gross per 24 hour  Intake              460 ml  Output             1085 ml  Net             -625 ml   Filed Weights   12/05/16 2000 12/06/16 0214 12/07/16 0600  Weight: 70 kg (154 lb 5.2 oz) 74.8 kg (164 lb 14.5 oz) 75.6 kg (166 lb 10.7 oz)    Examination:  General: A/O 4, positive acute respiratory distress, cachectic Eyes: negative scleral hemorrhage, negative anisocoria, negative icterus ENT: Negative Runny nose, negative gingival bleeding, Neck:  Negative scars, masses, torticollis, lymphadenopathy, JVD Lungs: tachypnea, diffuse poor air movement, diffuse expiratory wheeze, negative crackles   Cardiovascular: Tachycardic, Regular rhythm without murmur gallop or rub normal S1 and S2 Abdomen: negative abdominal pain, nondistended, positive soft, bowel sounds, no rebound, no ascites, no appreciable mass Extremities: No significant cyanosis, clubbing, or edema bilateral lower extremities Skin: Negative rashes, lesions, ulcers Psychiatric:  Negative depression, negative anxiety, negative fatigue, negative mania  Central nervous system:  Cranial nerves II through XII intact, tongue/uvula midline, all extremities muscle strength 5/5, sensation intact throughout, negative dysarthria, negative expressive aphasia, negative receptive aphasia.  .     Data Reviewed: Care during the described time interval was provided by me .  I have reviewed this patient's available data, including medical history, events of note, physical examination, and all test results as part of my evaluation. I have personally reviewed and interpreted all radiology studies.  CBC:  Recent Labs Lab 12/05/16 1945 12/06/16 0745 12/07/16 0401  WBC 28.8* 12.6* 10.7*  NEUTROABS 23.4*  --   --   HGB 13.1 9.9* 10.0*  HCT 41.6 30.6* 31.2*  MCV 92.2 87.7 89.7  PLT 383 216 [id number]   Basic Metabolic Panel:  Recent Labs Lab 12/05/16 1945 12/06/16 0745 12/07/16 0401 12/08/16 0253  NA 138 136 138 139  K 4.3 3.5 4.0 3.8  CL 100* 107 109 108  CO2 25 23 24 26   GLUCOSE 127* 164* 118* 117*  BUN 25* 25* 27* 37*  CREATININE 1.22 1.13 1.01 1.11  CALCIUM 9.3 8.3* 8.7* 8.7*  MG  --  2.3  --   --   PHOS  --  2.0*  --   --    GFR: Estimated Creatinine Clearance: 60.2 mL/min (by C-G formula based on SCr of 1.11 mg/dL). Liver Function Tests:  Recent Labs Lab 12/05/16 1945  AST 40  ALT 20  ALKPHOS 85  BILITOT 1.0  PROT 6.9  ALBUMIN 3.5   No results for input(s): LIPASE, AMYLASE in the last 168 hours. No results for input(s): AMMONIA in the last 168 hours. Coagulation Profile: No results for input(s): INR, PROTIME  in the last 168 hours. Cardiac Enzymes:  Recent Labs Lab 12/05/16 1945 12/06/16 0048 12/06/16 0745  TROPONINI 0.08* 0.09* 0.06*   BNP (last 3 results) No results for input(s): PROBNP in the last 8760 hours. HbA1C: No results for input(s): HGBA1C in the last 72 hours. CBG:  Recent Labs Lab 12/06/16 0854 12/06/16 1136 12/06/16 1631  GLUCAP 153* 131* 130*   Lipid Profile:  Recent Labs  12/05/16 2240  TRIG 65   Thyroid Function Tests: No results for input(s): TSH, T4TOTAL, FREET4, T3FREE, THYROIDAB in the last 72 hours. Anemia Panel: No results for input(s): VITAMINB12, FOLATE, FERRITIN, TIBC, IRON,  RETICCTPCT in the last 72 hours. Urine analysis:    Component Value Date/Time   COLORURINE YELLOW 12/05/2016 2012   APPEARANCEUR CLEAR 12/05/2016 2012   LABSPEC 1.028 12/05/2016 2012   PHURINE 5.0 12/05/2016 2012   GLUCOSEU NEGATIVE 12/05/2016 2012   HGBUR SMALL (A) 12/05/2016 2012   [place] NEGATIVE 12/05/2016 2012   KETONESUR 20 (A) 12/05/2016 2012   PROTEINUR 100 (A) 12/05/2016 2012   NITRITE NEGATIVE 12/05/2016 2012   LEUKOCYTESUR NEGATIVE 12/05/2016 2012   Sepsis Labs: @LABRCNTIP (procalcitonin:4,lacticidven:4)  ) Recent Results (from the past 240 hour(s))  Urine culture     Status: None   Collection Time: 12/05/16  8:12 PM  Result Value Ref Range Status   Specimen Description URINE, CATHETERIZED  Final   Special Requests NONE  Final   Culture NO GROWTH  Final   Report Status 12/07/2016 FINAL  Final  Blood Culture (routine x 2)     Status: None (Preliminary result)   Collection Time: 12/05/16  8:27 PM  Result Value Ref Range Status   Specimen Description BLOOD RIGHT HAND  Final   Special Requests IN PEDIATRIC BOTTLE 2.5CC  Final   Culture NO GROWTH 2 DAYS  Final   Report Status PENDING  Incomplete  Blood Culture (routine x 2)     Status: None (Preliminary result)   Collection Time: 12/05/16  8:35 PM  Result Value Ref Range Status   Specimen  Description BLOOD LEFT HAND  Final   Special Requests BOTTLES DRAWN AEROBIC AND ANAEROBIC 5CC  Final   Culture NO GROWTH 2 DAYS  Final   Report Status PENDING  Incomplete  MRSA PCR Screening     Status: None   Collection Time: 12/06/16  1:03 AM  Result Value Ref Range Status   MRSA by PCR NEGATIVE NEGATIVE Final    Comment:        The GeneXpert MRSA Assay (FDA approved for NASAL specimens only), is one component of a comprehensive MRSA colonization surveillance program. It is not intended to diagnose MRSA infection nor to guide or monitor treatment for MRSA infections.          Radiology Studies: Dg Chest Port 1 View  Result Date: 12/07/2016 CLINICAL DATA:  Acute respiratory failure with hypoxia EXAM: PORTABLE CHEST 1 VIEW COMPARISON:  12/06/2016 FINDINGS: Interval extubation and removal of NG tube. Increasing right lower lobe atelectasis or infiltrate. No confluent opacity on the left. Heart is normal size. No effusions. IMPRESSION: Increasing right base atelectasis or infiltrate. Electronically Signed   By: Rolm Baptise M.D.   On: 12/07/2016 08:25   Dg Chest Port 1 View  Result Date: 12/06/2016 CLINICAL DATA:  ET tube, respiratory failure EXAM: PORTABLE CHEST 1 VIEW COMPARISON:  12/05/2016 FINDINGS: Emphysematous changes in the lungs. Endotracheal tube and NG tube are unchanged. No confluent airspace opacities or effusions. Heart is normal size. IMPRESSION: Emphysema. Stable support devices. No change. Electronically Signed   By: Rolm Baptise M.D.   On: 12/06/2016 08:21        Scheduled Meds: . arformoterol  15 mcg Nebulization BID  . aspirin  81 mg Per Tube Daily  . azithromycin  500 mg Intravenous Q24H  . budesonide (PULMICORT) nebulizer solution  0.5 mg Nebulization BID  . cefTRIAXone (ROCEPHIN)  IV  1 g Intravenous Q24H  . diltiazem  30 mg Oral Q6H  . heparin  5,000 Units Subcutaneous Q8H  . ipratropium-albuterol  3 mL Nebulization Q6H  . lisinopril  10 mg Oral  Daily  . methylPREDNISolone (SOLU-MEDROL) injection  40 mg Intravenous Q24H  . oseltamivir  75 mg Per Tube BID  . pravastatin  40 mg Per Tube q1800  . sodium chloride flush  3 mL Intravenous Q12H   Continuous Infusions:   LOS: 3 days    Time spent: 40 minutes    Areebah Meinders, Geraldo Docker, MD [facility] Pager [phone]   If 7PM-7AM, please contact night-coverage [email or web address] Password [facility] 12/08/2016, 5:33 AM

## 2016-12-08 NOTE — Progress Notes (Signed)
Pt given sterile sputum cup for collection, pt not currently able to cough up sputum.  Pt given instructions on sputum collection.

## 2016-12-08 NOTE — Evaluation (Signed)
Physical Therapy Evaluation Patient Details Name: William Villegas MRN: [id number] DOB: Feb 11, 1946 Today's Date: 12/08/2016   History of Present Illness  William Villegas is a 70 y.o. male with PMH as outlined below including but not limited to COPD. He presented to [facility] ED 12/28 with worsening SOB.  Symptoms started a 2 days prior and were associated with productive cough (green sputum) and subjective fevers. Pt + for flu.  Clinical Impression  Pt admitted with above diagnosis. Pt currently with functional limitations due to the deficits listed below (see PT Problem List). Pt currently limited by SOB, now on 3LO2 via [place] but wasn't on O2 PTA. Pt with decreased activity tolerance due to pulmonary status. Pt will benefit from skilled PT to increase their independence and safety with mobility to allow discharge to the venue listed below.       Follow Up Recommendations No PT follow up;Supervision/Assistance - 24 hour (initially)    Equipment Recommendations  Rolling walker with 5" wheels    Recommendations for Other Services       Precautions / Restrictions Precautions Precautions: Fall Precaution Comments: SOB, watch SpO2 Restrictions Weight Bearing Restrictions: No      Mobility  Bed Mobility               General bed mobility comments: pt sitting up in chair  Transfers Overall transfer level: Needs assistance Equipment used: Rolling walker (2 wheeled) Transfers: Sit to/from Stand Sit to Stand: Min guard         General transfer comment: v/c's for hand placement  Ambulation/Gait Ambulation/Gait assistance: Min assist Ambulation Distance (Feet): 150 Feet Assistive device: Rolling walker (2 wheeled) Gait Pattern/deviations: Step-through pattern;Decreased stride length Gait velocity: slow Gait velocity interpretation: Below normal speed for age/gender General Gait Details: + SOB, SpO2 >91% on 3LO2 via [place]. 2 standing rest breaks, no episodes of LOB  Stairs             Wheelchair Mobility    Modified Rankin (Stroke Patients Only)       Balance Overall balance assessment: Needs assistance         Standing balance support: No upper extremity supported Standing balance-Leahy Scale: Fair Standing balance comment: benefits from RW to steady self                             Pertinent Vitals/Pain Pain Assessment: No/denies pain    Home Living Family/patient expects to be discharged to:: Private residence Living Arrangements: Children Available Help at Discharge: Family;Available PRN/intermittently (son works during the day) Type of Home:  (trailer) Home Access: Stairs to enter Entrance Stairs-Rails: Right Technical brewer of Steps: 3 Home Layout: One level        Prior Function Level of Independence: Independent         Comments: was driving     Hand Dominance   Dominant Hand: Right    Extremity/Trunk Assessment   Upper Extremity Assessment Upper Extremity Assessment: Generalized weakness    Lower Extremity Assessment Lower Extremity Assessment: Generalized weakness    Cervical / Trunk Assessment Cervical / Trunk Assessment: Normal  Communication   Communication: No difficulties  Cognition Arousal/Alertness: Awake/alert Behavior During Therapy: WFL for tasks assessed/performed Overall Cognitive Status: Within Functional Limits for tasks assessed                      General Comments      Exercises  Assessment/Plan    PT Assessment Patient needs continued PT services  PT Problem List Decreased strength;Decreased activity tolerance;Decreased mobility;Decreased balance          PT Treatment Interventions DME instruction;Gait training;Stair training;Functional mobility training;Therapeutic activities;Therapeutic exercise;Balance training    PT Goals (Current goals can be found in the Care Plan section)  Acute Rehab PT Goals Patient Stated Goal: home PT Goal  Formulation: With patient Time For Goal Achievement: 12/15/16 Potential to Achieve Goals: Good    Frequency Min 3X/week   Barriers to discharge        Co-evaluation               End of Session Equipment Utilized During Treatment: Gait belt Activity Tolerance: Patient tolerated treatment well Patient left: in chair;with call bell/phone within reach Nurse Communication: Mobility status         Time: [id number] PT Time Calculation (min) (ACUTE ONLY): 22 min   Charges:   PT Evaluation $PT Eval Moderate Complexity: 1 Procedure     PT G Codes:        D'Arcy Abraha M Kathreen Dileo 12/08/2016, 2:43 PM   Kittie Plater, PT, DPT Pager #: [phone] Office #: [phone]

## 2016-12-08 NOTE — Progress Notes (Signed)
PHARMACIST - PHYSICIAN COMMUNICATION  CONCERNING: Antibiotic IV to Oral Route Change Policy  RECOMMENDATION: This patient is receiving azithromycin by the intravenous route.  Based on criteria approved by the Pharmacy and Therapeutics Committee, the antibiotic(s) is/are being converted to the equivalent oral dose form(s).   DESCRIPTION: These criteria include:  Patient being treated for a respiratory tract infection, urinary tract infection, cellulitis or clostridium difficile associated diarrhea if on metronidazole  The patient is not neutropenic and does not exhibit a GI malabsorption state  The patient is eating (either orally or via tube) and/or has been taking other orally administered medications for a least 24 hours  The patient is improving clinically and has a Tmax < 100.5  If you have questions about this conversion, please contact the Pharmacy Department  []   [phone] )  [facility] []   [phone] )  [facility] [x]   [phone] )  [facility] []   [phone] )  [facility] []   [phone] )  [place], [place] D 12/08/2016 12:09 PM

## 2016-12-09 ENCOUNTER — Inpatient Hospital Stay (HOSPITAL_COMMUNITY): Payer: Commercial Managed Care - HMO

## 2016-12-09 LAB — BASIC METABOLIC PANEL
ANION GAP: 9 (ref 5–15)
BUN: 43 mg/dL — AB (ref 6–20)
CALCIUM: 8.8 mg/dL — AB (ref 8.9–10.3)
CO2: 25 mmol/L (ref 22–32)
CREATININE: 1.24 mg/dL (ref 0.61–1.24)
Chloride: 107 mmol/L (ref 101–111)
GFR calc Af Amer: >60 mL/min (ref >60)
GFR, EST NON AFRICAN AMERICAN: 56 mL/min — AB (ref >60)
GLUCOSE: 117 mg/dL — AB (ref 65–99)
Potassium: 4.5 mmol/L (ref 3.5–5.1)
Sodium: 141 mmol/L (ref 135–145)

## 2016-12-09 LAB — CBC WITH DIFFERENTIAL/PLATELET
BASOS PCT: 0 %
Basophils Absolute: 0 10*3/uL (ref 0.0–0.1)
EOS ABS: 0 10*3/uL (ref 0.0–0.7)
EOS PCT: 0 %
HCT: 32.3 % — ABNORMAL LOW (ref 39.0–52.0)
HEMOGLOBIN: 10.5 g/dL — AB (ref 13.0–17.0)
LYMPHS ABS: 0.3 10*3/uL — AB (ref 0.7–4.0)
Lymphocytes Relative: 4 %
MCH: 28.8 pg (ref 26.0–34.0)
MCHC: 32.5 g/dL (ref 30.0–36.0)
MCV: 88.7 fL (ref 78.0–100.0)
MONO ABS: 0.6 10*3/uL (ref 0.1–1.0)
MONOS PCT: 7 %
NEUTROS PCT: 89 %
Neutro Abs: 6.9 10*3/uL (ref 1.7–7.7)
Platelets: 203 10*3/uL (ref 150–400)
RBC: 3.64 MIL/uL — ABNORMAL LOW (ref 4.22–5.81)
RDW: 14 % (ref 11.5–15.5)
WBC: 7.8 10*3/uL (ref 4.0–10.5)

## 2016-12-09 LAB — TROPONIN I: TROPONIN I: 0.04 ng/mL — AB (ref <0.03)

## 2016-12-09 LAB — C DIFFICILE QUICK SCREEN W PCR REFLEX
C DIFFICILE (CDIFF) INTERP: NOT DETECTED
C DIFFICILE (CDIFF) TOXIN: NEGATIVE
C DIFFICLE (CDIFF) ANTIGEN: NEGATIVE

## 2016-12-09 LAB — MAGNESIUM: Magnesium: 2.4 mg/dL (ref 1.7–2.4)

## 2016-12-09 MED ORDER — SODIUM CHLORIDE 0.9 % IV SOLN: 250.0000 mL | INTRAVENOUS | Status: DC | PRN

## 2016-12-09 MED ORDER — PREDNISONE 20 MG PO TABS
50.0000 mg | ORAL_TABLET | Freq: Every day | ORAL | Status: DC
  Administered 2016-12-10 – 2016-12-11 (×2): 50 mg via ORAL
  Filled 2016-12-09 (×2): qty 2

## 2016-12-09 MED ORDER — IOPAMIDOL (ISOVUE-370) INJECTION 76%
INTRAVENOUS | Status: AC
  Administered 2016-12-09: 100 mL via INTRAVENOUS
  Filled 2016-12-09: qty 100

## 2016-12-09 MED ORDER — SODIUM CHLORIDE 0.9 % IV SOLN
INTRAVENOUS | Status: AC
  Administered 2016-12-09: 11:00:00 via INTRAVENOUS

## 2016-12-09 NOTE — Progress Notes (Signed)
PROGRESS NOTE    REEDER PROA  [id number] DOB: [birth year] DOA: 12/05/2016 PCP: Odette Fraction, MD   Brief Narrative:  71 y.o. WM PMHx COPD. HTN   He presented to [facility] ED 12/28 with worsening SOB.  Symptoms started a 2 days prior and were associated with productive cough (green sputum) and subjective fevers. Pt's son had been sick for the few days prior to that, but otherwise, no exposures to known sick contacts. On arrival to ED, pt was minimally responsive and required intubation for respiratory distress and for airway protection.  PCCM was asked to admit.   Subjective:  Concerned about diarrhea starting yesterday, described as dark and runny Continues to be hypoxic and tachycardic with ambulation   Assessment & Plan:   Active Problems:   Acute respiratory failure with hypoxia (HCC)   Influenza due to identified novel influenza A virus with other respiratory manifestations   Respiratory distress   Essential hypertension   Acute encephalopathy   Community acquired pneumonia of right lower lobe of lung ([place])   Acute respiratory failure with hypoxia  -Complete 5 day course of antibiotics -Flutter valve -Solu-Medrol 60 mg daily, change to PO prednisone -DuoNeb QID -Brovana neb BID -Pulmicort neb BID -Mucinex DM -Change brovana/pulmicort> change to anoro at discahrge CT PE study no evidence of PE, COPD/5 accessory, suspected bronchopneumonia Echo to rule out CHF  COPD exacerbation/CAP Influenza A Positive -See respiratory failure  Troponin leak  - suspect demand continue to trend  HTN -Tele monitoring -Cardizem 30 mg  QID -Lisinopril 10 mg daily  Acute encephalopathy -> resolved  Deconditioning -PT; recommends 24-hour home supervision.  OT consult pending  2.2 cm mass posterior segment of right liver lobe Will need outpatient MRI for further evaluation   DVT prophylaxis: SCD's / heparin  Code Status: Full Family Communication:  None Disposition Plan: 1-2 days   Consultants:  [facility]  Procedures/Significant Events:  CXR 12/28 > no acute process. CXR 12/30> increasing R base atelectasis vs infiltrate   VENTILATOR SETTINGS:    Cultures Blood 12/28 >> Sputum 12/28 >> Flu 12/28 >> Influenza A positive  Antimicrobials: Anti-infectives    Start     Stop   12/06/16 1000  vancomycin (VANCOCIN) IVPB 750 mg/150 ml premix  Status:  Discontinued     12/05/16 2206   12/06/16 0230  piperacillin-tazobactam (ZOSYN) IVPB 3.375 g  Status:  Discontinued     12/05/16 2206   12/06/16 0145  oseltamivir (TAMIFLU) 6 MG/ML suspension 75 mg     12/10/16 2159   12/05/16 2230  oseltamivir (TAMIFLU) capsule 75 mg  Status:  Discontinued     12/06/16 0123   12/05/16 2230  cefTRIAXone (ROCEPHIN) 1 g in dextrose 5 % 50 mL IVPB         12/05/16 2230  azithromycin (ZITHROMAX) 500 mg in dextrose 5 % 250 mL IVPB         12/05/16 2030  vancomycin (VANCOCIN) 1,500 mg in sodium chloride 0.9 % 500 mL IVPB     12/05/16 2256   12/05/16 2015  piperacillin-tazobactam (ZOSYN) IVPB 3.375 g     12/05/16 2109   12/05/16 2015  vancomycin (VANCOCIN) IVPB 1000 mg/200 mL premix  Status:  Discontinued     12/05/16 2016       Devices    LINES / TUBES:  ETT 12/28 > 12/29    Continuous Infusions:   Objective: Vitals:   12/09/16 0056 12/09/16 0500 12/09/16 0614 12/09/16 0824  BP:  139/75  131/81   Pulse: 93     Resp: 17     Temp:      TempSrc:      SpO2: 92%   97%  Weight:  73.7 kg (162 lb 8 oz)      Intake/Output Summary (Last 24 hours) at 12/09/16 [id number] Last data filed at 12/09/16 0600  Gross per 24 hour  Intake              100 ml  Output              350 ml  Net             -250 ml   Filed Weights   12/07/16 0600 12/08/16 0600 12/09/16 0500  Weight: 75.6 kg (166 lb 10.7 oz) 74.4 kg (164 lb 0.4 oz) 73.7 kg (162 lb 8 oz)    Examination:  General: A/O 4, positive acute respiratory distress, cachectic Eyes: negative  scleral hemorrhage, negative anisocoria, negative icterus ENT: Negative Runny nose, negative gingival bleeding, Neck:  Negative scars, masses, torticollis, lymphadenopathy, JVD Lungs: tachypnea, diffuse poor air movement, diffuse expiratory wheeze, negative crackles  Cardiovascular: Tachycardic, Regular rhythm without murmur gallop or rub normal S1 and S2 Abdomen: negative abdominal pain, nondistended, positive soft, bowel sounds, no rebound, no ascites, no appreciable mass Extremities: No significant cyanosis, clubbing, or edema bilateral lower extremities Skin: Negative rashes, lesions, ulcers Psychiatric:  Negative depression, negative anxiety, negative fatigue, negative mania  Central nervous system:  Cranial nerves II through XII intact, tongue/uvula midline, all extremities muscle strength 5/5, sensation intact throughout, negative dysarthria, negative expressive aphasia, negative receptive aphasia.  .     Data Reviewed: Care during the described time interval was provided by me .  I have reviewed this patient's available data, including medical history, events of note, physical examination, and all test results as part of my evaluation. I have personally reviewed and interpreted all radiology studies.  CBC:  Recent Labs Lab 12/05/16 1945 12/06/16 0745 12/07/16 0401 12/09/16 0300  WBC 28.8* 12.6* 10.7* 7.8  NEUTROABS 23.4*  --   --  6.9  HGB 13.1 9.9* 10.0* 10.5*  HCT 41.6 30.6* 31.2* 32.3*  MCV 92.2 87.7 89.7 88.7  PLT 383 216 197 [id number]   Basic Metabolic Panel:  Recent Labs Lab 12/05/16 1945 12/06/16 0745 12/07/16 0401 12/08/16 0253 12/09/16 0300  NA 138 136 138 139 141  K 4.3 3.5 4.0 3.8 4.5  CL 100* 107 109 108 107  CO2 25 23 24 26 25   GLUCOSE 127* 164* 118* 117* 117*  BUN 25* 25* 27* 37* 43*  CREATININE 1.22 1.13 1.01 1.11 1.24  CALCIUM 9.3 8.3* 8.7* 8.7* 8.8*  MG  --  2.3  --   --  2.4  PHOS  --  2.0*  --   --   --    GFR: Estimated Creatinine Clearance:  53.8 mL/min (by C-G formula based on SCr of 1.24 mg/dL). Liver Function Tests:  Recent Labs Lab 12/05/16 1945  AST 40  ALT 20  ALKPHOS 85  BILITOT 1.0  PROT 6.9  ALBUMIN 3.5   No results for input(s): LIPASE, AMYLASE in the last 168 hours. No results for input(s): AMMONIA in the last 168 hours. Coagulation Profile: No results for input(s): INR, PROTIME in the last 168 hours. Cardiac Enzymes:  Recent Labs Lab 12/05/16 1945 12/06/16 0048 12/06/16 0745 12/09/16 0300  TROPONINI 0.08* 0.09* 0.06* 0.04*   BNP (last 3 results) No results  for input(s): PROBNP in the last 8760 hours. HbA1C: No results for input(s): HGBA1C in the last 72 hours. CBG:  Recent Labs Lab 12/06/16 0854 12/06/16 1136 12/06/16 1631  GLUCAP 153* 131* 130*   Lipid Profile: No results for input(s): CHOL, HDL, LDLCALC, TRIG, CHOLHDL, LDLDIRECT in the last 72 hours. Thyroid Function Tests: No results for input(s): TSH, T4TOTAL, FREET4, T3FREE, THYROIDAB in the last 72 hours. Anemia Panel: No results for input(s): VITAMINB12, FOLATE, FERRITIN, TIBC, IRON, RETICCTPCT in the last 72 hours. Urine analysis:    Component Value Date/Time   COLORURINE YELLOW 12/05/2016 2012   APPEARANCEUR CLEAR 12/05/2016 2012   LABSPEC 1.028 12/05/2016 2012   PHURINE 5.0 12/05/2016 2012   GLUCOSEU NEGATIVE 12/05/2016 2012   HGBUR SMALL (A) 12/05/2016 2012   [place] NEGATIVE 12/05/2016 2012   KETONESUR 20 (A) 12/05/2016 2012   PROTEINUR 100 (A) 12/05/2016 2012   NITRITE NEGATIVE 12/05/2016 2012   LEUKOCYTESUR NEGATIVE 12/05/2016 2012   Sepsis Labs: @LABRCNTIP (procalcitonin:4,lacticidven:4)  ) Recent Results (from the past 240 hour(s))  Urine culture     Status: None   Collection Time: 12/05/16  8:12 PM  Result Value Ref Range Status   Specimen Description URINE, CATHETERIZED  Final   Special Requests NONE  Final   Culture NO GROWTH  Final   Report Status 12/07/2016 FINAL  Final  Blood Culture (routine x  2)     Status: None (Preliminary result)   Collection Time: 12/05/16  8:27 PM  Result Value Ref Range Status   Specimen Description BLOOD RIGHT HAND  Final   Special Requests IN PEDIATRIC BOTTLE 2.5CC  Final   Culture NO GROWTH 3 DAYS  Final   Report Status PENDING  Incomplete  Blood Culture (routine x 2)     Status: None (Preliminary result)   Collection Time: 12/05/16  8:35 PM  Result Value Ref Range Status   Specimen Description BLOOD LEFT HAND  Final   Special Requests BOTTLES DRAWN AEROBIC AND ANAEROBIC 5CC  Final   Culture NO GROWTH 3 DAYS  Final   Report Status PENDING  Incomplete  MRSA PCR Screening     Status: None   Collection Time: 12/06/16  1:03 AM  Result Value Ref Range Status   MRSA by PCR NEGATIVE NEGATIVE Final    Comment:        The GeneXpert MRSA Assay (FDA approved for NASAL specimens only), is one component of a comprehensive MRSA colonization surveillance program. It is not intended to diagnose MRSA infection nor to guide or monitor treatment for MRSA infections.          Radiology Studies: No results found.      Scheduled Meds: . arformoterol  15 mcg Nebulization BID  . aspirin  81 mg Oral Daily  . azithromycin  500 mg Oral QHS  . budesonide (PULMICORT) nebulizer solution  0.5 mg Nebulization BID  . cefTRIAXone (ROCEPHIN)  IV  1 g Intravenous Q24H  . dextromethorphan-guaiFENesin  1 tablet Oral BID  . diltiazem  30 mg Oral Q6H  . heparin  5,000 Units Subcutaneous Q8H  . ipratropium-albuterol  3 mL Nebulization QID  . oseltamivir  75 mg Oral BID  . pravastatin  40 mg Oral q1800  . [START ON 12/10/2016] predniSONE  50 mg Oral Q breakfast  . sodium chloride flush  3 mL Intravenous Q12H   Continuous Infusions:   LOS: 4 days    Time spent: 40 minutes    Reyne Dumas, MD [facility]  If 7PM-7AM, please contact night-coverage [email or web address] Password [email or web address] 12/09/2016, 9:06 AM

## 2016-12-09 NOTE — Progress Notes (Signed)
Pt HR increased to 140-150s while walking to the bathroom. O2 sats dropped in the low 80s. BP 170/114.  Pt back in bed HR now in 90s. O2 sats 97% on 4L O2, and BP 139/75. Will continue to monitor.

## 2016-12-10 ENCOUNTER — Inpatient Hospital Stay (HOSPITAL_COMMUNITY): Payer: Commercial Managed Care - HMO

## 2016-12-10 DIAGNOSIS — R0600 Dyspnea, unspecified: Secondary | ICD-10-CM

## 2016-12-10 LAB — COMPREHENSIVE METABOLIC PANEL
ALT: 30 U/L (ref 17–63)
AST: 35 U/L (ref 15–41)
Albumin: 2.6 g/dL — ABNORMAL LOW (ref 3.5–5.0)
Alkaline Phosphatase: 53 U/L (ref 38–126)
Anion gap: 5 (ref 5–15)
BILIRUBIN TOTAL: 0.2 mg/dL — AB (ref 0.3–1.2)
BUN: 35 mg/dL — ABNORMAL HIGH (ref 6–20)
CHLORIDE: 107 mmol/L (ref 101–111)
CO2: 28 mmol/L (ref 22–32)
CREATININE: 1.18 mg/dL (ref 0.61–1.24)
Calcium: 8.9 mg/dL (ref 8.9–10.3)
GFR, EST NON AFRICAN AMERICAN: 60 mL/min — AB (ref >60)
Glucose, Bld: 110 mg/dL — ABNORMAL HIGH (ref 65–99)
POTASSIUM: 4.5 mmol/L (ref 3.5–5.1)
Sodium: 140 mmol/L (ref 135–145)
TOTAL PROTEIN: 5.4 g/dL — AB (ref 6.5–8.1)

## 2016-12-10 LAB — CBC WITH DIFFERENTIAL/PLATELET
BASOS PCT: 0 %
Basophils Absolute: 0 10*3/uL (ref 0.0–0.1)
Eosinophils Absolute: 0 10*3/uL (ref 0.0–0.7)
Eosinophils Relative: 0 %
HCT: 32.9 % — ABNORMAL LOW (ref 39.0–52.0)
Hemoglobin: 10.5 g/dL — ABNORMAL LOW (ref 13.0–17.0)
LYMPHS ABS: 1.3 10*3/uL (ref 0.7–4.0)
Lymphocytes Relative: 12 %
MCH: 28.5 pg (ref 26.0–34.0)
MCHC: 31.9 g/dL (ref 30.0–36.0)
MCV: 89.2 fL (ref 78.0–100.0)
MONO ABS: 0.5 10*3/uL (ref 0.1–1.0)
Monocytes Relative: 5 %
NEUTROS ABS: 8.8 10*3/uL — AB (ref 1.7–7.7)
Neutrophils Relative %: 83 %
PLATELETS: 212 10*3/uL (ref 150–400)
RBC: 3.69 MIL/uL — ABNORMAL LOW (ref 4.22–5.81)
RDW: 14 % (ref 11.5–15.5)
WBC: 10.6 10*3/uL — AB (ref 4.0–10.5)

## 2016-12-10 LAB — ECHOCARDIOGRAM COMPLETE
CHL CUP MV DEC (S): 155
EERAT: 5.98
EWDT: 155 ms
FS: 26 % — AB (ref 28–44)
Height: 69 in
IVS/LV PW RATIO, ED: 1.08
LA diam end sys: 27 mm
LADIAMINDEX: 1.43 cm/m2
LASIZE: 27 mm
LAVOLA4C: 19.4 mL
LV PW d: 12.3 mm — AB (ref 0.6–1.1)
LVEEAVG: 5.98
LVEEMED: 5.98
LVELAT: 12.4 cm/s
LVOT VTI: 19.7 cm
LVOT area: 5.31 cm2
LVOT diameter: 26 mm
LVOT peak vel: 106 cm/s
LVOTSV: 105 mL
Lateral S' vel: 18.6 cm/s
MV Peak grad: 2 mmHg
MV pk A vel: 99.9 m/s
MVPKEVEL: 74.2 m/s
RV TAPSE: 27.5 mm
TDI e' lateral: 12.4
TDI e' medial: 8.38
Weight: 2600 oz

## 2016-12-10 LAB — MAGNESIUM: Magnesium: 2.3 mg/dL (ref 1.7–2.4)

## 2016-12-10 LAB — CULTURE, BLOOD (ROUTINE X 2)
CULTURE: NO GROWTH
Culture: NO GROWTH

## 2016-12-10 LAB — GASTROINTESTINAL PANEL BY PCR, STOOL (REPLACES STOOL CULTURE)
ADENOVIRUS F40/41: NOT DETECTED
Astrovirus: NOT DETECTED
CRYPTOSPORIDIUM: NOT DETECTED
Campylobacter species: NOT DETECTED
Cyclospora cayetanensis: NOT DETECTED
ENTEROAGGREGATIVE E COLI (EAEC): NOT DETECTED
ENTEROPATHOGENIC E COLI (EPEC): NOT DETECTED
Entamoeba histolytica: NOT DETECTED
Enterotoxigenic E coli (ETEC): NOT DETECTED
GIARDIA LAMBLIA: NOT DETECTED
Norovirus GI/GII: NOT DETECTED
Plesimonas shigelloides: NOT DETECTED
ROTAVIRUS A: NOT DETECTED
Salmonella species: NOT DETECTED
Sapovirus (I, II, IV, and V): NOT DETECTED
Shiga like toxin producing E coli (STEC): NOT DETECTED
Shigella/Enteroinvasive E coli (EIEC): NOT DETECTED
VIBRIO CHOLERAE: NOT DETECTED
VIBRIO SPECIES: NOT DETECTED
YERSINIA ENTEROCOLITICA: NOT DETECTED

## 2016-12-10 MED ORDER — IPRATROPIUM-ALBUTEROL 0.5-2.5 (3) MG/3ML IN SOLN
3.0000 mL | Freq: Three times a day (TID) | RESPIRATORY_TRACT | Status: DC
  Administered 2016-12-10 – 2016-12-11 (×4): 3 mL via RESPIRATORY_TRACT
  Filled 2016-12-10 (×4): qty 3

## 2016-12-10 MED ORDER — IPRATROPIUM-ALBUTEROL 0.5-2.5 (3) MG/3ML IN SOLN: 3.0000 mL | RESPIRATORY_TRACT | Status: DC | PRN

## 2016-12-10 NOTE — Progress Notes (Signed)
PT Cancellation Note  Patient Details Name: NERIAH SCHACHER MRN: [id number] DOB: December 07, 1946   Cancelled Treatment:    Reason Eval/Treat Not Completed: Patient declined, no reason specified Pt refuses working with PT today, "she about wore me out," "I am not getting up today." Educated on importance of mobility and getting OOB. Will follow up next available time.   Marguarite Arbour A Jese Comella 12/10/2016, 11:48 AM Wray Kearns, PT, DPT [phone]

## 2016-12-10 NOTE — Progress Notes (Signed)
The client refused to ambulate today. I educated him on the importance of walking so we could see how his oxygen levels were while ambulating. His nasal canula is now down to 1L, unable to take off completely due to stats falling in the 80's (please see flow sheet).   Saddie Benders RN

## 2016-12-10 NOTE — Progress Notes (Signed)
PROGRESS NOTE    William Villegas  [id number] DOB: January 06, 1946 DOA: 12/05/2016 PCP: Odette Fraction, MD   Brief Narrative:  71 y.o. WM PMHx COPD. HTN   He presented to [facility] ED 12/28 with worsening SOB.  Symptoms started a 2 days prior and were associated with productive cough (green sputum) and subjective fevers. Pt's son had been sick for the few days prior to that, but otherwise, no exposures to known sick contacts. On arrival to ED, pt was minimally responsive and required intubation for respiratory distress and for airway protection.  PCCM was asked to admit.   Subjective:  Patient still requiring 2 L of oxygen, no diarrhea today  Assessment & Plan:   Active Problems:   Acute respiratory failure with hypoxia (HCC)   Influenza due to identified novel influenza A virus with other respiratory manifestations   Respiratory distress   Essential hypertension   Acute encephalopathy   Community acquired pneumonia of right lower lobe of lung ([place])   Acute respiratory failure with hypoxia  -Complete 5 day course of antibiotics, started 12/28 -Flutter valve -Initially received IV Solu-Medrol, changed  to PO prednisone -DuoNeb QID -Brovana neb BID -Pulmicort neb BID -Mucinex DM -Change brovana/pulmicort> change to anoro at discahrge CT PE study no evidence of PE, COPD/5 accessory, suspected bronchopneumonia Echo to rule out CHF  COPD exacerbation/CAP Influenza A Positive On Tamiflu  Troponin leak  - suspect demand continue to trend  HTN -Tele monitoring -Cardizem 30 mg  QID -Lisinopril 10 mg daily  Acute encephalopathy -> resolved  Deconditioning -PT; recommends 24-hour home supervision.  OT consult pending  2.2 cm mass posterior segment of right liver lobe Will need outpatient MRI for further evaluation  Diarrhea-negative for C. difficile Had 1 bowel movement yesterday   DVT prophylaxis: SCD's / heparin  Code Status: Full Family Communication:  None Disposition Plan:  Likely tomorrow as the patient still needing 3 L of oxygen   Consultants:  [facility] M  Procedures/Significant Events:  CXR 12/28 > no acute process. CXR 12/30> increasing R base atelectasis vs infiltrate   VENTILATOR SETTINGS:    Cultures Blood 12/28 >> Sputum 12/28 >> Flu 12/28 >> Influenza A positive  Antimicrobials: Anti-infectives    Start     Stop   12/06/16 1000  vancomycin (VANCOCIN) IVPB 750 mg/150 ml premix  Status:  Discontinued     12/05/16 2206   12/06/16 0230  piperacillin-tazobactam (ZOSYN) IVPB 3.375 g  Status:  Discontinued     12/05/16 2206   12/06/16 0145  oseltamivir (TAMIFLU) 6 MG/ML suspension 75 mg     12/10/16 2159   12/05/16 2230  oseltamivir (TAMIFLU) capsule 75 mg  Status:  Discontinued     12/06/16 0123   12/05/16 2230  cefTRIAXone (ROCEPHIN) 1 g in dextrose 5 % 50 mL IVPB         12/05/16 2230  azithromycin (ZITHROMAX) 500 mg in dextrose 5 % 250 mL IVPB         12/05/16 2030  vancomycin (VANCOCIN) 1,500 mg in sodium chloride 0.9 % 500 mL IVPB     12/05/16 2256   12/05/16 2015  piperacillin-tazobactam (ZOSYN) IVPB 3.375 g     12/05/16 2109   12/05/16 2015  vancomycin (VANCOCIN) IVPB 1000 mg/200 mL premix  Status:  Discontinued     12/05/16 2016       Devices    LINES / TUBES:  ETT 12/28 > 12/29    Continuous Infusions:   Objective:  Vitals:   12/09/16 2107 12/09/16 2145 12/09/16 2200 12/10/16 0637  BP:  124/69  (!) 145/85  Pulse:  (!) 103 (!) 110 (!) 102  Resp:  16 15 19   Temp:  98.6 F (37 C)  98.4 F (36.9 C)  TempSrc:  Oral  Oral  SpO2: 97% 96% 95% 94%  Weight:    73.7 kg (162 lb 8 oz)  Height:    5\' 9"  (1.753 m)    Intake/Output Summary (Last 24 hours) at 12/10/16 0842 Last data filed at 12/10/16 0841  Gross per 24 hour  Intake           893.33 ml  Output              650 ml  Net           243.33 ml   Filed Weights   12/08/16 0600 12/09/16 0500 12/10/16 [id number]  Weight: 74.4 kg (164 lb 0.4  oz) 73.7 kg (162 lb 8 oz) 73.7 kg (162 lb 8 oz)    Examination:  General: A/O 4, positive acute respiratory distress, cachectic Eyes: negative scleral hemorrhage, negative anisocoria, negative icterus ENT: Negative Runny nose, negative gingival bleeding, Neck:  Negative scars, masses, torticollis, lymphadenopathy, JVD Lungs: tachypnea, diffuse poor air movement, diffuse expiratory wheeze, negative crackles  Cardiovascular: Tachycardic, Regular rhythm without murmur gallop or rub normal S1 and S2 Abdomen: negative abdominal pain, nondistended, positive soft, bowel sounds, no rebound, no ascites, no appreciable mass Extremities: No significant cyanosis, clubbing, or edema bilateral lower extremities Skin: Negative rashes, lesions, ulcers Psychiatric:  Negative depression, negative anxiety, negative fatigue, negative mania  Central nervous system:  Cranial nerves II through XII intact, tongue/uvula midline, all extremities muscle strength 5/5, sensation intact throughout, negative dysarthria, negative expressive aphasia, negative receptive aphasia.  .     Data Reviewed: Care during the described time interval was provided by me .  I have reviewed this patient's available data, including medical history, events of note, physical examination, and all test results as part of my evaluation. I have personally reviewed and interpreted all radiology studies.  CBC:  Recent Labs Lab 12/05/16 1945 12/06/16 0745 12/07/16 0401 12/09/16 0300  WBC 28.8* 12.6* 10.7* 7.8  NEUTROABS 23.4*  --   --  6.9  HGB 13.1 9.9* 10.0* 10.5*  HCT 41.6 30.6* 31.2* 32.3*  MCV 92.2 87.7 89.7 88.7  PLT 383 216 197 [id number]   Basic Metabolic Panel:  Recent Labs Lab 12/05/16 1945 12/06/16 0745 12/07/16 0401 12/08/16 0253 12/09/16 0300  NA 138 136 138 139 141  K 4.3 3.5 4.0 3.8 4.5  CL 100* 107 109 108 107  CO2 25 23 24 26 25   GLUCOSE 127* 164* 118* 117* 117*  BUN 25* 25* 27* 37* 43*  CREATININE 1.22 1.13  1.01 1.11 1.24  CALCIUM 9.3 8.3* 8.7* 8.7* 8.8*  MG  --  2.3  --   --  2.4  PHOS  --  2.0*  --   --   --    GFR: Estimated Creatinine Clearance: 53.8 mL/min (by C-G formula based on SCr of 1.24 mg/dL). Liver Function Tests:  Recent Labs Lab 12/05/16 1945  AST 40  ALT 20  ALKPHOS 85  BILITOT 1.0  PROT 6.9  ALBUMIN 3.5   No results for input(s): LIPASE, AMYLASE in the last 168 hours. No results for input(s): AMMONIA in the last 168 hours. Coagulation Profile: No results for input(s): INR, PROTIME in the last 168 hours. Cardiac  Enzymes:  Recent Labs Lab 12/05/16 1945 12/06/16 0048 12/06/16 0745 12/09/16 0300  TROPONINI 0.08* 0.09* 0.06* 0.04*   BNP (last 3 results) No results for input(s): PROBNP in the last 8760 hours. HbA1C: No results for input(s): HGBA1C in the last 72 hours. CBG:  Recent Labs Lab 12/06/16 0854 12/06/16 1136 12/06/16 1631  GLUCAP 153* 131* 130*   Lipid Profile: No results for input(s): CHOL, HDL, LDLCALC, TRIG, CHOLHDL, LDLDIRECT in the last 72 hours. Thyroid Function Tests: No results for input(s): TSH, T4TOTAL, FREET4, T3FREE, THYROIDAB in the last 72 hours. Anemia Panel: No results for input(s): VITAMINB12, FOLATE, FERRITIN, TIBC, IRON, RETICCTPCT in the last 72 hours. Urine analysis:    Component Value Date/Time   COLORURINE YELLOW 12/05/2016 2012   APPEARANCEUR CLEAR 12/05/2016 2012   LABSPEC 1.028 12/05/2016 2012   PHURINE 5.0 12/05/2016 2012   GLUCOSEU NEGATIVE 12/05/2016 2012   HGBUR SMALL (A) 12/05/2016 2012   [place] NEGATIVE 12/05/2016 2012   KETONESUR 20 (A) 12/05/2016 2012   PROTEINUR 100 (A) 12/05/2016 2012   NITRITE NEGATIVE 12/05/2016 2012   LEUKOCYTESUR NEGATIVE 12/05/2016 2012   Sepsis Labs: @LABRCNTIP (procalcitonin:4,lacticidven:4)  ) Recent Results (from the past 240 hour(s))  Urine culture     Status: None   Collection Time: 12/05/16  8:12 PM  Result Value Ref Range Status   Specimen Description  URINE, CATHETERIZED  Final   Special Requests NONE  Final   Culture NO GROWTH  Final   Report Status 12/07/2016 FINAL  Final  Blood Culture (routine x 2)     Status: None (Preliminary result)   Collection Time: 12/05/16  8:27 PM  Result Value Ref Range Status   Specimen Description BLOOD RIGHT HAND  Final   Special Requests IN PEDIATRIC BOTTLE 2.5CC  Final   Culture NO GROWTH 4 DAYS  Final   Report Status PENDING  Incomplete  Blood Culture (routine x 2)     Status: None (Preliminary result)   Collection Time: 12/05/16  8:35 PM  Result Value Ref Range Status   Specimen Description BLOOD LEFT HAND  Final   Special Requests BOTTLES DRAWN AEROBIC AND ANAEROBIC 5CC  Final   Culture NO GROWTH 4 DAYS  Final   Report Status PENDING  Incomplete  MRSA PCR Screening     Status: None   Collection Time: 12/06/16  1:03 AM  Result Value Ref Range Status   MRSA by PCR NEGATIVE NEGATIVE Final    Comment:        The GeneXpert MRSA Assay (FDA approved for NASAL specimens only), is one component of a comprehensive MRSA colonization surveillance program. It is not intended to diagnose MRSA infection nor to guide or monitor treatment for MRSA infections.   C difficile quick scan w PCR reflex     Status: None   Collection Time: 12/09/16  3:11 PM  Result Value Ref Range Status   C Diff antigen NEGATIVE NEGATIVE Final   C Diff toxin NEGATIVE NEGATIVE Final   C Diff interpretation No C. difficile detected.  Final         Radiology Studies: Ct Angio Chest Pe W Or Wo Contrast  Result Date: 12/09/2016 CLINICAL DATA:  Pt admitted with the flu on Thursday; hx of COPD; pt denies worsened SOB; pt c/o intermittent LEFT lower chest pain. EXAM: CT ANGIOGRAPHY CHEST WITH CONTRAST TECHNIQUE: Multidetector CT imaging of the chest was performed using the standard protocol during bolus administration of intravenous contrast. Multiplanar CT image reconstructions and  MIPs were obtained to evaluate the vascular  anatomy. CONTRAST:  100 mL of Isovue 370 intravenous contrast COMPARISON:  Chest radiograph 12/07/2016 FINDINGS: Cardiovascular: Satisfactory opacification of the pulmonary arteries to the segmental level. No evidence of pulmonary embolism. Normal heart size. No pericardial effusion. Moderate coronary artery calcifications. The great vessels are normal in caliber. No aortic dissection. Mild atherosclerotic calcifications are noted along the thoracic aorta and arch branch vessels. Mediastinum/Nodes: No enlarged mediastinal, hilar, or axillary lymph nodes. Thyroid gland, trachea, and esophagus demonstrate no significant findings. Lungs/Pleura: There is hyperexpansion of the left lower lobe displacing the oblique fissure anteriorly leading to mild compressive atelectasis along the posterior aspect of the left upper lobe. There is wall thickening of the left lower lobe segmental bronchi, likely combination of mucus and inflammation. In the right lower lobe, bronchial wall thickening is also evident with mucous plugging opacifying the posterior basilar segmental bronchus. There is associated peribronchovascular opacity that is most consistent with atelectasis. A component of pneumonia is possible. Depression of the oblique fissure posteriorly supports atelectasis. Milder bronchial wall thickening is seen in the right upper lobe. There are mild changes of emphysema. No pulmonary edema.  No pleural effusion or pneumothorax. Upper Abdomen: There is a vague low-attenuation 2.2 cm lesion in the posterior segment of the right liver lobe. No other visualize liver lesions. No acute findings in the upper abdomen. Musculoskeletal: No chest wall abnormality. No acute or significant osseous findings. Review of the MIP images confirms the above findings. IMPRESSION: 1. No evidence of a pulmonary embolism. 2. COPD/mild emphysema. 3. Hyperexpansion of left lower lobe which appears due to airways disease and air trapping. 4. Bronchial  wall thickening and mucous plugging in the right lower lobe with associated peribronchovascular opacity, the latter most consistent with atelectasis. Consider a component of bronchopneumonia if there are consistent clinical symptoms. This leads to right lower lobe volume. 5. No other evidence of acute abnormality. 6. Nonspecific 2.2 cm relative hyperattenuating lesion at the dome of the liver, posterior segment of the right lobe. Recommend followup assessment initially with ultrasound. If this does not visualized or characterized lesion, liver MRI with and without contrast would be recommended. Electronically Signed   By: Lajean Manes M.D.   On: 12/09/2016 10:37        Scheduled Meds: . arformoterol  15 mcg Nebulization BID  . aspirin  81 mg Oral Daily  . azithromycin  500 mg Oral QHS  . budesonide (PULMICORT) nebulizer solution  0.5 mg Nebulization BID  . cefTRIAXone (ROCEPHIN)  IV  1 g Intravenous Q24H  . dextromethorphan-guaiFENesin  1 tablet Oral BID  . diltiazem  30 mg Oral Q6H  . heparin  5,000 Units Subcutaneous Q8H  . ipratropium-albuterol  3 mL Nebulization QID  . oseltamivir  75 mg Oral BID  . pravastatin  40 mg Oral q1800  . predniSONE  50 mg Oral Q breakfast  . sodium chloride flush  3 mL Intravenous Q12H   Continuous Infusions:   LOS: 5 days    Time spent: 40 minutes    Caster Fayette, MD [facility]    If 7PM-7AM, please contact night-coverage [email or web address] Password [facility] 12/10/2016, 8:42 AM

## 2016-12-11 MED ORDER — HYDROCODONE-HOMATROPINE 5-1.5 MG/5ML PO SYRP
5.0000 mL | ORAL_SOLUTION | Freq: Four times a day (QID) | ORAL | 0 refills | Status: DC | PRN
Start: 2016-12-11 — End: 2017-10-10

## 2016-12-11 MED ORDER — AZITHROMYCIN 500 MG PO TABS: 500.0000 mg | ORAL_TABLET | Freq: Every day | ORAL | 0 refills | Status: AC

## 2016-12-11 MED ORDER — DILTIAZEM HCL ER COATED BEADS 180 MG PO CP24: 180.0000 mg | ORAL_CAPSULE | Freq: Every day | ORAL | 2 refills | Status: DC

## 2016-12-11 MED ORDER — UMECLIDINIUM-VILANTEROL 62.5-25 MCG/INH IN AEPB: 1.0000 | INHALATION_SPRAY | Freq: Every day | RESPIRATORY_TRACT | 1 refills | Status: DC

## 2016-12-11 MED ORDER — BISOPROLOL FUMARATE 5 MG PO TABS
5.0000 mg | ORAL_TABLET | Freq: Every day | ORAL | Status: DC
  Administered 2016-12-11: 5 mg via ORAL
  Filled 2016-12-11: qty 1

## 2016-12-11 MED ORDER — PREDNISONE 50 MG PO TABS: 50.0000 mg | ORAL_TABLET | Freq: Every day | ORAL | 0 refills | Status: AC

## 2016-12-11 MED ORDER — ARFORMOTEROL TARTRATE 15 MCG/2ML IN NEBU: 15.0000 ug | INHALATION_SOLUTION | Freq: Two times a day (BID) | RESPIRATORY_TRACT | 1 refills | Status: DC

## 2016-12-11 MED ORDER — DM-GUAIFENESIN ER 30-600 MG PO TB12: 1.0000 | ORAL_TABLET | Freq: Two times a day (BID) | ORAL | 0 refills | Status: AC

## 2016-12-11 MED ORDER — BISOPROLOL FUMARATE 5 MG PO TABS: 5.0000 mg | ORAL_TABLET | Freq: Every day | ORAL | 0 refills | Status: DC

## 2016-12-11 MED ORDER — CEFDINIR 300 MG PO CAPS: 300.0000 mg | ORAL_CAPSULE | Freq: Two times a day (BID) | ORAL | 0 refills | Status: AC

## 2016-12-11 NOTE — Progress Notes (Signed)
Physical Therapy Treatment Patient Details Name: William Villegas MRN: [id number] DOB: 1945/12/20 Today's Date: 12/11/2016    History of Present Illness William Villegas is a 71 y.o. male with PMH as outlined below including but not limited to COPD. He presented to [facility] ED 12/28 with worsening SOB.  Symptoms started a 2 days prior and were associated with productive cough (green sputum) and subjective fevers. Pt + for flu.    PT Comments    Patient progressing slowly towards PT goals. Pt's Sp02 87% on RA despite cues for pursed lip breathing. Sp02 ranged from 89-93% on 2L/min 02 during ambulation. Pt with impaired cardiovascular endurance requiring frequent standing rest breaks due to fatigue and SOB. Pt will have 24/7 supervision at home. Education re: performing short bouts of activity with longer rest breaks, mobility progression and exercise. Pt will need supplemental 02. Will continue to follow.   Follow Up Recommendations  Home health PT;Supervision/Assistance - 24 hour     Equipment Recommendations  Rolling walker with 5" wheels    Recommendations for Other Services       Precautions / Restrictions Precautions Precautions: Fall Precaution Comments: SOB, watch SpO2 Restrictions Weight Bearing Restrictions: No    Mobility  Bed Mobility Overal bed mobility: Needs Assistance Bed Mobility: Supine to Sit              Transfers Overall transfer level: Needs assistance Equipment used: Rolling walker (2 wheeled) Transfers: Sit to/from Stand Sit to Stand: Min guard         General transfer comment: v/c's for hand placement. Stood from [employer], BSC x1.   Ambulation/Gait Ambulation/Gait assistance: Min assist Ambulation Distance (Feet): 100 Feet Assistive device: Rolling walker (2 wheeled) Gait Pattern/deviations: Step-through pattern;Decreased stride length Gait velocity: slow Gait velocity interpretation: Below normal speed for age/gender General Gait Details: 2/4  DOE. Sp02 ranged from 89-91% on 2L/min 02. 2 standing rest breaks. HR stable.   Stairs            Wheelchair Mobility    Modified Rankin (Stroke Patients Only)       Balance Overall balance assessment: Needs assistance Sitting-balance support: Feet supported;No upper extremity supported Sitting balance-Leahy Scale: Fair     Standing balance support: During functional activity Standing balance-Leahy Scale: Fair Standing balance comment: benefits from RW to steady self,. Able to perform pericare with UE support.                     Cognition Arousal/Alertness: Awake/alert Behavior During Therapy: WFL for tasks assessed/performed Overall Cognitive Status: Within Functional Limits for tasks assessed                      Exercises      General Comments General comments (skin integrity, edema, etc.): Sp02 at rest 87% on RA. Ranged from 89-93% on 2L/min 02.      Pertinent Vitals/Pain Pain Assessment: No/denies pain    Home Living                      Prior Function            PT Goals (current goals can now be found in the care plan section) Progress towards PT goals: Progressing toward goals    Frequency    Min 3X/week      PT Plan Current plan remains appropriate    Co-evaluation  End of Session Equipment Utilized During Treatment: Gait belt;Oxygen Activity Tolerance: Treatment limited secondary to medical complications (Comment) (drop in Sp02.) Patient left: in bed;with call bell/phone within reach (sitting EOB.)     Time: [id number] PT Time Calculation (min) (ACUTE ONLY): 32 min  Charges:  $Gait Training: 8-22 mins $Therapeutic Activity: 8-22 mins                    G Codes:      Haileigh Pitz A Kynisha Memon 12/11/2016, 2:26 PM Wray [place], [place], DPT [phone]

## 2016-12-11 NOTE — Discharge Instructions (Signed)
Acute Respiratory Failure, Adult Acute respiratory failure occurs when there is not enough oxygen passing from your lungs to your body. When this happens, your lungs have trouble removing carbon dioxide from the blood. This causes your blood oxygen level to drop too low as carbon dioxide builds up. Acute respiratory failure is a medical emergency. It can develop quickly, but it is temporary if treated promptly. Your lung capacity, or how much air your lungs can hold, may improve with time, exercise, and treatment. What are the causes? There are many possible causes of acute respiratory failure, including:  Lung injury.  Chest injury or damage to the ribs or tissues near the lungs.  Lung conditions that affect the flow of air and blood into and out of the lungs, such as pneumonia, acute respiratory distress syndrome, and cystic fibrosis.  Medical conditions, such as strokes or spinal cord injuries, that affect the muscles and nerves that control breathing.  Blood infection (sepsis).  Inflammation of the pancreas (pancreatitis).  A blood clot in the lungs (pulmonary embolism).  A large-volume blood transfusion.  Burns.  Near-drowning.  Seizure.  Smoke inhalation.  Reaction to medicines.  Alcohol or drug overdose. What increases the risk? This condition is more likely to develop in people who have:  A blocked airway.  Asthma.  A condition or disease that damages or weakens the muscles, nerves, bones, or tissues that are involved in breathing.  A serious infection.  A health problem that blocks the unconscious reflex that is involved in breathing, such as hypothyroidism or sleep apnea.  A lung injury or trauma. What are the signs or symptoms? Trouble breathing is the main symptom of acute respiratory failure. Symptoms may also include:  Rapid breathing.  Restlessness or anxiety.  Skin, lips, or fingernails that appear blue (cyanosis).  Rapid heart  rate.  Abnormal heart rhythms (arrhythmias).  Confusion or changes in behavior.  Tiredness or loss of energy.  Feeling sleepy or having a loss of consciousness. How is this diagnosed? Your health care provider can diagnose acute respiratory failure with a medical history and physical exam. During the exam, your health care provider will listen to your heart and check for crackling or wheezing sounds in your lungs. Your may also have tests to confirm the diagnosis and determine what is causing respiratory failure. These tests may include:  Measuring the amount of oxygen in your blood (pulse oximetry). The measurement comes from a small device that is placed on your finger, earlobe, or toe.  Other blood tests to measure blood gases and to look for signs of infection.  Sampling your cerebral spinal fluid or tracheal fluid to check for infections.  Chest X-ray to look for fluid in spaces that should be filled with air.  Electrocardiogram (ECG) to look at the heart's electrical activity. How is this treated? Treatment for this condition usually takes places in a hospital intensive care unit (ICU). Treatment depends on what is causing the condition. It may include one or more treatments until your symptoms improve. Treatment may include:  Supplemental oxygen. Extra oxygen is given through a tube in the nose, a face mask, or a hood.  A device such as a continuous positive airway pressure (CPAP) or bi-level positive airway pressure (BiPAP or BPAP) machine. This treatment uses mild air pressure to keep the airways open. A mask or other device will be placed over your nose or mouth. A tube that is connected to a motor will deliver oxygen through the  mask.  Ventilator. This treatment helps move air into and out of the lungs. This may be done with a bag and mask or a machine. For this treatment, a tube is placed in your windpipe (trachea) so air and oxygen can flow to the lungs.  Extracorporeal  membrane oxygenation (ECMO). This treatment temporarily takes over the function of the heart and lungs, supplying oxygen and removing carbon dioxide. ECMO gives the lungs a chance to recover. It may be used if a ventilator is not effective.  Tracheostomy. This is a procedure that creates a hole in the neck to insert a breathing tube.  Receiving fluids and medicines.  Rocking the bed to help breathing. Follow these instructions at home:  Take over-the-counter and prescription medicines only as told by your health care provider.  Return to normal activities as told by your health care provider. Ask your health care provider what activities are safe for you.  Keep all follow-up visits as told by your health care provider. This is important. How is this prevented? Treating infections and medical conditions that may lead to acute respiratory failure can help prevent the condition from developing. Contact a health care provider if:  You have a fever.  Your symptoms do not improve or they get worse. Get help right away if:  You are having trouble breathing.  You lose consciousness.  Your have cyanosis or turn blue.  You develop a rapid heart rate.  You are confused. These symptoms may represent a serious problem that is an emergency. Do not wait to see if the symptoms will go away. Get medical help right away. Call your local emergency services (911 in the [place] Do not drive yourself to the hospital.  This information is not intended to replace advice given to you by your health care provider. Make sure you discuss any questions you have with your health care provider. Document Released: 11/30/2013 Document Revised: 06/22/2016 Document Reviewed: 06/12/2016 Elsevier Interactive Patient Education  2017 [place]. Influenza, Adult Influenza, more commonly known as the flu, is a viral infection that primarily affects the respiratory tract. The respiratory tract includes organs that  help you breathe, such as the lungs, nose, and throat. The flu causes many common cold symptoms, as well as a high fever and body aches. The flu spreads easily from person to person (is contagious). Getting a flu shot (influenza vaccination) every year is the best way to prevent influenza. What are the causes? Influenza is caused by a virus. You can catch the virus by:  Breathing in droplets from an infected person's cough or sneeze.  Touching something that was recently contaminated with the virus and then touching your mouth, nose, or eyes. What increases the risk? The following factors may make you more likely to get the flu:  Not cleaning your hands frequently with soap and water or alcohol-based hand sanitizer.  Having close contact with many people during cold and flu season.  Touching your mouth, eyes, or nose without washing or sanitizing your hands first.  Not drinking enough fluids or not eating a healthy diet.  Not getting enough sleep or exercise.  Being under a high amount of stress.  Not getting a yearly (annual) flu shot. You may be at a higher risk of complications from the flu, such as a severe lung infection (pneumonia), if you:  Are over the age of 53.  Are pregnant.  Have a weakened disease-fighting system (immune system). You may have a weakened immune system  if you:  Have HIV or AIDS.  Are undergoing chemotherapy.  Aretaking medicines that reduce the activity of (suppress) the immune system.  Have a long-term (chronic) illness, such as heart disease, kidney disease, diabetes, or lung disease.  Have a liver disorder.  Are obese.  Have anemia. What are the signs or symptoms? Symptoms of this condition typically last 4-10 days and may include:  Fever.  Chills.  Headache, body aches, or muscle aches.  Sore throat.  Cough.  Runny or congested nose.  Chest discomfort and cough.  Poor appetite.  Weakness or tiredness  (fatigue).  Dizziness.  Nausea or vomiting. How is this diagnosed? This condition may be diagnosed based on your medical history and a physical exam. Your health care provider may do a nose or throat swab test to confirm the diagnosis. How is this treated? If influenza is detected early, you can be treated with antiviral medicine that can reduce the length of your illness and the severity of your symptoms. This medicine may be given by mouth (orally) or through an IV tube that is inserted in one of your veins. The goal of treatment is to relieve symptoms by taking care of yourself at home. This may include taking over-the-counter medicines, drinking plenty of fluids, and adding humidity to the air in your home. In some cases, influenza goes away on its own. Severe influenza or complications from influenza may be treated in a hospital. Follow these instructions at home:  Take over-the-counter and prescription medicines only as told by your health care provider.  Use a cool mist humidifier to add humidity to the air in your home. This can make breathing easier.  Rest as needed.  Drink enough fluid to keep your urine clear or pale yellow.  Cover your mouth and nose when you cough or sneeze.  Wash your hands with soap and water often, especially after you cough or sneeze. If soap and water are not available, use hand sanitizer.  Stay home from work or school as told by your health care provider. Unless you are visiting your health care provider, try to avoid leaving home until your fever has been gone for 24 hours without the use of medicine.  Keep all follow-up visits as told by your health care provider. This is important. How is this prevented?  Getting an annual flu shot is the best way to avoid getting the flu. You may get the flu shot in late summer, fall, or winter. Ask your health care provider when you should get your flu shot.  Wash your hands often or use hand sanitizer  often.  Avoid contact with people who are sick during cold and flu season.  Eat a healthy diet, drink plenty of fluids, get enough sleep, and exercise regularly. Contact a health care provider if:  You develop new symptoms.  You have:  Chest pain.  Diarrhea.  A fever.  Your cough gets worse.  You produce more mucus.  You feel nauseous or you vomit. Get help right away if:  You develop shortness of breath or difficulty breathing.  Your skin or nails turn a bluish color.  You have severe pain or stiffness in your neck.  You develop a sudden headache or sudden pain in your face or ear.  You cannot stop vomiting. This information is not intended to replace advice given to you by your health care provider. Make sure you discuss any questions you have with your health care provider. Document Released: 11/22/2000  Document Revised: 05/02/2016 Document Reviewed: 09/19/2015 Elsevier Interactive Patient Education  2017 [employer].

## 2016-12-11 NOTE — Progress Notes (Signed)
SATURATION QUALIFICATIONS: (This note is used to comply with regulatory documentation for home oxygen)  Patient Saturations on Room Air at Rest = 87%  Patient Saturations on Room Air while Ambulating = 87%  Patient Saturations on 2 Liters of oxygen while Ambulating = 89-93%  Please briefly explain why patient needs home oxygen: Pt not able to maintain oxygen saturations at rest or while ambulating and requires supplemental 02 for home.   Wray Kearns, [place], DPT [phone]

## 2016-12-11 NOTE — Care Management Note (Signed)
Case Management Note  Patient Details  Name: William Villegas MRN: [id number] Date of Birth: May 10, 1946  Subjective/Objective: Pt presented for Acute Respiratory Failure. Pt is from home alone, however pt states that his sister will be at home during the day with him and his son will stay at night. CM did discuss SNF option with patient and he is refusing SNF. PT declined to work with therapy this am and states he can not move. CM did ask him how he will get around at home. Pt states he will have help. CM did offer choice for [facility] services and pt is refusing all services at this time. Pt states Dr needs to get his hear rate right.                    Action/Plan: CM did ask staff RN to call MD in regards to pt having  questions about HR. CM asked Staff RN to ask PT to come and re-evaluate again and assess for 02 needs or RN to do sitting evaluation for 02.  Pt is on board with having DME delivered from [facility]. Referral sent to [facility] in regards to DME and will be delivered to room. No [facility] Services set up at this time due to pt refusing services.   Expected Discharge Date:                  Expected Discharge Plan:  Home/Self Care  In-House Referral:  NA  Discharge planning Services  CM Consult  Post Acute Care Choice:  Durable Medical Equipment Choice offered to:  Patient  DME Arranged:  Nebulizer machine, Walker rolling, Oxygen DME Agency:  [place]:  Patient Refused [facility] Agency:  NA  Status of Service:  Completed, signed off  If discussed at [place] of Stay Meetings, dates discussed:    Additional Comments:  Bethena Roys, RN 12/11/2016, 12:10 PM

## 2016-12-11 NOTE — Discharge Summary (Addendum)
Physician Discharge Summary  William Villegas MRN: [id number] DOB/AGE: Apr 30, 1946 71 y.o.  PCP: William Fraction, MD   Admit date: 12/05/2016 Discharge date: 12/11/2016  Discharge Diagnoses:    Active Problems:   Acute respiratory failure with hypoxia (HCC)   Influenza due to identified novel influenza A virus with other respiratory manifestations   Respiratory distress   Essential hypertension   Acute encephalopathy   Community acquired pneumonia of right lower lobe of lung ([place])    Follow-up recommendations Follow-up with PCP in 3-5 days , including all  additional recommended appointments as below Follow-up CBC, CMP in 3-5 days Patient will be discharged home with nebulizer device and home oxygen Outpatient MRI of the liver to further evaluate 2.2 cm lesion in the liver    Current Discharge Medication List    START taking these medications   Details  azithromycin (ZITHROMAX) 500 MG tablet Take 1 tablet (500 mg total) by mouth daily. Qty: 5 tablet, Refills: 0    bisoprolol (ZEBETA) 5 MG tablet Take 1 tablet (5 mg total) by mouth daily. Qty: 30 tablet, Refills: 0    cefdinir (OMNICEF) 300 MG capsule Take 1 capsule (300 mg total) by mouth 2 (two) times daily. Qty: 14 capsule, Refills: 0    dextromethorphan-guaiFENesin (MUCINEX DM) 30-600 MG 12hr tablet Take 1 tablet by mouth 2 (two) times daily. Qty: 60 tablet, Refills: 0    diltiazem (CARDIZEM CD) 180 MG 24 hr capsule Take 1 capsule (180 mg total) by mouth daily. Qty: 30 capsule, Refills: 2    HYDROcodone-homatropine (HYCODAN) 5-1.5 MG/5ML syrup Take 5 mLs by mouth every 6 (six) hours as needed for cough. Qty: 120 mL, Refills: 0    !! umeclidinium-vilanterol (ANORO ELLIPTA) 62.5-25 MCG/INH AEPB Inhale 1 puff into the lungs daily. Qty: 1 each, Refills: 1     !! - Potential duplicate medications found. Please discuss with provider.    CONTINUE these medications which have CHANGED   Details  predniSONE  (DELTASONE) 50 MG tablet Take 1 tablet (50 mg total) by mouth daily with breakfast. Qty: 5 tablet, Refills: 0      CONTINUE these medications which have NOT CHANGED   Details    Inhale 1 puff into the lungs daily. Qty: 30 each, Refills: 0    albuterol (PROVENTIL HFA;VENTOLIN HFA) 108 (90 BASE) MCG/ACT inhaler Inhale 2 puffs into the lungs every 6 (six) hours as needed for wheezing. Qty: 3 Inhaler, Refills: 1    albuterol (PROVENTIL) (2.5 MG/3ML) 0.083% nebulizer solution Take 3 mLs (2.5 mg total) by nebulization every 6 (six) hours as needed for wheezing or shortness of breath. Qty: 150 mL, Refills: 2   Associated Diagnoses: COPD exacerbation (HCC)    aspirin 81 MG tablet Take 81 mg by mouth daily.    pravastatin (PRAVACHOL) 40 MG tablet TAKE 1 TABLET EVERY DAY Qty: 90 tablet, Refills: 1     !! - Potential duplicate medications found. Please discuss with provider.    STOP taking these medications     lisinopril (PRINIVIL,ZESTRIL) 20 MG tablet        Discharge Condition: Stable Discharge Instructions Get Medicines reviewed and adjusted: Please take all your medications with you for your next visit with your Primary MD  Please request your Primary MD to go over all hospital tests and procedure/radiological results at the follow up, please ask your Primary MD to get all Hospital records sent to his/her office.  If you experience worsening of your admission symptoms, develop  shortness of breath, life threatening emergency, suicidal or homicidal thoughts you must seek medical attention immediately by calling 911 or calling your MD immediately if symptoms less severe.  You must read complete instructions/literature along with all the possible adverse reactions/side effects for all the Medicines you take and that have been prescribed to you. Take any new Medicines after you have completely understood and accpet all the possible adverse reactions/side effects.   Do not drive when  taking Pain medications.   Do not take more than prescribed Pain, Sleep and Anxiety Medications  Special Instructions: If you have smoked or chewed Tobacco in the last 2 yrs please stop smoking, stop any regular Alcohol and or any Recreational drug use.  Wear Seat belts while driving.  Please note  You were cared for by a hospitalist during your hospital stay. Once you are discharged, your primary care physician will handle any further medical issues. Please note that NO REFILLS for any discharge medications will be authorized once you are discharged, as it is imperative that you return to your primary care physician (or establish a relationship with a primary care physician if you do not have one) for your aftercare needs so that they can reassess your need for medications and monitor your lab values.     No Known Allergies    Disposition: 01-Home or Self Care   Consults:  Critical care      Significant Diagnostic Studies:  Ct Angio Chest Pe W Or Wo Contrast  Result Date: 12/09/2016 CLINICAL DATA:  Pt admitted with the flu on Thursday; hx of COPD; pt denies worsened SOB; pt c/o intermittent LEFT lower chest pain. EXAM: CT ANGIOGRAPHY CHEST WITH CONTRAST TECHNIQUE: Multidetector CT imaging of the chest was performed using the standard protocol during bolus administration of intravenous contrast. Multiplanar CT image reconstructions and MIPs were obtained to evaluate the vascular anatomy. CONTRAST:  100 mL of Isovue 370 intravenous contrast COMPARISON:  Chest radiograph 12/07/2016 FINDINGS: Cardiovascular: Satisfactory opacification of the pulmonary arteries to the segmental level. No evidence of pulmonary embolism. Normal heart size. No pericardial effusion. Moderate coronary artery calcifications. The great vessels are normal in caliber. No aortic dissection. Mild atherosclerotic calcifications are noted along the thoracic aorta and arch branch vessels. Mediastinum/Nodes: No  enlarged mediastinal, hilar, or axillary lymph nodes. Thyroid gland, trachea, and esophagus demonstrate no significant findings. Lungs/Pleura: There is hyperexpansion of the left lower lobe displacing the oblique fissure anteriorly leading to mild compressive atelectasis along the posterior aspect of the left upper lobe. There is wall thickening of the left lower lobe segmental bronchi, likely combination of mucus and inflammation. In the right lower lobe, bronchial wall thickening is also evident with mucous plugging opacifying the posterior basilar segmental bronchus. There is associated peribronchovascular opacity that is most consistent with atelectasis. A component of pneumonia is possible. Depression of the oblique fissure posteriorly supports atelectasis. Milder bronchial wall thickening is seen in the right upper lobe. There are mild changes of emphysema. No pulmonary edema.  No pleural effusion or pneumothorax. Upper Abdomen: There is a vague low-attenuation 2.2 cm lesion in the posterior segment of the right liver lobe. No other visualize liver lesions. No acute findings in the upper abdomen. Musculoskeletal: No chest wall abnormality. No acute or significant osseous findings. Review of the MIP images confirms the above findings. IMPRESSION: 1. No evidence of a pulmonary embolism. 2. COPD/mild emphysema. 3. Hyperexpansion of left lower lobe which appears due to airways disease and air trapping. 4.  Bronchial wall thickening and mucous plugging in the right lower lobe with associated peribronchovascular opacity, the latter most consistent with atelectasis. Consider a component of bronchopneumonia if there are consistent clinical symptoms. This leads to right lower lobe volume. 5. No other evidence of acute abnormality. 6. Nonspecific 2.2 cm relative hyperattenuating lesion at the dome of the liver, posterior segment of the right lobe. Recommend followup assessment initially with ultrasound. If this does not  visualized or characterized lesion, liver MRI with and without contrast would be recommended. Electronically Signed   By: Lajean Manes M.D.   On: 12/09/2016 10:37   Dg Chest Port 1 View  Result Date: 12/07/2016 CLINICAL DATA:  Acute respiratory failure with hypoxia EXAM: PORTABLE CHEST 1 VIEW COMPARISON:  12/06/2016 FINDINGS: Interval extubation and removal of NG tube. Increasing right lower lobe atelectasis or infiltrate. No confluent opacity on the left. Heart is normal size. No effusions. IMPRESSION: Increasing right base atelectasis or infiltrate. Electronically Signed   By: Rolm Baptise M.D.   On: 12/07/2016 08:25   Dg Chest Port 1 View  Result Date: 12/06/2016 CLINICAL DATA:  ET tube, respiratory failure EXAM: PORTABLE CHEST 1 VIEW COMPARISON:  12/05/2016 FINDINGS: Emphysematous changes in the lungs. Endotracheal tube and NG tube are unchanged. No confluent airspace opacities or effusions. Heart is normal size. IMPRESSION: Emphysema. Stable support devices. No change. Electronically Signed   By: Rolm Baptise M.D.   On: 12/06/2016 08:21   Dg Chest Portable 1 View  Result Date: 12/05/2016 CLINICAL DATA:  Intubated EXAM: PORTABLE CHEST 1 VIEW COMPARISON:  10/10/1959 chest radiograph. FINDINGS: Endotracheal tube tip is 4.1 cm above the carina. Enteric tube terminates in the gastric fundus. Stable cardiomediastinal silhouette with normal heart size. No pneumothorax. Minimal blunting of the left costophrenic angle. No right pleural effusion. No pulmonary edema. Emphysema. No acute consolidative airspace disease. IMPRESSION: 1. Well-positioned endotracheal and enteric tubes. 2. Emphysema.  No acute pulmonary disease. 3. Minimal blunting of the left costophrenic angle, favor chronic mild pleural-parenchymal scarring. No significant pleural effusions. Electronically Signed   By: Ilona Sorrel M.D.   On: 12/05/2016 20:10    echocardiogram  LV EF: 60% -    65%  ------------------------------------------------------------------- History:   PMH:   Dyspnea.  Risk factors:  Hypertension.  ------------------------------------------------------------------- Study Conclusions  - Left ventricle: The cavity size was normal. Wall thickness was   increased in a pattern of mild LVH. Systolic function was normal.   The estimated ejection Villegas was in the range of 60% to 65%.   Images were inadequate for LV wall motion assessment. Doppler   parameters are consistent with abnormal left ventricular   relaxation (grade 1 diastolic dysfunction). - Aortic valve: There was no stenosis. - Aorta: Dilated aortic root. Aortic root dimension: 44 mm (ED). - Mitral valve: There was no significant regurgitation. - Right ventricle: The cavity size was normal. Systolic function   was normal. - Pulmonary arteries: No complete TR doppler jet so unable to   estimate PA systolic pressure. - Inferior vena cava: The vessel was normal in size. The   respirophasic diameter changes were in the normal range (= 50%),   consistent with normal central venous pressure.  Impressions:  - Technically difficult study with poor acoustic windows. Overall,   LV EF appeared normal, estimated 60-65%. Normal RV size and   systolic function. No significant valvular abnormalities. Dilated   aortic root to 4.4 cm      Filed Weights   12/09/16 0500  12/10/16 2725 12/11/16 0353  Weight: 73.7 kg (162 lb 8 oz) 73.7 kg (162 lb 8 oz) 74.4 kg (164 lb)     Microbiology: Recent Results (from the past 240 hour(s))  Urine culture     Status: None   Collection Time: 12/05/16  8:12 PM  Result Value Ref Range Status   Specimen Description URINE, CATHETERIZED  Final   Special Requests NONE  Final   Culture NO GROWTH  Final   Report Status 12/07/2016 FINAL  Final  Blood Culture (routine x 2)     Status: None   Collection Time: 12/05/16  8:27 PM  Result Value Ref Range Status    Specimen Description BLOOD RIGHT HAND  Final   Special Requests IN PEDIATRIC BOTTLE 2.5CC  Final   Culture NO GROWTH 5 DAYS  Final   Report Status 12/10/2016 FINAL  Final  Blood Culture (routine x 2)     Status: None   Collection Time: 12/05/16  8:35 PM  Result Value Ref Range Status   Specimen Description BLOOD LEFT HAND  Final   Special Requests BOTTLES DRAWN AEROBIC AND ANAEROBIC 5CC  Final   Culture NO GROWTH 5 DAYS  Final   Report Status 12/10/2016 FINAL  Final  MRSA PCR Screening     Status: None   Collection Time: 12/06/16  1:03 AM  Result Value Ref Range Status   MRSA by PCR NEGATIVE NEGATIVE Final    Comment:        The GeneXpert MRSA Assay (FDA approved for NASAL specimens only), is one component of a comprehensive MRSA colonization surveillance program. It is not intended to diagnose MRSA infection nor to guide or monitor treatment for MRSA infections.   C difficile quick scan w PCR reflex     Status: None   Collection Time: 12/09/16  3:11 PM  Result Value Ref Range Status   C Diff antigen NEGATIVE NEGATIVE Final   C Diff toxin NEGATIVE NEGATIVE Final   C Diff interpretation No C. difficile detected.  Final  Gastrointestinal Panel by PCR , Stool     Status: None   Collection Time: 12/09/16  3:11 PM  Result Value Ref Range Status   Campylobacter species NOT DETECTED NOT DETECTED Final   Plesimonas shigelloides NOT DETECTED NOT DETECTED Final   Salmonella species NOT DETECTED NOT DETECTED Final   Yersinia enterocolitica NOT DETECTED NOT DETECTED Final   Vibrio species NOT DETECTED NOT DETECTED Final   Vibrio cholerae NOT DETECTED NOT DETECTED Final   Enteroaggregative E coli (EAEC) NOT DETECTED NOT DETECTED Final   Enteropathogenic E coli (EPEC) NOT DETECTED NOT DETECTED Final   Enterotoxigenic E coli (ETEC) NOT DETECTED NOT DETECTED Final   Shiga like toxin producing E coli (STEC) NOT DETECTED NOT DETECTED Final   Shigella/Enteroinvasive E coli (EIEC) NOT  DETECTED NOT DETECTED Final   Cryptosporidium NOT DETECTED NOT DETECTED Final   Cyclospora cayetanensis NOT DETECTED NOT DETECTED Final   Entamoeba histolytica NOT DETECTED NOT DETECTED Final   Giardia lamblia NOT DETECTED NOT DETECTED Final   Adenovirus F40/41 NOT DETECTED NOT DETECTED Final   Astrovirus NOT DETECTED NOT DETECTED Final   Norovirus GI/GII NOT DETECTED NOT DETECTED Final   Rotavirus A NOT DETECTED NOT DETECTED Final   Sapovirus (I, II, IV, and V) NOT DETECTED NOT DETECTED Final       Blood Culture    Component Value Date/Time   SDES BLOOD LEFT HAND 12/05/2016 2035   SPECREQUEST BOTTLES DRAWN  AEROBIC AND ANAEROBIC 5CC 12/05/2016 2035   CULT NO GROWTH 5 DAYS 12/05/2016 2035   REPTSTATUS 12/10/2016 FINAL 12/05/2016 2035      Labs: Results for orders placed or performed during the hospital encounter of 12/05/16 (from the past 48 hour(s))  C difficile quick scan w PCR reflex     Status: None   Collection Time: 12/09/16  3:11 PM  Result Value Ref Range   C Diff antigen NEGATIVE NEGATIVE   C Diff toxin NEGATIVE NEGATIVE   C Diff interpretation No C. difficile detected.   Gastrointestinal Panel by PCR , Stool     Status: None   Collection Time: 12/09/16  3:11 PM  Result Value Ref Range   Campylobacter species NOT DETECTED NOT DETECTED   Plesimonas shigelloides NOT DETECTED NOT DETECTED   Salmonella species NOT DETECTED NOT DETECTED   Yersinia enterocolitica NOT DETECTED NOT DETECTED   Vibrio species NOT DETECTED NOT DETECTED   Vibrio cholerae NOT DETECTED NOT DETECTED   Enteroaggregative E coli (EAEC) NOT DETECTED NOT DETECTED   Enteropathogenic E coli (EPEC) NOT DETECTED NOT DETECTED   Enterotoxigenic E coli (ETEC) NOT DETECTED NOT DETECTED   Shiga like toxin producing E coli (STEC) NOT DETECTED NOT DETECTED   Shigella/Enteroinvasive E coli (EIEC) NOT DETECTED NOT DETECTED   Cryptosporidium NOT DETECTED NOT DETECTED   Cyclospora cayetanensis NOT DETECTED NOT  DETECTED   Entamoeba histolytica NOT DETECTED NOT DETECTED   Giardia lamblia NOT DETECTED NOT DETECTED   Adenovirus F40/41 NOT DETECTED NOT DETECTED   Astrovirus NOT DETECTED NOT DETECTED   Norovirus GI/GII NOT DETECTED NOT DETECTED   Rotavirus A NOT DETECTED NOT DETECTED   Sapovirus (I, II, IV, and V) NOT DETECTED NOT DETECTED  Magnesium     Status: None   Collection Time: 12/10/16  9:05 AM  Result Value Ref Range   Magnesium 2.3 1.7 - 2.4 mg/dL  CBC with Differential/Platelet     Status: Abnormal   Collection Time: 12/10/16  9:05 AM  Result Value Ref Range   WBC 10.6 (H) 4.0 - 10.5 K/uL   RBC 3.69 (L) 4.22 - 5.81 MIL/uL   Hemoglobin 10.5 (L) 13.0 - 17.0 g/dL   HCT 32.9 (L) 39.0 - 52.0 %   MCV 89.2 78.0 - 100.0 fL   MCH 28.5 26.0 - 34.0 pg   MCHC 31.9 30.0 - 36.0 g/dL   RDW 14.0 11.5 - 15.5 %   Platelets 212 150 - 400 K/uL   Neutrophils Relative % 83 %   Lymphocytes Relative 12 %   Monocytes Relative 5 %   Eosinophils Relative 0 %   Basophils Relative 0 %   Neutro Abs 8.8 (H) 1.7 - 7.7 K/uL   Lymphs Abs 1.3 0.7 - 4.0 K/uL   Monocytes Absolute 0.5 0.1 - 1.0 K/uL   Eosinophils Absolute 0.0 0.0 - 0.7 K/uL   Basophils Absolute 0.0 0.0 - 0.1 K/uL   WBC Morphology TOXIC GRANULATION   Comprehensive metabolic panel     Status: Abnormal   Collection Time: 12/10/16  9:05 AM  Result Value Ref Range   Sodium 140 135 - 145 mmol/L   Potassium 4.5 3.5 - 5.1 mmol/L   Chloride 107 101 - 111 mmol/L   CO2 28 22 - 32 mmol/L   Glucose, Bld 110 (H) 65 - 99 mg/dL   BUN 35 (H) 6 - 20 mg/dL   Creatinine, Ser 1.18 0.61 - 1.24 mg/dL   Calcium 8.9 8.9 - 10.3  mg/dL   Total Protein 5.4 (L) 6.5 - 8.1 g/dL   Albumin 2.6 (L) 3.5 - 5.0 g/dL   AST 35 15 - 41 U/L   ALT 30 17 - 63 U/L   Alkaline Phosphatase 53 38 - 126 U/L   Total Bilirubin 0.2 (L) 0.3 - 1.2 mg/dL   GFR calc non Af Amer 60 (L) >60 mL/min   GFR calc Af Amer >60 >60 mL/min    Comment: (NOTE) The eGFR has been calculated using the  CKD EPI equation. This calculation has not been validated in all clinical situations. eGFR's persistently <60 mL/min signify possible Chronic Kidney Disease.    Anion gap 5 5 - 15        HPI :  William Villegas is a 71 y.o. male with PMH of COPD who presented to [facility] ED 12/28 with worsening SOB.  Symptoms started a 2 days prior and were associated with productive cough (green sputum) and subjective fevers. Pt's son had been sick for the few days prior to that, but otherwise, no exposures to known sick contacts. On arrival to ED, pt was minimally responsive and required intubation for respiratory distress and for airway protection.  PCCM was asked to admit.  Following day, pt able to wean on PSV 5/5.   extubated 12/29  HOSPITAL COURSE:   Acute respiratory failure with hypoxia  Started on antibiotics on 12/28, will continue for another 5 days -Flutter valve -Initially received IV Solu-Medrol, changed  to PO prednisone -DuoNeb QID -Brovana neb BID -Pulmicort neb BID -Mucinex DM -Changed  brovana/pulmicort> change to anoro at discahrge CT PE study no evidence of PE, COPD/5 accessory, suspected bronchopneumonia, therefore course of antibiotic extended for another 5 days Echo shows normal EF, dilated aortic root  Sinus tachycardia-patient maintained sinus rhythm, but continued to be tachycardic because of his hypoxic respiratory failure He has been started on bisoprolol 5 mg a day  COPD exacerbation/CAP Influenza A Positive Completed Tamiflu  Troponin leak  - suspect demand continue to trend  HTN -Tele monitoring showed sinus tachycardia -Started on Cardizem 30 mg  QID, now switched to Cardizem CD 180 mg a day -Held Lisinopril 10 mg daily. May resume her renal function is stable  Acute encephalopathy -> resolved  Deconditioning -PT; recommends 24-hour home supervision.   home health for advanced COPD  2.2 cm mass posterior segment of right liver lobe Will need  outpatient MRI for further evaluation  Diarrhea-negative for C. difficile Result     Discharge Exam:   Blood pressure (!) 146/84, pulse 96, temperature 97.9 F (36.6 C), temperature source Oral, resp. rate 17, height '5\' 9"'$  (1.753 m), weight 74.4 kg (164 lb), SpO2 91 %.  General: A/O 4, positive acute respiratory distress, cachectic Eyes: negative scleral hemorrhage, negative anisocoria, negative icterus ENT: Negative Runny nose, negative gingival bleeding, Neck:  Negative scars, masses, torticollis, lymphadenopathy, JVD Lungs: tachypnea, diffuse poor air movement, diffuse expiratory wheeze, negative crackles  Cardiovascular: Tachycardic, Regular rhythm without murmur gallop or rub normal S1 and S2 Abdomen: negative abdominal pain, nondistended, positive soft, bowel sounds, no rebound, no ascites, no appreciable mass Extremities: No significant cyanosis, clubbing, or edema bilateral lower extremities Skin: Negative rashes, lesions, ulcers Psychiatric:  Negative depression, negative anxiety, negative fatigue, negative mania     Follow-up Information    PICKARD,WARREN TOM, MD. Call.   Specialty:  Family Medicine Why:  Hospital follow-up in one to 2 days Contact information: [address]  [place] [address] [phone]           Signed: Reyne Dumas 12/11/2016, 8:10 AM        Time spent >45 mins

## 2016-12-12 ENCOUNTER — Inpatient Hospital Stay: Payer: Self-pay | Admitting: Physician Assistant

## 2016-12-19 ENCOUNTER — Inpatient Hospital Stay: Payer: Self-pay | Admitting: Physician Assistant

## 2017-07-23 ENCOUNTER — Other Ambulatory Visit: Payer: Self-pay | Admitting: Family Medicine

## 2017-10-08 IMAGING — CR DG CHEST 2V
3 series · 3 of 3 positions shown · non-contrast
Comparison: Chest x-ray of August 30, 2013, and October 06, 2012.

CLINICAL DATA: Shortness of breath, abnormal chest exam on the
left, history of COPD and hypertension, current smoker.

EXAM:
CHEST  2 VIEW

[view not recorded (1 of 3)]
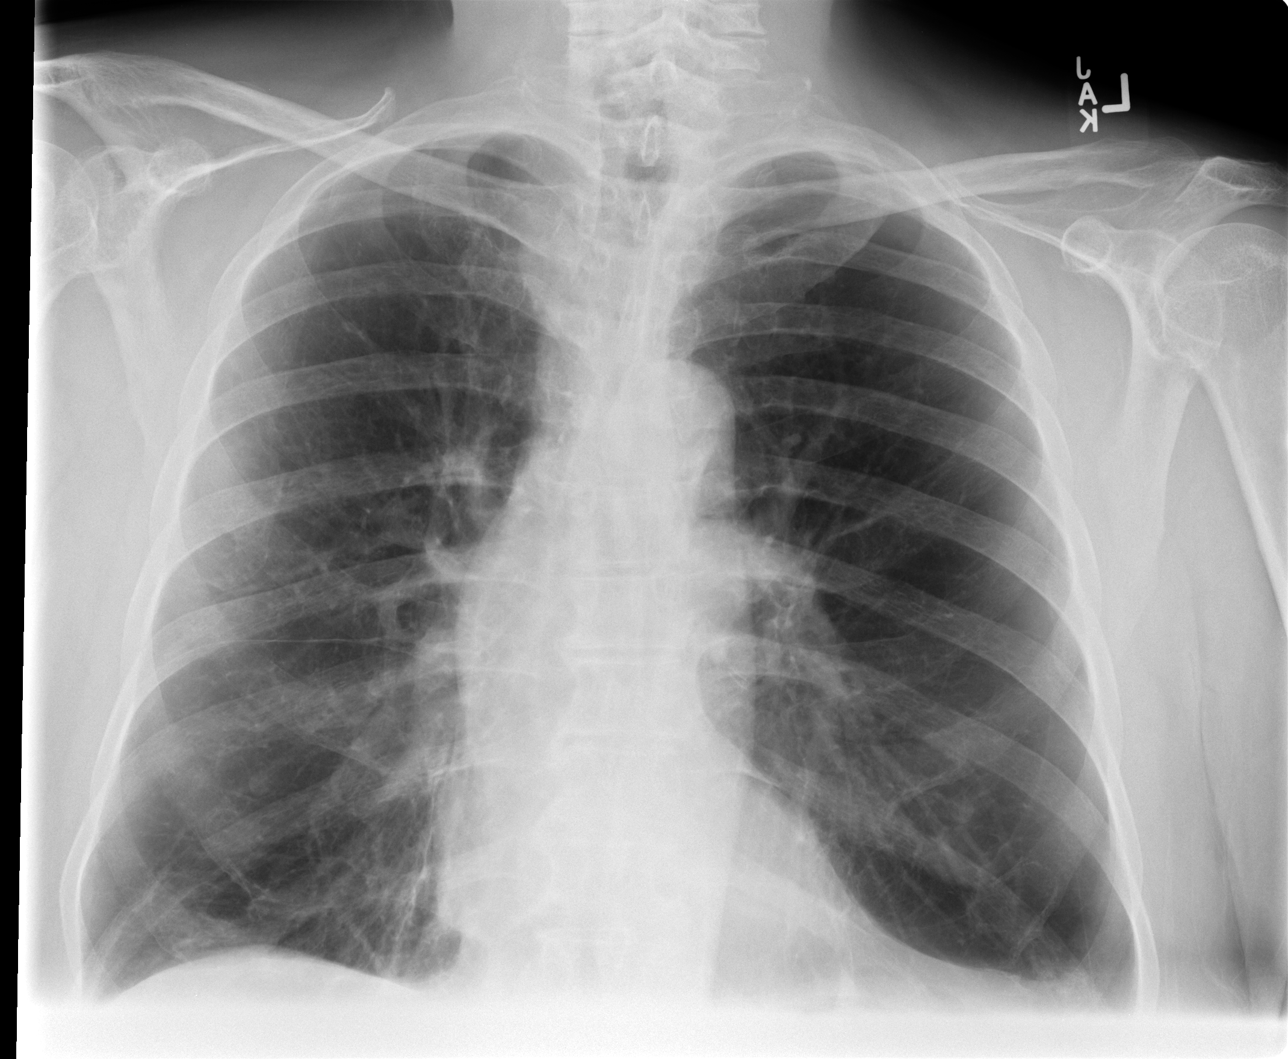

[view not recorded (2 of 3)]
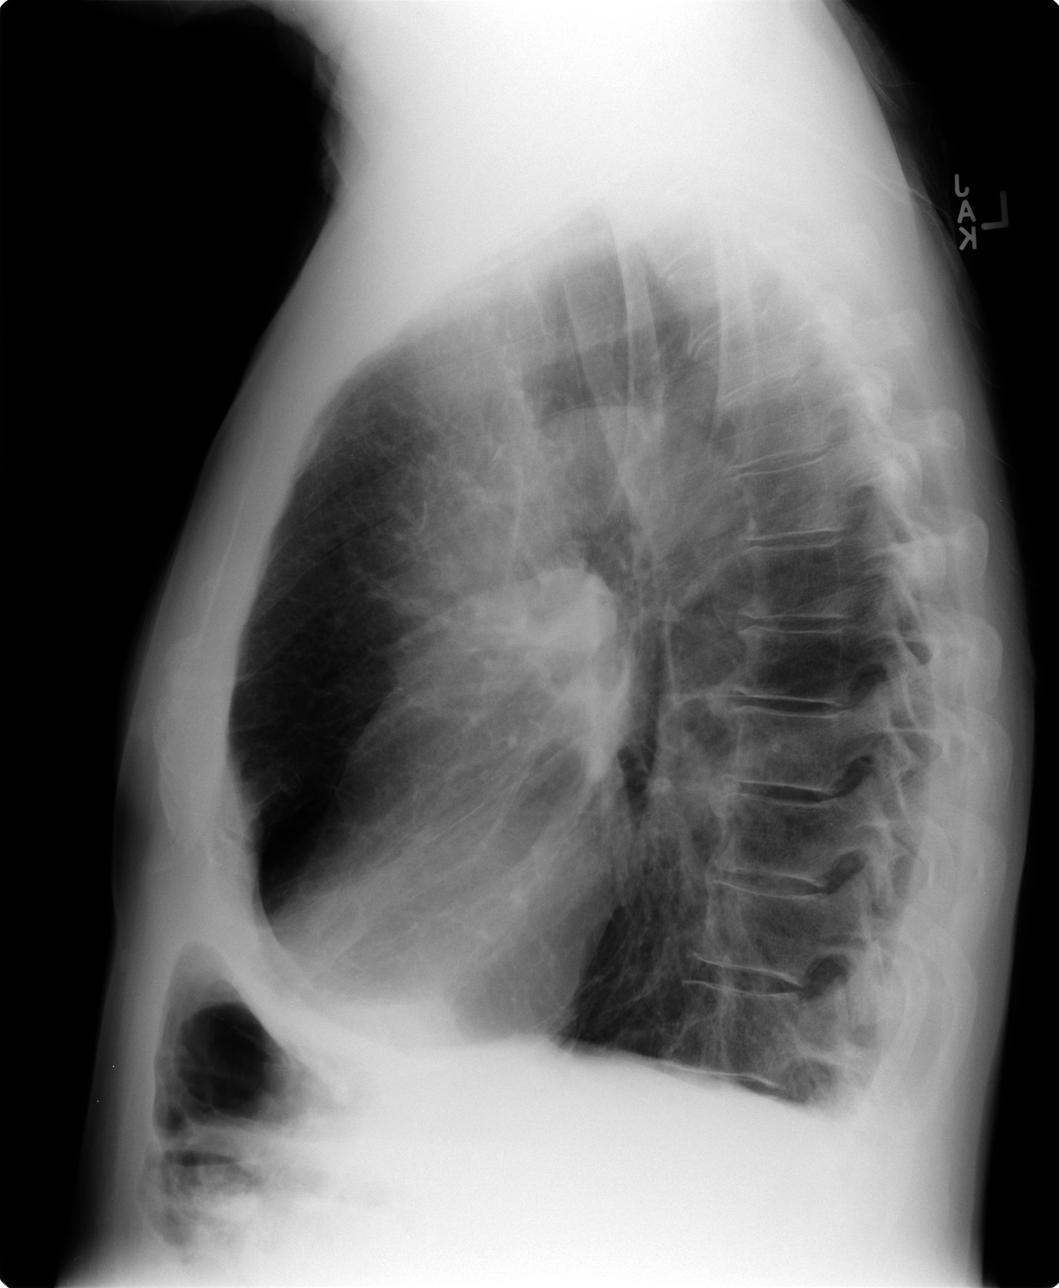

[view not recorded (3 of 3)]
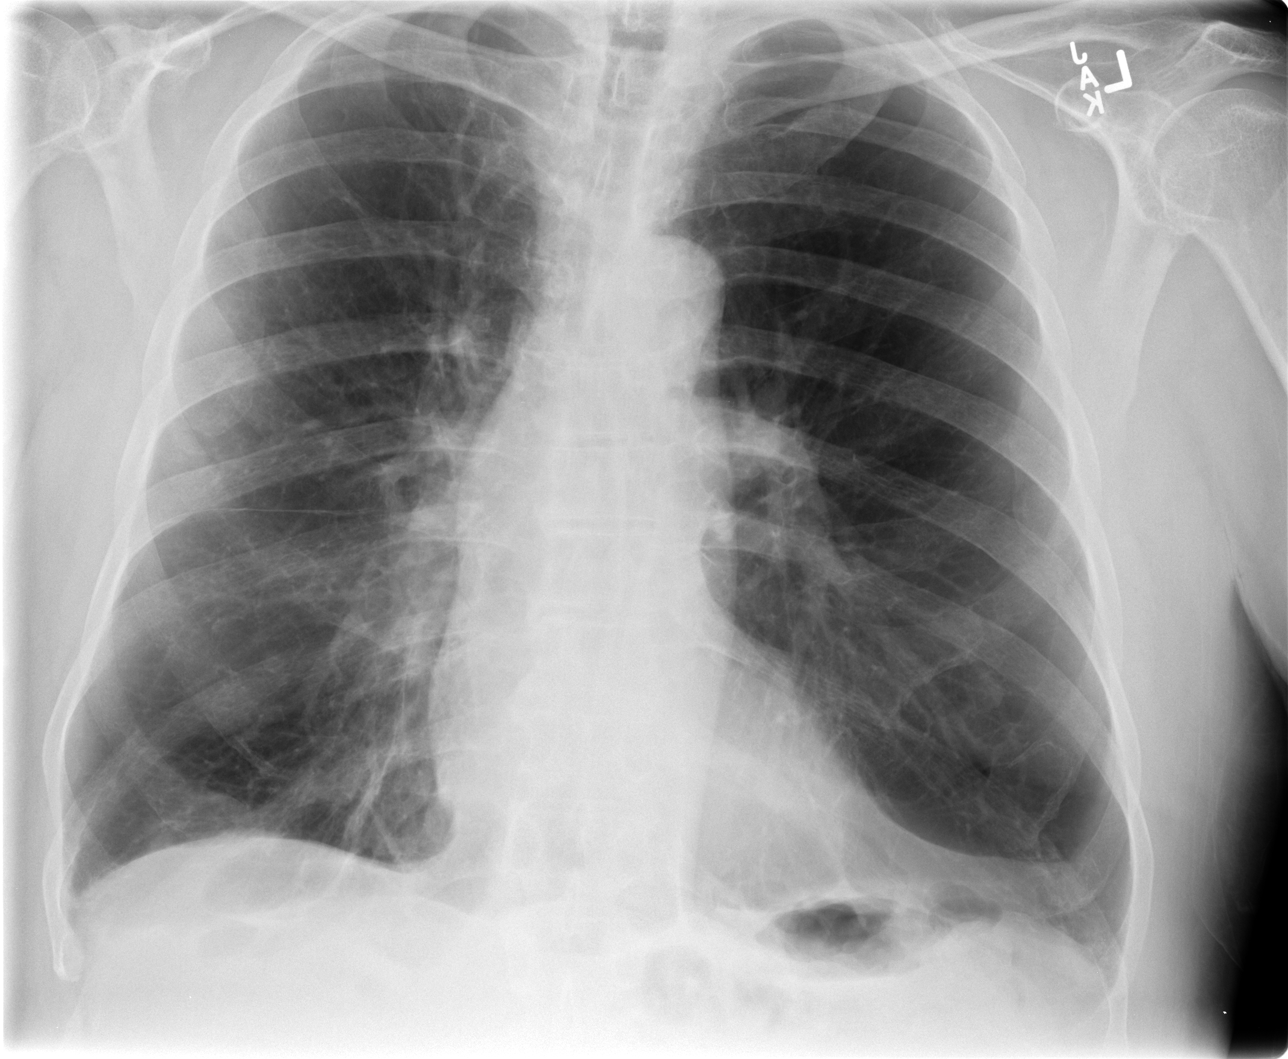

[3 of 3 positions shown; findings below may reference images not displayed]

FINDINGS: The lungs remain hyperinflated. There is hemidiaphragm flattening
and increased AP dimension of the thorax. There is no alveolar
infiltrate. There is no pleural effusion. The heart and pulmonary
vascularity are normal. The mediastinum is normal in width. The bony
thorax exhibits no acute abnormality.
IMPRESSION: COPD. There is no evidence of CHF, pneumonia, nor other acute
cardiopulmonary abnormality.

## 2017-10-10 ENCOUNTER — Encounter: Payer: Self-pay | Admitting: Family Medicine

## 2017-10-10 ENCOUNTER — Ambulatory Visit (INDEPENDENT_AMBULATORY_CARE_PROVIDER_SITE_OTHER): Payer: Medicare PPO | Admitting: Family Medicine

## 2017-10-10 ENCOUNTER — Ambulatory Visit (HOSPITAL_COMMUNITY)
Admission: RE | Admit: 2017-10-10 | Discharge: 2017-10-10 | Disposition: A | Discharge status: Home / Self Care | Payer: Medicare PPO | Source: Ambulatory Visit | Attending: Family Medicine | Admitting: Family Medicine

## 2017-10-10 VITALS — BP 156/74 | HR 96 | Temp 97.8°F | Resp 18 | Ht 68.5 in | Wt 123.0 lb

## 2017-10-10 DIAGNOSIS — Z23 Encounter for immunization: Secondary | ICD-10-CM | POA: Diagnosis not present

## 2017-10-10 DIAGNOSIS — Z0000 Encounter for general adult medical examination without abnormal findings: Secondary | ICD-10-CM

## 2017-10-10 DIAGNOSIS — Z125 Encounter for screening for malignant neoplasm of prostate: Secondary | ICD-10-CM

## 2017-10-10 DIAGNOSIS — R1310 Dysphagia, unspecified: Secondary | ICD-10-CM | POA: Diagnosis not present

## 2017-10-10 DIAGNOSIS — J438 Other emphysema: Secondary | ICD-10-CM | POA: Diagnosis not present

## 2017-10-10 DIAGNOSIS — R634 Abnormal weight loss: Secondary | ICD-10-CM | POA: Diagnosis not present

## 2017-10-10 DIAGNOSIS — Z87891 Personal history of nicotine dependence: Secondary | ICD-10-CM

## 2017-10-10 DIAGNOSIS — R1319 Other dysphagia: Secondary | ICD-10-CM

## 2017-10-10 MED ORDER — PANTOPRAZOLE SODIUM 40 MG PO TBEC: 40.0000 mg | DELAYED_RELEASE_TABLET | Freq: Two times a day (BID) | ORAL | 3 refills | Status: DC

## 2017-10-10 NOTE — Addendum Note (Signed)
Addended by: Shary Decamp B on: 10/10/2017 04:30 PM   Modules accepted: Orders

## 2017-10-10 NOTE — Progress Notes (Signed)
Subjective:    Patient ID: William Villegas, male    DOB: 26-Sep-1946, 71 y.o.   MRN: [id number]  HPI  Patient appears wasted, frail, and cachectic.  I have not seen the patient for more than a year however I am extremely concerned because he has lost more than 30 pounds which is 20% of his body weight since I last saw him.  He reports that over the last 2 months, he has developed progressive dysphasia.  He is unable to swallow solid foods such as steak or hamburger or chicken.  He is able to drink water and soft foods such as chili and mashed potatoes.  He reports having severe heartburn.  Food frequently comes back up even before he can swallow.  Past medical history is significant for COPD and heavy smoking.  Patient has quit smoking since last year when he was hospitalized with pneumonia.  He denies any hemoptysis.  He denies any pleurisy.  He denies any fevers or chills.  He originally presented for a complete physical exam.  However his appearance is extremely concerning Past Medical History:  Diagnosis Date  . COPD (chronic obstructive pulmonary disease) ([place])   . Hypertension    No past surgical history on file. Current Outpatient Prescriptions on File Prior to Visit  Medication Sig Dispense Refill  . aspirin 81 MG tablet Take 81 mg by mouth daily.    Marland Kitchen umeclidinium-vilanterol (ANORO ELLIPTA) 62.5-25 MCG/INH AEPB Inhale 1 puff into the lungs daily. 30 each 0  . albuterol (PROVENTIL HFA;VENTOLIN HFA) 108 (90 BASE) MCG/ACT inhaler Inhale 2 puffs into the lungs every 6 (six) hours as needed for wheezing. (Patient not taking: Reported on 10/10/2017) 3 Inhaler 1  . albuterol (PROVENTIL) (2.5 MG/3ML) 0.083% nebulizer solution Take 3 mLs (2.5 mg total) by nebulization every 6 (six) hours as needed for wheezing or shortness of breath. (Patient not taking: Reported on 10/10/2017) 150 mL 2  . bisoprolol (ZEBETA) 5 MG tablet Take 1 tablet (5 mg total) by mouth daily. (Patient not taking: Reported on  10/10/2017) 30 tablet 0  . diltiazem (CARDIZEM CD) 180 MG 24 hr capsule Take 1 capsule (180 mg total) by mouth daily. (Patient not taking: Reported on 10/10/2017) 30 capsule 2  . pravastatin (PRAVACHOL) 40 MG tablet TAKE 1 TABLET EVERY DAY (Patient not taking: Reported on 10/10/2017) 90 tablet 1   No current facility-administered medications on file prior to visit.    No Known Allergies Social History   Social History  . Marital status: Divorced    Spouse name: N/A  . Number of children: N/A  . Years of education: N/A   Occupational History  . Not on file.   Social History Main Topics  . Smoking status: Former Smoker    Packs/day: 0.50    Years: 25.00    Types: Cigarettes    Quit date: 11/08/2016  . Smokeless tobacco: Never Used  . Alcohol use No  . Drug use: No  . Sexual activity: Not on file   Other Topics Concern  . Not on file   Social History Narrative  . No narrative on file   No family history on file.    Review of Systems  All other systems reviewed and are negative.      Objective:   Physical Exam  Constitutional: He is oriented to person, place, and time. He appears cachectic. He has a sickly appearance.  HENT:  Head: Normocephalic and atraumatic.  Right Ear: External ear  normal.  Left Ear: External ear normal.  Nose: Nose normal.  Mouth/Throat: Oropharynx is clear and moist. No oropharyngeal exudate.  Eyes: Pupils are equal, round, and reactive to light. Conjunctivae and EOM are normal. Right eye exhibits no discharge. Left eye exhibits no discharge. No scleral icterus.  Neck: Normal range of motion. Neck supple. No JVD present. No tracheal deviation present. No thyromegaly present.  Cardiovascular: Normal rate, regular rhythm and normal heart sounds.  Exam reveals no gallop and no friction rub.   No murmur heard. Pulmonary/Chest: Effort normal. No stridor. No respiratory distress. He has decreased breath sounds. He has no wheezes. He has rales in the  right lower field. He exhibits no tenderness.  Abdominal: Soft. Bowel sounds are normal. He exhibits no distension and no mass. There is no tenderness. There is no rebound and no guarding.  Musculoskeletal: Normal range of motion. He exhibits no edema.  Lymphadenopathy:    He has no cervical adenopathy.  Neurological: He is alert and oriented to person, place, and time. He displays normal reflexes. No cranial nerve deficit. He exhibits normal muscle tone. Coordination normal.  Skin: Skin is warm. No rash noted. No erythema. No pallor.  Psychiatric: He has a normal mood and affect. His behavior is normal. Judgment and thought content normal.          Assessment & Plan:  Weight loss of more than 10% body weight - Plan: CBC with Differential/Platelet, COMPLETE METABOLIC PANEL WITH GFR, Lipid panel, PSA, TSH, DG Chest 2 View, Ambulatory referral to Gastroenterology  General medical exam  Prostate cancer screening  History of smoking  Esophageal dysphagia - Plan: Ambulatory referral to Gastroenterology  Patient's appearance is extremely concerning here.  I was honest with the patient and his son who accompanies him today.  I am concerned that the patient has a malignancy either in his lung pressing against his esophagus or in the esophagus itself causing his dysphasia and weight loss.  Therefore I recommended urgent GI consultation for an EGD.  Also recommended a chest x-ray immediately particularly given the abnormal findings in his right lung.  If chest x-ray shows abnormalities, CT scan of the chest.  Patient would like to try medication for acid reflux which I am willing to try but I was clear that I do not believe this is simply acid reflux.  He can certainly try Protonix 40 mg pobid.  Patient received his flu shot today.  An additional, I will check a CBC, CMP, fasting lipid panel, PSA, and a TSH.  Await results of his lab work, chest x-ray, and GI consult/EGD to determine next course of  action.

## 2017-10-13 ENCOUNTER — Encounter: Payer: Self-pay | Admitting: Physician Assistant

## 2017-10-14 LAB — CBC WITH DIFFERENTIAL/PLATELET
Basophils Absolute: 34 cells/uL (ref 0–200)
Basophils Relative: 0.4 %
Eosinophils Absolute: 255 cells/uL (ref 15–500)
Eosinophils Relative: 3 %
HEMATOCRIT: 26.9 % — AB (ref 38.5–50.0)
HEMOGLOBIN: 8.7 g/dL — AB (ref 13.2–17.1)
LYMPHS ABS: 1139 {cells}/uL (ref 850–3900)
MCH: 25.4 pg — ABNORMAL LOW (ref 27.0–33.0)
MCHC: 32.3 g/dL (ref 32.0–36.0)
MCV: 78.7 fL — AB (ref 80.0–100.0)
MPV: 10.3 fL (ref 7.5–12.5)
Monocytes Relative: 8.5 %
NEUTROS ABS: 6350 {cells}/uL (ref 1500–7800)
NEUTROS PCT: 74.7 %
Platelets: 395 10*3/uL (ref 140–400)
RBC: 3.42 10*6/uL — AB (ref 4.20–5.80)
RDW: 15.6 % — AB (ref 11.0–15.0)
Total Lymphocyte: 13.4 %
WBC: 8.5 10*3/uL (ref 3.8–10.8)
WBCMIX: 723 {cells}/uL (ref 200–950)

## 2017-10-14 LAB — COMPLETE METABOLIC PANEL WITH GFR
AG RATIO: 1.2 (calc) (ref 1.0–2.5)
ALBUMIN MSPROF: 3.4 g/dL — AB (ref 3.6–5.1)
ALT: 6 U/L — ABNORMAL LOW (ref 9–46)
AST: 17 U/L (ref 10–35)
Alkaline phosphatase (APISO): 91 U/L (ref 40–115)
BUN / CREAT RATIO: 31 (calc) — AB (ref 6–22)
BUN: 28 mg/dL — ABNORMAL HIGH (ref 7–25)
CALCIUM: 8.8 mg/dL (ref 8.6–10.3)
CHLORIDE: 104 mmol/L (ref 98–110)
CO2: 25 mmol/L (ref 20–32)
CREATININE: 0.91 mg/dL (ref 0.70–1.18)
GFR, EST AFRICAN AMERICAN: 97 mL/min/{1.73_m2} (ref >=60)
GFR, EST NON AFRICAN AMERICAN: 83 mL/min/{1.73_m2} (ref >=60)
GLOBULIN: 2.8 g/dL (ref 1.9–3.7)
Glucose, Bld: 96 mg/dL (ref 65–99)
POTASSIUM: 5.2 mmol/L (ref 3.5–5.3)
SODIUM: 136 mmol/L (ref 135–146)
TOTAL PROTEIN: 6.2 g/dL (ref 6.1–8.1)
Total Bilirubin: 0.3 mg/dL (ref 0.2–1.2)

## 2017-10-14 LAB — TEST AUTHORIZATION

## 2017-10-14 LAB — VITAMIN B12: VITAMIN B 12: 290 pg/mL (ref 200–1100)

## 2017-10-14 LAB — IRON, TOTAL/TOTAL IRON BINDING CAP
%SAT: 7 % (calc) — ABNORMAL LOW (ref 15–60)
Iron: 21 ug/dL — ABNORMAL LOW (ref 50–180)
TIBC: 284 mcg/dL (calc) (ref 250–425)

## 2017-10-14 LAB — LIPID PANEL
Cholesterol: 138 mg/dL (ref <200)
HDL: 47 mg/dL (ref >40)
LDL CHOLESTEROL (CALC): 76 mg/dL
Non-HDL Cholesterol (Calc): 91 mg/dL (calc) (ref <130)
Total CHOL/HDL Ratio: 2.9 (calc) (ref <5.0)
Triglycerides: 72 mg/dL (ref <150)

## 2017-10-14 LAB — PSA: PSA: 2.6 ng/mL (ref <=4.0)

## 2017-10-14 LAB — TSH: TSH: 2.96 mIU/L (ref 0.40–4.50)

## 2017-10-17 ENCOUNTER — Other Ambulatory Visit: Payer: Self-pay | Admitting: Family Medicine

## 2017-10-18 ENCOUNTER — Other Ambulatory Visit: Payer: Self-pay | Admitting: Family Medicine

## 2017-10-19 ENCOUNTER — Other Ambulatory Visit: Payer: Self-pay | Admitting: Family Medicine

## 2017-10-22 ENCOUNTER — Encounter: Payer: Self-pay | Admitting: Physician Assistant

## 2017-10-22 ENCOUNTER — Ambulatory Visit: Payer: Medicare PPO | Admitting: Physician Assistant

## 2017-10-22 ENCOUNTER — Encounter (HOSPITAL_COMMUNITY): Payer: Self-pay | Admitting: *Deleted

## 2017-10-22 ENCOUNTER — Other Ambulatory Visit: Payer: Self-pay

## 2017-10-22 VITALS — BP 114/68 | HR 84 | Ht 69.0 in | Wt 123.0 lb

## 2017-10-22 DIAGNOSIS — R1319 Other dysphagia: Secondary | ICD-10-CM | POA: Diagnosis not present

## 2017-10-22 DIAGNOSIS — D509 Iron deficiency anemia, unspecified: Secondary | ICD-10-CM

## 2017-10-22 DIAGNOSIS — R634 Abnormal weight loss: Secondary | ICD-10-CM

## 2017-10-22 NOTE — Progress Notes (Signed)
Subjective:    Patient ID: William Villegas, male    DOB: 02-Oct-1946, 71 y.o.   MRN: [id number]  HPI William Villegas is a 71 year old white male, new to GI today referred by Dr. Jenna Luo for evaluation of new onset of severe dysphagia. Patient has history of COPD, uses nasal O2 at 2 L generally for several hours each day, also with history of hypertension. He relates having screening endoscopy and colonoscopy done probably 20 years ago but has not seen GI since. Patient states he had never had any difficulty swallowing in the past, and current symptoms started between 3 and 4 months ago. He says he is having trouble every time he tries to eat with sensation that food is getting stuck. He has discomfort with this off and on. He is also having intermittent episodes of vomiting or regurgitation. He is pretty much stopped eating solid food because it won't go down and has been eating very soft mushy foods like mashed potatoes. He denies any problems with heartburn or indigestion. His appetite is been okay but he is lost about 30 pounds over the past 6-7 weeks. He denies any abdominal pain. He has not noticed any hematemesis and has not noticed any melena or hematochezia. Labs done on 10/10/2017 showed hemoglobin of 8.7 hematocrit of 26.9 MCV of 78.7 WBC of 8.5. Serum iron 21, TIBC 284 and iron saturation of 7  Review of Systems Pertinent positive and negative review of systems were noted in the above HPI section.  All other review of systems was otherwise negative.  Outpatient Encounter Medications as of 10/22/2017  Medication Sig  . aspirin 81 MG tablet Take 81 mg by mouth daily.  . OXYGEN Inhale 2 L into the lungs continuous.  Marland Kitchen umeclidinium-vilanterol (ANORO ELLIPTA) 62.5-25 MCG/INH AEPB Inhale 1 puff into the lungs daily.  . [DISCONTINUED] albuterol (PROVENTIL HFA;VENTOLIN HFA) 108 (90 BASE) MCG/ACT inhaler Inhale 2 puffs into the lungs every 6 (six) hours as needed for wheezing. (Patient not  taking: Reported on 10/10/2017)  . [DISCONTINUED] albuterol (PROVENTIL) (2.5 MG/3ML) 0.083% nebulizer solution Take 3 mLs (2.5 mg total) by nebulization every 6 (six) hours as needed for wheezing or shortness of breath. (Patient not taking: Reported on 10/10/2017)  . [DISCONTINUED] bisoprolol (ZEBETA) 5 MG tablet Take 1 tablet (5 mg total) by mouth daily. (Patient not taking: Reported on 10/10/2017)  . [DISCONTINUED] diltiazem (CARDIZEM CD) 180 MG 24 hr capsule Take 1 capsule (180 mg total) by mouth daily. (Patient not taking: Reported on 10/10/2017)  . [DISCONTINUED] pantoprazole (PROTONIX) 40 MG tablet Take 1 tablet (40 mg total) by mouth 2 (two) times daily.  . [DISCONTINUED] pravastatin (PRAVACHOL) 40 MG tablet TAKE 1 TABLET EVERY DAY (Patient not taking: Reported on 10/10/2017)   No facility-administered encounter medications on file as of 10/22/2017.    No Known Allergies Patient Active Problem List   Diagnosis Date Noted  . Respiratory distress   . Essential hypertension   . Acute encephalopathy   . Community acquired pneumonia of right lower lobe of lung ([place])   . Influenza due to identified novel influenza A virus with other respiratory manifestations   . Acute respiratory failure with hypoxia ([place]) 12/05/2016  . COPD exacerbation ([place])   . Nicotine dependence 09/01/2013  . Acute respiratory distress 08/30/2013  . SOB (shortness of breath) 08/30/2013  . COPD (chronic obstructive pulmonary disease) with acute bronchitis ([place]) 08/30/2013  . HTN (hypertension) 08/30/2013   Social History   Socioeconomic History  .  Marital status: Divorced    Spouse name: Not on file  . Number of children: Not on file  . Years of education: Not on file  . Highest education level: Not on file  Social Needs  . Financial resource strain: Not on file  . Food insecurity - worry: Not on file  . Food insecurity - inability: Not on file  . Transportation needs - medical: Not on file  . Transportation  needs - non-medical: Not on file  Occupational History  . Not on file  Tobacco Use  . Smoking status: Former Smoker    Packs/day: 0.50    Years: 25.00    Pack years: 12.50    Types: Cigarettes    Last attempt to quit: 11/08/2016    Years since quitting: 0.9  . Smokeless tobacco: Never Used  Substance and Sexual Activity  . Alcohol use: No  . Drug use: No  . Sexual activity: Not on file  Other Topics Concern  . Not on file  Social History Narrative  . Not on file    William Villegas family history is not on file.      Objective:    Vitals:   10/22/17 1059  BP: 114/68  Pulse: 84    Physical Exam  well-developed very thin older white male in no acute distress, somewhat irritable, accompanied by family member. Blood pressure 114/68, pulse 84, height 5 foot 9, weight 123, BMI 18.1. HEENT; nontraumatic normocephalic EOMI PERRLA sclera anicteric,  Neck;Supple no palpable adenopathy, Cardiovascular; regular rate and rhythm with S1-S2, Pulmonary; decreased breath sounds bilaterally, Abdomen soft, nontender nondistended no palpable mass or hepatosplenomegaly bowel sounds are present, Rectal ;exam not done, Ext; no clubbing cyanosis or edema skin warm and dry, Neuropsych; mood and affect appropriate       Assessment & Plan:   #53 71 year old white male with acute onset of solid food dysphagia, progressive over the past 3-4 months and associated with 30 pound weight loss, and iron deficiency anemia. I'm concerned that he has an esophageal cancer with rapid weight loss, and probable chronic GI blood loss resulting in iron deficiency anemia. #2 malnutrition secondary to above #3 COPD-uses O2 intermittently through the day, stopped smoking one year ago #4 history of hypertension  Plan; Patient will be scheduled for upper endoscopy with Dr. Fuller Plan at [facility] next week on 10/28/2017. Procedure was discussed in detail with patient including risks and benefits and he is agreeable  to proceed. We discussed the possibility of an esophageal cancer. He is encouraged to stay on a full liquid diet/mashed potato consistency, and add ensure or boost twice daily between meals. Further plans pending results of EGD.  Tanaya Dunigan Genia Harold PA-C 10/22/2017   Cc: Susy Frizzle, MD

## 2017-10-22 NOTE — Patient Instructions (Signed)
You have been scheduled for an endoscopy. Please follow written instructions given to you at your visit today. If you use inhalers (even only as needed), please bring them with you on the day of your procedure.  Stay on full liquids/mashed potatoes, apple sauce,  Puddings.  Add Ensure or Boost- 2 cans per day between meals.

## 2017-10-22 NOTE — H&P (View-Only) (Signed)
Subjective:    Patient ID: William Villegas, male    DOB: [birth year], 71 y.o.   MRN: [id number]  HPI William Villegas is a 71 year old white male, new to GI today referred by Dr. Jenna Luo for evaluation of new onset of severe dysphagia. Patient has history of COPD, uses nasal O2 at 2 L generally for several hours each day, also with history of hypertension. He relates having screening endoscopy and colonoscopy done probably 20 years ago but has not seen GI since. Patient states he had never had any difficulty swallowing in the past, and current symptoms started between 3 and 4 months ago. He says he is having trouble every time he tries to eat with sensation that food is getting stuck. He has discomfort with this off and on. He is also having intermittent episodes of vomiting or regurgitation. He is pretty much stopped eating solid food because it won't go down and has been eating very soft mushy foods like mashed potatoes. He denies any problems with heartburn or indigestion. His appetite is been okay but he is lost about 30 pounds over the past 6-7 weeks. He denies any abdominal pain. He has not noticed any hematemesis and has not noticed any melena or hematochezia. Labs done on 10/10/2017 showed hemoglobin of 8.7 hematocrit of 26.9 MCV of 78.7 WBC of 8.5. Serum iron 21, TIBC 284 and iron saturation of 7  Review of Systems Pertinent positive and negative review of systems were noted in the above HPI section.  All other review of systems was otherwise negative.  Outpatient Encounter Medications as of 10/22/2017  Medication Sig  . aspirin 81 MG tablet Take 81 mg by mouth daily.  . OXYGEN Inhale 2 L into the lungs continuous.  Marland Kitchen umeclidinium-vilanterol (ANORO ELLIPTA) 62.5-25 MCG/INH AEPB Inhale 1 puff into the lungs daily.  . [DISCONTINUED] albuterol (PROVENTIL HFA;VENTOLIN HFA) 108 (90 BASE) MCG/ACT inhaler Inhale 2 puffs into the lungs every 6 (six) hours as needed for wheezing. (Patient not  taking: Reported on 10/10/2017)  . [DISCONTINUED] albuterol (PROVENTIL) (2.5 MG/3ML) 0.083% nebulizer solution Take 3 mLs (2.5 mg total) by nebulization every 6 (six) hours as needed for wheezing or shortness of breath. (Patient not taking: Reported on 10/10/2017)  . [DISCONTINUED] bisoprolol (ZEBETA) 5 MG tablet Take 1 tablet (5 mg total) by mouth daily. (Patient not taking: Reported on 10/10/2017)  . [DISCONTINUED] diltiazem (CARDIZEM CD) 180 MG 24 hr capsule Take 1 capsule (180 mg total) by mouth daily. (Patient not taking: Reported on 10/10/2017)  . [DISCONTINUED] pantoprazole (PROTONIX) 40 MG tablet Take 1 tablet (40 mg total) by mouth 2 (two) times daily.  . [DISCONTINUED] pravastatin (PRAVACHOL) 40 MG tablet TAKE 1 TABLET EVERY DAY (Patient not taking: Reported on 10/10/2017)   No facility-administered encounter medications on file as of 10/22/2017.    No Known Allergies Patient Active Problem List   Diagnosis Date Noted  . Respiratory distress   . Essential hypertension   . Acute encephalopathy   . Community acquired pneumonia of right lower lobe of lung ([place])   . Influenza due to identified novel influenza A virus with other respiratory manifestations   . Acute respiratory failure with hypoxia ([place]) 12/05/2016  . COPD exacerbation ([place])   . Nicotine dependence 09/01/2013  . Acute respiratory distress 08/30/2013  . SOB (shortness of breath) 08/30/2013  . COPD (chronic obstructive pulmonary disease) with acute bronchitis ([place]) 08/30/2013  . HTN (hypertension) 08/30/2013   Social History   Socioeconomic History  .  Marital status: Divorced    Spouse name: Not on file  . Number of children: Not on file  . Years of education: Not on file  . Highest education level: Not on file  Social Needs  . Financial resource strain: Not on file  . Food insecurity - worry: Not on file  . Food insecurity - inability: Not on file  . Transportation needs - medical: Not on file  . Transportation  needs - non-medical: Not on file  Occupational History  . Not on file  Tobacco Use  . Smoking status: Former Smoker    Packs/day: 0.50    Years: 25.00    Pack years: 12.50    Types: Cigarettes    Last attempt to quit: 11/08/2016    Years since quitting: 0.9  . Smokeless tobacco: Never Used  Substance and Sexual Activity  . Alcohol use: No  . Drug use: No  . Sexual activity: Not on file  Other Topics Concern  . Not on file  Social History Narrative  . Not on file    William Villegas family history is not on file.      Objective:    Vitals:   10/22/17 1059  BP: 114/68  Pulse: 84    Physical Exam  well-developed very thin older white male in no acute distress, somewhat irritable, accompanied by family member. Blood pressure 114/68, pulse 84, height 5 foot 9, weight 123, BMI 18.1. HEENT; nontraumatic normocephalic EOMI PERRLA sclera anicteric,  Neck;Supple no palpable adenopathy, Cardiovascular; regular rate and rhythm with S1-S2, Pulmonary; decreased breath sounds bilaterally, Abdomen soft, nontender nondistended no palpable mass or hepatosplenomegaly bowel sounds are present, Rectal ;exam not done, Ext; no clubbing cyanosis or edema skin warm and dry, Neuropsych; mood and affect appropriate       Assessment & Plan:   #37 71 year old white male with acute onset of solid food dysphagia, progressive over the past 3-4 months and associated with 30 pound weight loss, and iron deficiency anemia. I'm concerned that he has an esophageal cancer with rapid weight loss, and probable chronic GI blood loss resulting in iron deficiency anemia. #2 malnutrition secondary to above #3 COPD-uses O2 intermittently through the day, stopped smoking one year ago #4 history of hypertension  Plan; Patient will be scheduled for upper endoscopy with Dr. Fuller Plan at [facility] next week on 10/28/2017. Procedure was discussed in detail with patient including risks and benefits and he is agreeable  to proceed. We discussed the possibility of an esophageal cancer. He is encouraged to stay on a full liquid diet/mashed potato consistency, and add ensure or boost twice daily between meals. Further plans pending results of EGD.  Amy Genia Harold PA-C 10/22/2017   Cc: Susy Frizzle, MD

## 2017-10-22 NOTE — Progress Notes (Signed)
Reviewed and agree with management plan.  Catrina Fellenz T. Shjon Lizarraga, MD FACG 

## 2017-10-24 LAB — FECAL GLOBIN BY IMMUNOCHEMISTRY
FECAL GLOBIN RESULT:: DETECTED — AB
FECAL GLOBIN RESULT:: NOT DETECTED
MICRO NUMBER: 81283409
MICRO NUMBER:: 81283414
SPECIMEN QUALITY: ADEQUATE
SPECIMEN QUALITY:: ADEQUATE

## 2017-10-24 LAB — TIQ-NTM

## 2017-10-24 LAB — SPECIMEN ID NOTIFICATION MISSING 2ND ID

## 2017-10-27 LAB — TIQ-NTM

## 2017-10-28 ENCOUNTER — Other Ambulatory Visit (HOSPITAL_COMMUNITY)
Admission: RE | Admit: 2017-10-28 | Discharge: 2017-10-28 | Disposition: A | Discharge status: Home / Self Care | Payer: Medicare PPO | Source: Ambulatory Visit | Attending: Hematology | Admitting: Hematology

## 2017-10-28 ENCOUNTER — Encounter (HOSPITAL_COMMUNITY): Payer: Self-pay

## 2017-10-28 ENCOUNTER — Telehealth: Payer: Self-pay | Admitting: Hematology

## 2017-10-28 ENCOUNTER — Other Ambulatory Visit: Payer: Self-pay

## 2017-10-28 ENCOUNTER — Encounter: Payer: Self-pay | Admitting: Hematology

## 2017-10-28 ENCOUNTER — Ambulatory Visit (HOSPITAL_COMMUNITY): Payer: Medicare PPO | Admitting: Anesthesiology

## 2017-10-28 ENCOUNTER — Ambulatory Visit (HOSPITAL_COMMUNITY)
Admission: RE | Admit: 2017-10-28 | Discharge: 2017-10-28 | Disposition: A | Discharge status: Home / Self Care | Payer: Medicare PPO | Source: Ambulatory Visit | Attending: Gastroenterology | Admitting: Gastroenterology

## 2017-10-28 ENCOUNTER — Encounter (HOSPITAL_COMMUNITY)
Admission: RE | Disposition: A | Discharge status: Home / Self Care | Payer: Self-pay | Source: Ambulatory Visit | Attending: Gastroenterology

## 2017-10-28 DIAGNOSIS — D509 Iron deficiency anemia, unspecified: Secondary | ICD-10-CM | POA: Diagnosis present

## 2017-10-28 DIAGNOSIS — I10 Essential (primary) hypertension: Secondary | ICD-10-CM | POA: Diagnosis not present

## 2017-10-28 DIAGNOSIS — C160 Malignant neoplasm of cardia: Secondary | ICD-10-CM | POA: Insufficient documentation

## 2017-10-28 DIAGNOSIS — C158 Malignant neoplasm of overlapping sites of esophagus: Secondary | ICD-10-CM | POA: Insufficient documentation

## 2017-10-28 DIAGNOSIS — R634 Abnormal weight loss: Secondary | ICD-10-CM

## 2017-10-28 DIAGNOSIS — Z7982 Long term (current) use of aspirin: Secondary | ICD-10-CM | POA: Insufficient documentation

## 2017-10-28 DIAGNOSIS — F17210 Nicotine dependence, cigarettes, uncomplicated: Secondary | ICD-10-CM | POA: Diagnosis not present

## 2017-10-28 DIAGNOSIS — R1319 Other dysphagia: Secondary | ICD-10-CM

## 2017-10-28 DIAGNOSIS — E46 Unspecified protein-calorie malnutrition: Secondary | ICD-10-CM | POA: Diagnosis not present

## 2017-10-28 DIAGNOSIS — K2289 Other specified disease of esophagus: Secondary | ICD-10-CM

## 2017-10-28 DIAGNOSIS — R1310 Dysphagia, unspecified: Secondary | ICD-10-CM | POA: Diagnosis not present

## 2017-10-28 DIAGNOSIS — C155 Malignant neoplasm of lower third of esophagus: Secondary | ICD-10-CM | POA: Diagnosis not present

## 2017-10-28 DIAGNOSIS — Z79899 Other long term (current) drug therapy: Secondary | ICD-10-CM | POA: Diagnosis not present

## 2017-10-28 DIAGNOSIS — K229 Disease of esophagus, unspecified: Secondary | ICD-10-CM | POA: Diagnosis present

## 2017-10-28 DIAGNOSIS — K228 Other specified diseases of esophagus: Secondary | ICD-10-CM

## 2017-10-28 DIAGNOSIS — J449 Chronic obstructive pulmonary disease, unspecified: Secondary | ICD-10-CM | POA: Diagnosis not present

## 2017-10-28 DIAGNOSIS — K319 Disease of stomach and duodenum, unspecified: Secondary | ICD-10-CM | POA: Diagnosis present

## 2017-10-28 HISTORY — PX: SAVORY DILATION: SHX5439

## 2017-10-28 HISTORY — PX: ESOPHAGOGASTRODUODENOSCOPY (EGD) WITH PROPOFOL: SHX5813

## 2017-10-28 LAB — FECAL GLOBIN BY IMMUNOCHEMISTRY
FECAL GLOBIN RESULT: NOT DETECTED
MICRO NUMBER:: 81283412
SPECIMEN QUALITY:: ADEQUATE

## 2017-10-28 LAB — SPECIMEN ID NOTIFICATION MISSING 2ND ID

## 2017-10-28 SURGERY — ESOPHAGOGASTRODUODENOSCOPY (EGD) WITH PROPOFOL
Anesthesia: Monitor Anesthesia Care

## 2017-10-28 MED ORDER — LACTATED RINGERS IV SOLN
INTRAVENOUS | Status: DC
  Administered 2017-10-28: 10:00:00 via INTRAVENOUS

## 2017-10-28 MED ORDER — LIDOCAINE 2% (20 MG/ML) 5 ML SYRINGE
INTRAMUSCULAR | Status: DC | PRN
  Administered 2017-10-28: 75 mg via INTRAVENOUS

## 2017-10-28 MED ORDER — LIDOCAINE 2% (20 MG/ML) 5 ML SYRINGE
INTRAMUSCULAR | Status: AC
  Filled 2017-10-28: qty 15

## 2017-10-28 MED ORDER — LIDOCAINE 2% (20 MG/ML) 5 ML SYRINGE
INTRAMUSCULAR | Status: AC
  Filled 2017-10-28: qty 5

## 2017-10-28 MED ORDER — SODIUM CHLORIDE 0.9 % IV SOLN: INTRAVENOUS | Status: DC

## 2017-10-28 MED ORDER — PROPOFOL 500 MG/50ML IV EMUL
INTRAVENOUS | Status: DC | PRN
Start: 2017-10-28 — End: 2017-10-28
  Administered 2017-10-28: 100 ug/kg/min via INTRAVENOUS

## 2017-10-28 MED ORDER — PROPOFOL 10 MG/ML IV BOLUS
INTRAVENOUS | Status: DC | PRN
  Administered 2017-10-28 (×2): 20 mg via INTRAVENOUS

## 2017-10-28 MED ORDER — ROCURONIUM BROMIDE 50 MG/5ML IV SOSY
PREFILLED_SYRINGE | INTRAVENOUS | Status: AC
  Filled 2017-10-28: qty 10

## 2017-10-28 MED ORDER — PROPOFOL 10 MG/ML IV BOLUS
INTRAVENOUS | Status: AC
  Filled 2017-10-28: qty 20

## 2017-10-28 SURGICAL SUPPLY — 15 items

## 2017-10-28 NOTE — Anesthesia Preprocedure Evaluation (Signed)
Anesthesia Evaluation  Patient identified by MRN, date of birth, ID band Patient awake    Reviewed: Allergy & Precautions, NPO status , Patient's Chart, lab work & pertinent test results  Airway Mallampati: I  TM Distance: >3 FB Neck ROM: Full    Dental   Pulmonary COPD,  COPD inhaler, former smoker,    Pulmonary exam normal        Cardiovascular hypertension, Pt. on medications Normal cardiovascular exam     Neuro/Psych    GI/Hepatic   Endo/Other    Renal/GU      Musculoskeletal   Abdominal   Peds  Hematology   Anesthesia Other Findings   Reproductive/Obstetrics                             Anesthesia Physical Anesthesia Plan  ASA: III  Anesthesia Plan: MAC   Post-op Pain Management:    Induction: Intravenous  PONV Risk Score and Plan: 1  Airway Management Planned: Simple Face Mask  Additional Equipment:   Intra-op Plan:   Post-operative Plan:   Informed Consent: I have reviewed the patients History and Physical, chart, labs and discussed the procedure including the risks, benefits and alternatives for the proposed anesthesia with the patient or authorized representative who has indicated his/her understanding and acceptance.     Plan Discussed with: CRNA and Surgeon  Anesthesia Plan Comments:         Anesthesia Quick Evaluation

## 2017-10-28 NOTE — Telephone Encounter (Signed)
Appt has been scheduled for the pt to see Dr. Burr Medico on 11/28 at 230pm. When I spoke to the pt, he seemed confused about he reason for the referral. Messaged Amanda at [facility] to notify the pt. Letter mailed.

## 2017-10-28 NOTE — Transfer of Care (Signed)
Immediate Anesthesia Transfer of Care Note  Patient: William Villegas  Procedure(s) Performed: ESOPHAGOGASTRODUODENOSCOPY (EGD) WITH PROPOFOL (N/A ) POSSIBLE SAVORY DILATION (N/A )  Patient Location: PACU  Anesthesia Type:General  Level of Consciousness: awake, alert  and oriented  Airway & Oxygen Therapy: Patient Spontanous Breathing and Patient connected to nasal cannula oxygen  Post-op Assessment: Report given to RN  Post vital signs: Reviewed and stable  Last Vitals:  Vitals:   10/28/17 1022  BP: (!) 144/67  Pulse: 95  Resp: 18  Temp: 36.4 C  SpO2: 94%    Last Pain:  Vitals:   10/28/17 1022  TempSrc: Oral         Complications: No apparent anesthesia complications

## 2017-10-28 NOTE — Interval H&P Note (Signed)
History and Physical Interval Note:  10/28/2017 10:38 AM  William Villegas  has presented today for surgery, with the diagnosis of Severe dysphagia, weight loss, Iron deficiency anemia  The various methods of treatment have been discussed with the patient and family. After consideration of risks, benefits and other options for treatment, the patient has consented to  Procedure(s): ESOPHAGOGASTRODUODENOSCOPY (EGD) WITH PROPOFOL (N/A) POSSIBLE SAVORY DILATION (N/A) as a surgical intervention .  The patient's history has been reviewed, patient examined, no change in status, stable for surgery.  I have reviewed the patient's chart and labs.  Questions were answered to the patient's satisfaction.     Pricilla Riffle. Fuller Plan

## 2017-10-28 NOTE — Op Note (Signed)
[facility] Patient Name: William Villegas Procedure Date: 10/28/2017 MRN: [id number] Attending MD: Ladene Artist , MD Date of Birth: [birth year] CSN: [id number] Age: 71 Admit Type: Outpatient Procedure:                Upper GI endoscopy Indications:              Iron deficiency anemia, Dysphagia, Weight loss Providers:                Pricilla Riffle. Fuller Plan, MD, Cleda Daub, RN, Elspeth Cho Tech., Technician, [address]. Zenia Resides CRNA,                            CRNA Referring MD:             Cammie Mcgee. Dennard Schaumann, MD Medicines:                Monitored Anesthesia Care Complications:            No immediate complications. Estimated Blood Loss:     Estimated blood loss was minimal. Procedure:                Pre-Anesthesia Assessment:                           - Prior to the procedure, a History and Physical                            was performed, and patient medications and                            allergies were reviewed. The patient's tolerance of                            previous anesthesia was also reviewed. The risks                            and benefits of the procedure and the sedation                            options and risks were discussed with the patient.                            All questions were answered, and informed consent                            was obtained. Prior Anticoagulants: The patient has                            taken no previous anticoagulant or antiplatelet                            agents. ASA Grade Assessment: III - A patient with  severe systemic disease. After reviewing the risks                            and benefits, the patient was deemed in                            satisfactory condition to undergo the procedure.                           After obtaining informed consent, the endoscope was                            passed under direct vision. Throughout the                   procedure, the patient's blood pressure, pulse, and                            oxygen saturations were monitored continuously. The                            [id number] [phone]) scope was introduced through the                            mouth, and advanced to the second part of duodenum.                            The upper GI endoscopy was accomplished without                            difficulty. The patient tolerated the procedure                            well. Scope In: Scope Out: Findings:      A medium-sized, submucosal mass with no bleeding and no stigmata of       recent bleeding was found in the distal esophagus and at the       gastroesophageal junction, extending from 38-41 cm from the incisors.       The mass was partially obstructing and partially circumferential       (involving one-half of the lumen circumference). This was contiguous       with the gastric cardia mass Biopsies were taken with a cold forceps for       histology.      The exam of the esophagus was otherwise normal.      A fungating, ulcerated, friable non-circumferential mass with no       bleeding and no stigmata of recent bleeding was found in the cardia.       Mass was 4 cm in size. Biopsies were taken with a cold forceps for       histology.      The exam of the stomach was otherwise normal.      The duodenal bulb and second portion of the duodenum were normal. 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 Biopsied.                           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                            Biopsied.                           - Normal duodenal bulb and second portion of the                            duodenum. Moderate Sedation:      N/A- Per  Anesthesia Care Recommendation:           - Patient has a contact number available for                            emergencies. The signs and symptoms of potential                            delayed complications were discussed with the                            patient. Return to normal activities tomorrow.                            Written discharge instructions were provided to the                            patient.                           - Resume previous diet: soft foods and liquids.                           - Continue present medications.                           - Await pathology results.                           - Perform a CT scan (computed tomography) of chest                            with contrast, abdomen with contrast and pelvis                            with contrast at the next available appointment.                           -  Oncology referral. Procedure Code(s):        --- Professional ---                           [phone], Esophagogastroduodenoscopy, flexible,                            transoral; with biopsy, single or multiple Diagnosis Code(s):        --- Professional ---                           D49.0, Neoplasm of unspecified behavior of                            digestive system                           C16.0, Malignant neoplasm of cardia                           D50.9, Iron deficiency anemia, unspecified                           R13.10, Dysphagia, unspecified                           R63.4, Abnormal weight loss CPT copyright 2016 [employer]. All rights reserved. The codes documented in this report are preliminary and upon coder review may  be revised to meet current compliance requirements. Ladene Artist, MD 10/28/2017 12:20:26 PM This report has been signed electronically. Number of Addenda: 0

## 2017-10-28 NOTE — Discharge Instructions (Signed)
YOU HAD AN ENDOSCOPIC PROCEDURE TODAY: Refer to the procedure report and other information in the discharge instructions given to you for any specific questions about what was found during the examination. If this information does not answer your questions, please call [place] office at [phone] to clarify.  ° °YOU SHOULD EXPECT: Some feelings of bloating in the abdomen. Passage of more gas than usual. Walking can help get rid of the air that was put into your GI tract during the procedure and reduce the bloating. If you had a lower endoscopy (such as a colonoscopy or flexible sigmoidoscopy) you may notice spotting of blood in your stool or on the toilet paper. Some abdominal soreness may be present for a day or two, also. ° °DIET: Your first meal following the procedure should be a light meal and then it is ok to progress to your normal diet. A half-sandwich or bowl of soup is an example of a good first meal. Heavy or fried foods are harder to digest and may make you feel nauseous or bloated. Drink plenty of fluids but you should avoid alcoholic beverages for 24 hours. If you had a esophageal dilation, please see attached instructions for diet.   ° °ACTIVITY: Your care partner should take you home directly after the procedure. You should plan to take it easy, moving slowly for the rest of the day. You can resume normal activity the day after the procedure however YOU SHOULD NOT DRIVE, use power tools, machinery or perform tasks that involve climbing or major physical exertion for 24 hours (because of the sedation medicines used during the test).  ° °SYMPTOMS TO REPORT IMMEDIATELY: °A gastroenterologist can be reached at any hour. Please call [phone]  for any of the following symptoms:  °Following lower endoscopy (colonoscopy, flexible sigmoidoscopy) °Excessive amounts of blood in the stool  °Significant tenderness, worsening of abdominal pains  °Swelling of the abdomen that is new, acute  °Fever of 100° or  higher  °Following upper endoscopy (EGD, EUS, ERCP, esophageal dilation) °Vomiting of blood or coffee ground material  °New, significant abdominal pain  °New, significant chest pain or pain under the shoulder blades  °Painful or persistently difficult swallowing  °New shortness of breath  °Black, tarry-looking or red, bloody stools ° °FOLLOW UP:  °If any biopsies were taken you will be contacted by phone or by letter within the next 1-3 weeks. Call [phone]  if you have not heard about the biopsies in 3 weeks.  °Please also call with any specific questions about appointments or follow up tests. ° °

## 2017-10-28 NOTE — Anesthesia Postprocedure Evaluation (Signed)
Anesthesia Post Note  Patient: MAYUR DUMAN  Procedure(s) Performed: ESOPHAGOGASTRODUODENOSCOPY (EGD) WITH PROPOFOL (N/A ) POSSIBLE SAVORY DILATION (N/A )     Patient location during evaluation: PACU Anesthesia Type: MAC Level of consciousness: awake and alert Pain management: pain level controlled Vital Signs Assessment: post-procedure vital signs reviewed and stable Respiratory status: spontaneous breathing, nonlabored ventilation, respiratory function stable and patient connected to nasal cannula oxygen Cardiovascular status: blood pressure returned to baseline and stable Postop Assessment: no apparent nausea or vomiting Anesthetic complications: no    Last Vitals:  Vitals:   10/28/17 1230 10/28/17 1240  BP: 134/67 (!) 144/72  Pulse: 77 73  Resp: 18 17  Temp:    SpO2: 97% 97%    Last Pain:  Vitals:   10/28/17 1220  TempSrc: Oral                 Brittnae Aschenbrenner DAVID

## 2017-10-29 ENCOUNTER — Encounter (HOSPITAL_COMMUNITY): Payer: Self-pay | Admitting: Gastroenterology

## 2017-11-03 ENCOUNTER — Ambulatory Visit (INDEPENDENT_AMBULATORY_CARE_PROVIDER_SITE_OTHER)
Admission: RE | Admit: 2017-11-03 | Discharge: 2017-11-03 | Disposition: A | Discharge status: Home / Self Care | Payer: Medicare PPO | Source: Ambulatory Visit | Attending: Gastroenterology | Admitting: Gastroenterology

## 2017-11-03 DIAGNOSIS — K229 Disease of esophagus, unspecified: Secondary | ICD-10-CM

## 2017-11-03 DIAGNOSIS — K228 Other specified diseases of esophagus: Secondary | ICD-10-CM

## 2017-11-03 DIAGNOSIS — K2289 Other specified disease of esophagus: Secondary | ICD-10-CM

## 2017-11-03 MED ORDER — IOPAMIDOL (ISOVUE-300) INJECTION 61%
100.0000 mL | Freq: Once | INTRAVENOUS | Status: AC | PRN
  Administered 2017-11-03: 100 mL via INTRAVENOUS

## 2017-11-05 ENCOUNTER — Encounter: Payer: Self-pay | Admitting: Nurse Practitioner

## 2017-11-05 ENCOUNTER — Ambulatory Visit (HOSPITAL_BASED_OUTPATIENT_CLINIC_OR_DEPARTMENT_OTHER): Payer: Medicare PPO | Admitting: Nurse Practitioner

## 2017-11-05 ENCOUNTER — Ambulatory Visit: Payer: Medicare PPO

## 2017-11-05 ENCOUNTER — Telehealth: Payer: Self-pay

## 2017-11-05 ENCOUNTER — Telehealth: Payer: Self-pay | Admitting: Hematology

## 2017-11-05 VITALS — BP 146/80 | HR 100 | Temp 97.7°F | Resp 19 | Ht 69.0 in | Wt 124.9 lb

## 2017-11-05 DIAGNOSIS — C787 Secondary malignant neoplasm of liver and intrahepatic bile duct: Secondary | ICD-10-CM | POA: Diagnosis not present

## 2017-11-05 DIAGNOSIS — C159 Malignant neoplasm of esophagus, unspecified: Secondary | ICD-10-CM

## 2017-11-05 DIAGNOSIS — J449 Chronic obstructive pulmonary disease, unspecified: Secondary | ICD-10-CM

## 2017-11-05 DIAGNOSIS — C160 Malignant neoplasm of cardia: Secondary | ICD-10-CM

## 2017-11-05 DIAGNOSIS — E279 Disorder of adrenal gland, unspecified: Secondary | ICD-10-CM | POA: Diagnosis not present

## 2017-11-05 DIAGNOSIS — R1310 Dysphagia, unspecified: Secondary | ICD-10-CM

## 2017-11-05 DIAGNOSIS — R634 Abnormal weight loss: Secondary | ICD-10-CM | POA: Diagnosis not present

## 2017-11-05 DIAGNOSIS — D509 Iron deficiency anemia, unspecified: Secondary | ICD-10-CM

## 2017-11-05 DIAGNOSIS — I10 Essential (primary) hypertension: Secondary | ICD-10-CM

## 2017-11-05 MED ORDER — SULFAMETHOXAZOLE-TRIMETHOPRIM 800-160 MG PO TABS: 1.0000 | ORAL_TABLET | Freq: Two times a day (BID) | ORAL | 0 refills | Status: DC

## 2017-11-05 MED ORDER — PROCHLORPERAZINE MALEATE 10 MG PO TABS: 10.0000 mg | ORAL_TABLET | Freq: Four times a day (QID) | ORAL | 0 refills | Status: AC | PRN

## 2017-11-05 NOTE — Progress Notes (Signed)
  Oncology Nurse Navigator Documentation  Navigator Location: [facility] (11/05/17 1453)   )Navigator Encounter Type: Initial MedOnc (11/05/17 1453)   Abnormal Finding Date: 10/28/17 (11/05/17 1453) Confirmed Diagnosis Date: 10/28/17 (11/05/17 1453)               Patient Visit Type: MedOnc;Initial (11/05/17 1453)   Barriers/Navigation Needs: Education (11/05/17 1453) Education: Understanding Cancer/ Treatment Options;Newly Diagnosed Cancer Education (11/05/17 1453) Interventions: Psycho-social support (11/05/17 1453)  I met with patient and wife at initial med/onc appointment to introduce myself and my role on his treatment team. Patient verbalized understanding that he can call me with questions or concerns. Patient has my contact information.          Acuity: Level 2 (11/05/17 1453)   Acuity Level 2: Initial guidance, education and coordination as needed;Ongoing guidance and education throughout treatment as needed (11/05/17 1453)     Time Spent with Patient: 15 (11/05/17 1453)

## 2017-11-05 NOTE — Telephone Encounter (Signed)
Lab added on 11/28 RN. request

## 2017-11-05 NOTE — Telephone Encounter (Signed)
Gave avs and calendar for December however had to email [facility]

## 2017-11-05 NOTE — Progress Notes (Addendum)
[place]  [phone] [phone]  Clinic New Consult Note   Patient Care Team: Susy Frizzle, MD as PCP - General (Family Medicine) 11/05/2017  REFERRAL PHYSICIAN: Dr. Fuller Plan  CHIEF COMPLAINTS/PURPOSE OF CONSULTATION:  Gastroesophageal carcinoma  HISTORY OF PRESENTING ILLNESS:  William Villegas 71 y.o. male is here because of recent diagnosis of gastroesophageal carcinoma.  He initially presented with dysphagia and weight loss.  He was found to have iron deficiency anemia.  He underwent an upper endoscopy on 10/28/2017 with findings of a partially obstructing likely malignant submucosal esophageal tumor at the gastroesophageal junction contiguous with a gastric cardia mass.  Biopsy of both the esophagus and gastric cardia showed poorly differentiated carcinoma.  CT scans 11/03/2017 showed a 4 x 6 cm mass at the gastroesophageal junction, diffuse hepatic metastases, mild paraesophageal and gastrohepatic ligament adenopathy and a new 1.8 cm right adrenal mass.  MEDICAL HISTORY:  Past Medical History:  Diagnosis Date  . COPD (chronic obstructive pulmonary disease) ([place])   . Hypertension     SURGICAL HISTORY: Past Surgical History:  Procedure Laterality Date  . ESOPHAGOGASTRODUODENOSCOPY (EGD) WITH PROPOFOL N/A 10/28/2017   Procedure: ESOPHAGOGASTRODUODENOSCOPY (EGD) WITH PROPOFOL;  Surgeon: Ladene Artist, MD;  Location: [facility];  Service: Endoscopy;  Laterality: N/A;  . SAVORY DILATION N/A 10/28/2017   Procedure: POSSIBLE SAVORY DILATION;  Surgeon: Ladene Artist, MD;  Location: [facility];  Service: Endoscopy;  Laterality: N/A;    SOCIAL HISTORY: Social History   Socioeconomic History  . Marital status: Divorced    Spouse name: Not on file  . Number of children: Not on file  . Years of education: Not on file  . Highest education level: Not on file  Social Needs  . Financial resource strain: Not on file  . Food  insecurity - worry: Not on file  . Food insecurity - inability: Not on file  . Transportation needs - medical: Not on file  . Transportation needs - non-medical: Not on file  Occupational History  . Not on file  Tobacco Use  . Smoking status: Former Smoker    Packs/day: 0.50    Years: 25.00    Pack years: 12.50    Types: Cigarettes    Last attempt to quit: 11/08/2016    Years since quitting: 0.9  . Smokeless tobacco: Never Used  Substance and Sexual Activity  . Alcohol use: No  . Drug use: No  . Sexual activity: Not on file  Other Topics Concern  . Not on file  Social History Narrative  . Not on file    FAMILY HISTORY: Sister deceased with "cancer", type unknown.  ALLERGIES:  has No Known Allergies.  MEDICATIONS:  Current Outpatient Medications  Medication Sig Dispense Refill  . aspirin 81 MG tablet Take 81 mg by mouth daily.    . OXYGEN Inhale 2 L as needed into the lungs (for shortness of breath).     . pantoprazole (PROTONIX) 40 MG tablet Take 40 mg 2 (two) times daily by mouth.  3  . umeclidinium-vilanterol (ANORO ELLIPTA) 62.5-25 MCG/INH AEPB Inhale 1 puff into the lungs daily. 30 each 0  . prochlorperazine (COMPAZINE) 10 MG tablet Take 1 tablet (10 mg total) by mouth every 6 (six) hours as needed for nausea or vomiting. 30 tablet 0  . sulfamethoxazole-trimethoprim (BACTRIM DS,SEPTRA DS) 800-160 MG tablet Take 1 tablet by mouth 2 (two) times daily. 20 tablet 0   No current facility-administered medications for this visit.  REVIEW OF SYSTEMS:   Constitutional: Denies fevers, chills or abnormal night sweats.  Estimates 30 pound weight loss over the past 4 months. Eyes: Denies blurriness of vision, double vision or watery eyes Ears, nose, mouth, throat, and face: Denies mucositis or sore throat Respiratory: Denies cough, dyspnea or wheezes Cardiovascular: Denies palpitation, chest discomfort or lower extremity swelling Gastrointestinal: 4-month history of  dysphagia.  No odynophagia.  He is tolerating liquids and soft solids.  Denies nausea, heartburn or change in bowel habits Skin: Denies abnormal skin rashes; notes that he bruises easily. Lymphatics: Denies new lymphadenopathy or easy bruising Neurological:Denies numbness, tingling or new weaknesses Behavioral/Psych: Mood is stable, no new changes  All other systems were reviewed with the patient and are negative.  PHYSICAL EXAMINATION: ECOG PERFORMANCE STATUS: 1  Vitals:   11/05/17 1428 11/05/17 1500  BP: (!) 146/80   Pulse: (!) 117 100  Resp: 19   Temp: 97.7 F (36.5 C)   SpO2: 98%    Filed Weights   11/05/17 1428  Weight: 124 lb 14.4 oz (56.7 kg)    GENERAL:alert, no distress and comfortable SKIN: skin color, texture, turgor are normal, no rashes or significant lesions EYES: normal, conjunctiva are pink and non-injected, sclera clear OROPHARYNX:no exudate, no erythema and lips, buccal mucosa, and tongue normal  NECK: supple, thyroid normal size, non-tender, without nodularity LYMPH:  no palpable lymphadenopathy in the cervical, axillary or inguinal LUNGS: clear to auscultation and percussion with normal breathing effort HEART: regular rate & rhythm and no murmurs and no lower extremity edema ABDOMEN:abdomen soft, non-tender and normal bowel sounds Musculoskeletal:no cyanosis of digits and no clubbing  PSYCH: alert & oriented x 3 with fluent speech NEURO: no focal motor/sensory deficits  LABORATORY DATA:  I have reviewed the data as listed CBC Latest Ref Rng & Units 10/10/2017 12/10/2016 12/09/2016  WBC 3.8 - 10.8 Thousand/uL 8.5 10.6(H) 7.8  Hemoglobin 13.2 - 17.1 g/dL 8.7(L) 10.5(L) 10.5(L)  Hematocrit 38.5 - 50.0 % 26.9(L) 32.9(L) 32.3(L)  Platelets 140 - 400 Thousand/uL 395 212 203   PATHOLOGY REPORT  Diagnosis 1. Stomach, biopsy, gastric cardia mass - POORLY DIFFERENTIATED CARCINOMA, SEE COMMENT. 2. Esophagus, biopsy, distal - POORLY DIFFERENTIATED  CARCINOMA. Microscopic Comment 1. , 2. Both biopsies show similar features although tumor fragments are more abundant in specimen #1. The appearance favors adenocarcinoma but it is unclear if this is arising in stomach or esophagus based on this material. Dr Patrick reviewed this case and concurs. The results were discussed with Dr Stark on 10/29/2017. (BNS:ecj 10/29/2017)  RADIOGRAPHIC STUDIES: I have personally reviewed the radiological images as listed and agreed with the findings in the report. Dg Chest 2 View  Result Date: 10/13/2017 CLINICAL DATA:  71-year-old male with unintentional weight loss. Loss of appetite. EXAM: CHEST  2 VIEW COMPARISON:  CTA chest 12/09/2016 and earlier. FINDINGS: Chronic hyperinflation with bullous emphysema in the left lung. Mediastinal contours are stable and normal aside from mild tortuosity of the aorta. No pneumothorax, pulmonary edema, pleural effusion or confluent pulmonary opacity. Negative visible bowel gas pattern. No acute osseous abnormality identified. IMPRESSION: Emphysema (ICD10-J43.9). No acute cardiopulmonary abnormality. Electronically Signed   By: H  Hall M.D.   On: 10/13/2017 06:44   Ct Chest W Contrast  Result Date: 11/03/2017 CLINICAL DATA:  Dysphagia and vomiting for several months. 30 pound weight loss. Distal esophageal mass on recent endoscopy. EXAM: CT CHEST, ABDOMEN, AND PELVIS WITH CONTRAST TECHNIQUE: Multidetector CT imaging of the chest, abdomen and pelvis was   performed following the standard protocol during bolus administration of intravenous contrast. CONTRAST:  100mL ISOVUE-300 IOPAMIDOL (ISOVUE-300) INJECTION 61% COMPARISON:  Chest CT only on 12/09/2016 FINDINGS: CT CHEST FINDINGS Cardiovascular: No acute findings. Aortic and coronary artery atherosclerosis. Mediastinum/Lymph Nodes: 1.7 cm lymph node is seen in the inferior aspect of the middle mediastinum along the left lateral wall of the distal esophagus, consistent with lymph  node metastasis. No other pathologically enlarged lymph nodes identified within the thorax. Lungs/Pleura: Moderate to severe pulmonary emphysema is seen. Right lower lobe pleural-parenchymal scarring and mild traction bronchiectasis again demonstrated. No suspicious pulmonary nodules or masses identified . No evidence of pulmonary infiltrate or pleural effusion. Musculoskeletal:  No suspicious bone lesions identified. CT ABDOMEN AND PELVIS FINDINGS Hepatobiliary: Multiple hypovascular liver masses are seen in both the right and left hepatic lobes. Index lesion in the central right hepatic lobe measures 6.2 x 5.3 cm on image 62/2. Gallbladder is unremarkable. No evidence of biliary ductal dilatation. Pancreas:  No mass or inflammatory changes. Spleen:  Within normal limits in size and appearance. Adrenals/Urinary tract: 1.8 x 1.4 cm right adrenal mass is new since previous study and highly suspicious for adrenal metastasis. Small right renal cysts are noted, however there is no evidence of renal masses or hydronephrosis. Unremarkable unopacified urinary bladder. Stomach/Bowel: Irregular soft tissue mass is seen at the gastroesophageal junction measuring 5.7 x 4.1 cm, consistent with gastroesophageal carcinoma. Sigmoid diverticulosis is noted, however there is no evidence of diverticulitis . Vascular/Lymphatic: Mild lymphadenopathy in the gastrohepatic ligament measuring 1.2 cm on image 62/2, consistent with metastatic disease. No other pathologically enlarged lymph nodes identified within the abdomen or pelvis. Reproductive:  No mass or other significant abnormality identified. Other:  None. Musculoskeletal:  No suspicious bone lesions identified. IMPRESSION: 4 x 6 cm mass at gastroesophageal junction, consistent with carcinoma. Diffuse hepatic metastases. Mild paraesophageal and gastrohepatic ligament adenopathy, consistent with metastatic disease. 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  Aortic Atherosclerosis (ICD10-I70.0) and Emphysema (ICD10-J43.9). Coronary artery calcification. Electronically Signed   By: John  Stahl M.D.   On: 11/03/2017 16:33   Ct Abdomen Pelvis W Contrast  Result Date: 11/03/2017 CLINICAL DATA:  Dysphagia and vomiting for several months. 30 pound weight loss. Distal esophageal mass on recent endoscopy. EXAM: CT CHEST, ABDOMEN, AND PELVIS WITH CONTRAST TECHNIQUE: Multidetector CT imaging of the chest, abdomen and pelvis was performed following the standard protocol during bolus administration of intravenous contrast. CONTRAST:  100mL ISOVUE-300 IOPAMIDOL (ISOVUE-300) INJECTION 61% COMPARISON:  Chest CT only on 12/09/2016 FINDINGS: CT CHEST FINDINGS Cardiovascular: No acute findings. Aortic and coronary artery atherosclerosis. Mediastinum/Lymph Nodes: 1.7 cm lymph node is seen in the inferior aspect of the middle mediastinum along the left lateral wall of the distal esophagus, consistent with lymph node metastasis. No other pathologically enlarged lymph nodes identified within the thorax. Lungs/Pleura: Moderate to severe pulmonary emphysema is seen. Right lower lobe pleural-parenchymal scarring and mild traction bronchiectasis again demonstrated. No suspicious pulmonary nodules or masses identified . No evidence of pulmonary infiltrate or pleural effusion. Musculoskeletal:  No suspicious bone lesions identified. CT ABDOMEN AND PELVIS FINDINGS Hepatobiliary: Multiple hypovascular liver masses are seen in both the right and left hepatic lobes. Index lesion in the central right hepatic lobe measures 6.2 x 5.3 cm on image 62/2. Gallbladder is unremarkable. No evidence of biliary ductal dilatation. Pancreas:  No mass or inflammatory changes. Spleen:  Within normal limits in size and appearance. Adrenals/Urinary tract: 1.8 x 1.4 cm   right adrenal mass is new since previous study and highly suspicious for adrenal metastasis. Small right renal cysts are noted, however there is no  evidence of renal masses or hydronephrosis. Unremarkable unopacified urinary bladder. Stomach/Bowel: Irregular soft tissue mass is seen at the gastroesophageal junction measuring 5.7 x 4.1 cm, consistent with gastroesophageal carcinoma. Sigmoid diverticulosis is noted, however there is no evidence of diverticulitis . Vascular/Lymphatic: Mild lymphadenopathy in the gastrohepatic ligament measuring 1.2 cm on image 62/2, consistent with metastatic disease. No other pathologically enlarged lymph nodes identified within the abdomen or pelvis. Reproductive:  No mass or other significant abnormality identified. Other:  None. Musculoskeletal:  No suspicious bone lesions identified. IMPRESSION: 4 x 6 cm mass at gastroesophageal junction, consistent with carcinoma. Diffuse hepatic metastases. Mild paraesophageal and gastrohepatic ligament adenopathy, consistent with metastatic disease. 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 Aortic Atherosclerosis (ICD10-I70.0) and Emphysema (ICD10-J43.9). Coronary artery calcification. Electronically Signed   By: Earle Gell M.D.   On: 11/03/2017 16:33    ASSESSMENT & PLAN:  1. Gastroesophageal junction adenocarcinoma   Upper endoscopy 10/28/2017 with findings of a partially obstructing likely malignant submucosal esophageal tumor at the gastroesophageal junction contiguous with a gastric cardia mass.  Biopsy of both the esophagus and gastric cardia showed poorly differentiated carcinoma  CT scans chest/abdomen/pelvis 11/03/2017 with a 4 x 6 cm mass at the gastroesophageal junction; diffuse hepatic metastases; mild paraesophageal and gastrohepatic ligament adenopathy; by 1.8 cm right adrenal mass 2. Dysphagia secondary to #1 3. Weight loss secondary to #1 4. COPD 5. Skin abscess mid back  Disposition: Mr. Marte has been diagnosed with metastatic gastroesophageal carcinoma.  Dr. Burr Medico reviewed the diagnosis, prognosis and treatment options.  He  understands that no therapy will be curative.  Dr. Burr Medico recommends systemic therapy on the FOLFOX regimen.  We reviewed potential toxicities associated with chemotherapy including bone marrow toxicity, nausea, hair loss, allergic reaction.  We discussed potential toxicities associated with 5 fluorouracil including mouth sores, diarrhea, skin hyperpigmentation, skin rash, increased sensitivity to sun, hand-foot syndrome, conjunctivitis.  We reviewed the neurotoxicity associated with Oxaliplatin.  He agrees to proceed.  He will attend a chemotherapy education class.  We will contact pathology to request PD1 and MSI testing.   We are referring him for a liver biopsy.  We are also referring him for placement of a Port-A-Cath.  He has lost a significant amount of weight.  He will increase intake of nutritional supplements.  We made a referral to the [place] dietitian.  He has iron deficiency anemia.  Dr. Burr Medico recommends IV iron.  We reviewed potential toxicities including a severe allergic reaction.  He agrees to proceed.  He will receive Feraheme weekly x2.  On exam he appears to have a skin abscess at the mid back.  We obtained a culture.  He will begin a 10-day course of Septra DS.  He will return for a follow-up visit and cycle 1 FOLFOX the week of 11/17/2017.    Orders Placed This Encounter  Procedures  . Culture, Wound    Standing Status:   Future    Number of Occurrences:   1    Standing Expiration Date:   11/05/2018    Order Specific Question:   Source    Answer:   back abscess  . US BIOPSY (LIVER)    Standing Status:   Future    Standing Expiration Date:   01/05/2019    Order Specific Question:  Lab orders requested (DO NOT place separate lab orders, these will be automatically ordered during procedure specimen collection):    Answer:   Surgical Pathology    Order Specific Question:   Reason for Exam (SYMPTOM  OR DIAGNOSIS REQUIRED)    Answer:   us guided biopsy liver lesion;  GE junction cancer    Order Specific Question:   Preferred imaging location?    Answer:   [facility]  . IR Fluoro Guide CV Line Right    portacath for chemo    Standing Status:   Future    Standing Expiration Date:   01/05/2019    Order Specific Question:   Reason for exam:    Answer:   portacath for chemo; new diagnosis GE junction cancer    Order Specific Question:   Preferred Imaging Location?    Answer:   [facility]  . Ambulatory referral to Nutrition and Diabetic Education    Referral Priority:   Routine    Referral Type:   Consultation    Referral Reason:   Specialty Services Required    Number of Visits Requested:   1    Patient seen with Dr. Feng. All questions were answered. The patient knows to call the clinic with any problems, questions or concerns.    Thomas, Lisa, NP 11/05/2017 3:53 PM  Addendum  I have seen the patient, examined him. I agree with the assessment and and plan and have edited the notes.   Mr Stratmann is a 71 yo male with past medical history of COPD, not on oxygen, hypertension, presented with progressive dysphagia and 30 pound weight loss over the past 4 months.  Labs reviewed iron deficient anemia.  EGD biopsy confirmed GE junction adenocarcinoma.  CT scan findings reviewed with patient and his ex-wife, which unfortunately showed diffuse liver metastasis.  I recommend IR liver biopsy to confirm metastasis, port placement for chemotherapy.  Discussed treatment options, including systemic chemotherapy, immunotherapy if his tumor has MSI high or high PDL 1 expression, palliative radiation, and supportive care.  We discussed the goal of care is palliative, to prolong his life and improve his quality of life, but not curative.  Patient voiced good understanding, agrees to proceed with chemo.  I recommend FOLFOX as first-line therapy. Will request MSI-PCR, PD-L1 and her2 tests on his tumor biopsy. Plan to start chemo in 2 weeks. Need nutrition  consult.  He is tolerating liquids well, we also discussed the role of palliative radiation and feeding tube placement if his dysphagia progresses.  Feng, Yan  11/05/2017  

## 2017-11-06 ENCOUNTER — Encounter: Payer: Self-pay | Admitting: Nurse Practitioner

## 2017-11-06 NOTE — Progress Notes (Signed)
START ON PATHWAY REGIMEN - Gastroesophageal     A cycle is every 14 days:     Oxaliplatin      Leucovorin      5-Fluorouracil      5-Fluorouracil   **Always confirm dose/schedule in your pharmacy ordering system**    Patient Characteristics: Distant Metastases (cM1/pM1) / Locally Recurrent Disease, Adenocarcinoma - Esophageal, GE Junction, and Gastric, First Line, HER2 Negative / Unknown Histology: Adenocarcinoma Disease Classification: GE Junction Therapeutic Status: Distant Metastases (No Additional Staging) Would you be surprised if this patient died  in the next year<= I would NOT be surprised if this patient died in the next year Line of Therapy: First Line HER2 Status: Awaiting Test Results Intent of Therapy: Non-Curative / Palliative Intent, Discussed with Patient

## 2017-11-06 NOTE — Addendum Note (Signed)
Addended by: Truitt Merle on: 11/06/2017 07:48 AM   Modules accepted: Orders

## 2017-11-09 LAB — WOUND CULTURE: ORGANISM ID, BACTERIA: NONE SEEN

## 2017-11-10 ENCOUNTER — Telehealth: Payer: Self-pay | Admitting: Hematology

## 2017-11-10 ENCOUNTER — Other Ambulatory Visit: Payer: Self-pay | Admitting: Radiology

## 2017-11-10 NOTE — Telephone Encounter (Signed)
Called patient regarding schedule advised to pick up on next appointment

## 2017-11-11 ENCOUNTER — Other Ambulatory Visit: Payer: Self-pay | Admitting: Hematology

## 2017-11-11 ENCOUNTER — Telehealth: Payer: Self-pay | Admitting: Hematology

## 2017-11-11 ENCOUNTER — Other Ambulatory Visit: Payer: Medicare PPO

## 2017-11-11 ENCOUNTER — Encounter: Payer: Self-pay | Admitting: *Deleted

## 2017-11-11 NOTE — Telephone Encounter (Signed)
Member is currently not eligible for service due to their eligibility. Please contact [place] Member Id: [id number] Payer: [place] Plan Name: [id number] [phone] Effective From: 12/09/2016 (PST) Effective To: 01/08/2017 (PST)  Patient is coming in for chemo on 11/14/2017. I left a message for patient to call me back to obtain insurance information as to obtain authorization for chemo therapy.

## 2017-11-13 ENCOUNTER — Ambulatory Visit (HOSPITAL_COMMUNITY): Payer: Medicare PPO

## 2017-11-13 ENCOUNTER — Other Ambulatory Visit: Payer: Self-pay | Admitting: General Surgery

## 2017-11-13 ENCOUNTER — Other Ambulatory Visit: Payer: Self-pay | Admitting: Radiology

## 2017-11-14 ENCOUNTER — Ambulatory Visit (HOSPITAL_COMMUNITY)
Admission: RE | Admit: 2017-11-14 | Discharge: 2017-11-14 | Disposition: A | Discharge status: Home / Self Care | Payer: Medicare PPO | Source: Ambulatory Visit | Attending: Nurse Practitioner | Admitting: Nurse Practitioner

## 2017-11-14 ENCOUNTER — Ambulatory Visit (HOSPITAL_BASED_OUTPATIENT_CLINIC_OR_DEPARTMENT_OTHER): Payer: Medicare PPO

## 2017-11-14 ENCOUNTER — Encounter: Payer: Self-pay | Admitting: Hematology

## 2017-11-14 ENCOUNTER — Encounter (HOSPITAL_COMMUNITY): Payer: Self-pay

## 2017-11-14 ENCOUNTER — Other Ambulatory Visit: Payer: Self-pay | Admitting: Nurse Practitioner

## 2017-11-14 VITALS — BP 119/53 | HR 95 | Temp 99.8°F | Resp 17

## 2017-11-14 DIAGNOSIS — C159 Malignant neoplasm of esophagus, unspecified: Secondary | ICD-10-CM | POA: Insufficient documentation

## 2017-11-14 DIAGNOSIS — J449 Chronic obstructive pulmonary disease, unspecified: Secondary | ICD-10-CM | POA: Diagnosis not present

## 2017-11-14 DIAGNOSIS — I10 Essential (primary) hypertension: Secondary | ICD-10-CM | POA: Insufficient documentation

## 2017-11-14 DIAGNOSIS — C787 Secondary malignant neoplasm of liver and intrahepatic bile duct: Secondary | ICD-10-CM | POA: Insufficient documentation

## 2017-11-14 DIAGNOSIS — Z87891 Personal history of nicotine dependence: Secondary | ICD-10-CM | POA: Insufficient documentation

## 2017-11-14 DIAGNOSIS — K769 Liver disease, unspecified: Secondary | ICD-10-CM | POA: Diagnosis not present

## 2017-11-14 DIAGNOSIS — D509 Iron deficiency anemia, unspecified: Secondary | ICD-10-CM

## 2017-11-14 DIAGNOSIS — R1310 Dysphagia, unspecified: Secondary | ICD-10-CM | POA: Diagnosis not present

## 2017-11-14 DIAGNOSIS — Z79899 Other long term (current) drug therapy: Secondary | ICD-10-CM | POA: Insufficient documentation

## 2017-11-14 DIAGNOSIS — D500 Iron deficiency anemia secondary to blood loss (chronic): Secondary | ICD-10-CM

## 2017-11-14 HISTORY — PX: IR FLUORO GUIDE PORT INSERTION RIGHT: IMG5741

## 2017-11-14 HISTORY — PX: IR US GUIDE VASC ACCESS RIGHT: IMG2390

## 2017-11-14 LAB — CBC WITH DIFFERENTIAL/PLATELET
Basophils Absolute: 0 10*3/uL (ref 0.0–0.1)
Basophils Relative: 0 %
EOS PCT: 1 %
Eosinophils Absolute: 0.1 10*3/uL (ref 0.0–0.7)
HCT: 26.7 % — ABNORMAL LOW (ref 39.0–52.0)
Hemoglobin: 8.5 g/dL — ABNORMAL LOW (ref 13.0–17.0)
LYMPHS ABS: 0.7 10*3/uL (ref 0.7–4.0)
LYMPHS PCT: 13 %
MCH: 24.9 pg — AB (ref 26.0–34.0)
MCHC: 31.8 g/dL (ref 30.0–36.0)
MCV: 78.1 fL (ref 78.0–100.0)
MONO ABS: 0.8 10*3/uL (ref 0.1–1.0)
MONOS PCT: 13 %
Neutro Abs: 4.3 10*3/uL (ref 1.7–7.7)
Neutrophils Relative %: 73 %
PLATELETS: 306 10*3/uL (ref 150–400)
RBC: 3.42 MIL/uL — AB (ref 4.22–5.81)
RDW: 15.9 % — ABNORMAL HIGH (ref 11.5–15.5)
WBC: 5.9 10*3/uL (ref 4.0–10.5)

## 2017-11-14 LAB — COMPREHENSIVE METABOLIC PANEL
ALT: 20 U/L (ref 17–63)
ANION GAP: 11 (ref 5–15)
AST: 46 U/L — ABNORMAL HIGH (ref 15–41)
Albumin: 3.6 g/dL (ref 3.5–5.0)
Alkaline Phosphatase: 148 U/L — ABNORMAL HIGH (ref 38–126)
BUN: 55 mg/dL — ABNORMAL HIGH (ref 6–20)
CHLORIDE: 104 mmol/L (ref 101–111)
CO2: 20 mmol/L — ABNORMAL LOW (ref 22–32)
CREATININE: 1.72 mg/dL — AB (ref 0.61–1.24)
Calcium: 9.5 mg/dL (ref 8.9–10.3)
GFR, EST AFRICAN AMERICAN: 44 mL/min — AB (ref >60)
GFR, EST NON AFRICAN AMERICAN: 38 mL/min — AB (ref >60)
Glucose, Bld: 118 mg/dL — ABNORMAL HIGH (ref 65–99)
Potassium: 4.6 mmol/L (ref 3.5–5.1)
Sodium: 135 mmol/L (ref 135–145)
Total Bilirubin: 0.8 mg/dL (ref 0.3–1.2)
Total Protein: 7.3 g/dL (ref 6.5–8.1)

## 2017-11-14 LAB — PROTIME-INR
INR: 1.09
Prothrombin Time: 14 seconds (ref 11.4–15.2)

## 2017-11-14 MED ORDER — SODIUM CHLORIDE 0.9 % IV SOLN
INTRAVENOUS | Status: DC
  Administered 2017-11-14: 11:00:00 via INTRAVENOUS

## 2017-11-14 MED ORDER — CEFAZOLIN SODIUM-DEXTROSE 2-4 GM/100ML-% IV SOLN
INTRAVENOUS | Status: AC
  Administered 2017-11-14: 2 g via INTRAVENOUS
  Filled 2017-11-14: qty 100

## 2017-11-14 MED ORDER — FERUMOXYTOL INJECTION 510 MG/17 ML
510.0000 mg | Freq: Once | INTRAVENOUS | Status: AC
  Administered 2017-11-14: 510 mg via INTRAVENOUS
  Filled 2017-11-14: qty 17

## 2017-11-14 MED ORDER — LIDOCAINE HCL 2 % IJ SOLN
INTRAMUSCULAR | Status: AC
  Filled 2017-11-14: qty 10

## 2017-11-14 MED ORDER — CEFAZOLIN SODIUM-DEXTROSE 2-4 GM/100ML-% IV SOLN
2.0000 g | INTRAVENOUS | Status: AC
  Administered 2017-11-14: 2 g via INTRAVENOUS

## 2017-11-14 MED ORDER — LIDOCAINE HCL (PF) 1 % IJ SOLN
INTRAMUSCULAR | Status: AC
  Filled 2017-11-14: qty 30

## 2017-11-14 MED ORDER — SODIUM CHLORIDE 0.9 % IV SOLN
Freq: Once | INTRAVENOUS | Status: AC
  Administered 2017-11-14: 10:00:00 via INTRAVENOUS

## 2017-11-14 MED ORDER — MIDAZOLAM HCL 2 MG/2ML IJ SOLN
INTRAMUSCULAR | Status: AC
  Filled 2017-11-14: qty 4

## 2017-11-14 MED ORDER — FENTANYL CITRATE (PF) 100 MCG/2ML IJ SOLN
INTRAMUSCULAR | Status: AC | PRN
  Administered 2017-11-14 (×2): 50 ug via INTRAVENOUS

## 2017-11-14 MED ORDER — LIDOCAINE-EPINEPHRINE (PF) 2 %-1:200000 IJ SOLN
INTRAMUSCULAR | Status: AC
  Filled 2017-11-14: qty 20

## 2017-11-14 MED ORDER — OXYCODONE HCL 5 MG PO TABS: 5.0000 mg | ORAL_TABLET | ORAL | Status: DC | PRN

## 2017-11-14 MED ORDER — HEPARIN SOD (PORK) LOCK FLUSH 100 UNIT/ML IV SOLN
INTRAVENOUS | Status: AC
  Filled 2017-11-14: qty 5

## 2017-11-14 MED ORDER — MIDAZOLAM HCL 2 MG/2ML IJ SOLN
INTRAMUSCULAR | Status: AC | PRN
  Administered 2017-11-14 (×2): 1 mg via INTRAVENOUS

## 2017-11-14 MED ORDER — FENTANYL CITRATE (PF) 100 MCG/2ML IJ SOLN
INTRAMUSCULAR | Status: AC
  Filled 2017-11-14: qty 4

## 2017-11-14 NOTE — Progress Notes (Signed)
  Oncology Nurse Navigator Documentation  Navigator Location: [facility] (11/14/17 5784)   )Navigator Encounter Type: Treatment (11/14/17 6962)                     Patient Visit Type: Other(treatment - to collect purple top blood tube/pathology) (11/14/17 9528)       Interventions: Psycho-social support;Education (11/14/17 4132)  Ex-wife accompanied patient to infusion room for his Feraheme tx this am and was visibly upset that she did not understand what the patient's treatment plan was.  I reviewed the notes from patient's visit with Ned Card NP and Dr. Burr Medico from 11/05/17 and explained that patient would be having a tissue bx of the liver today as well as port placement. I also explained that patient's treatment plan was palliative and not curative as explained to patient during his visit on 11/05/17. Patsy shared that patient has "no one" to help him and "I just can't do it." I let her know that I would place a referral to social work and let his providers know. In front of the patient she asked why we don't  "just call in hospice." I explained that further questions could be discussed at patient appointment on 11/20/17. I will make a referral to social work.          Acuity: Level 2 (11/14/17 0939)   Acuity Level 2: Educational needs;Ongoing guidance and education throughout treatment as needed (11/14/17 4401)     Time Spent with Patient: 30 (11/14/17 0939)

## 2017-11-14 NOTE — Discharge Instructions (Signed)
Liver Biopsy, Care After These instructions give you information on caring for yourself after your procedure. Your doctor may also give you more specific instructions. Call your doctor if you have any problems or questions after your procedure. Follow these instructions at home:  Rest at home for 1-2 days or as told by your doctor.  Have someone stay with you for at least 24 hours.  Do not do these things in the first 24 hours: ? Drive. ? Use machinery. ? Take care of other people. ? Sign legal documents. ? Take a bath or shower.  There are many different ways to close and cover a cut (incision). For example, a cut can be closed with stitches, skin glue, or adhesive strips. Follow your doctor's instructions on: ? Taking care of your cut. ? Changing and removing your bandage (dressing). ? Removing whatever was used to close your cut.  Do not drink alcohol in the first week.  Do not lift more than 5 pounds or play contact sports for the first 2 weeks.  Take medicines only as told by your doctor. For 1 week, do not take medicine that has aspirin in it or medicines like ibuprofen.  Get your test results. Contact a doctor if:  A cut bleeds and leaves more than just a small spot of blood.  A cut is red, puffs up (swells), or hurts more than before.  Fluid or something else comes from a cut.  A cut smells bad.  You have a fever or chills. Get help right away if:  You have swelling, bloating, or pain in your belly (abdomen).  You get dizzy or faint.  You have a rash.  You feel sick to your stomach (nauseous) or throw up (vomit).  You have trouble breathing, feel short of breath, or feel faint.  Your chest hurts.  You have problems talking or seeing.  You have trouble balancing or moving your arms or legs. This information is not intended to replace advice given to you by your health care provider. Make sure you discuss any questions you have with your health care  provider. Document Released: 09/03/2008 Document Revised: 05/02/2016 Document Reviewed: 01/21/2014 Elsevier Interactive Patient Education  2018 [place]. Moderate Conscious Sedation, Adult, Care After These instructions provide you with information about caring for yourself after your procedure. Your health care provider may also give you more specific instructions. Your treatment has been planned according to current medical practices, but problems sometimes occur. Call your health care provider if you have any problems or questions after your procedure. What can I expect after the procedure? After your procedure, it is common:  To feel sleepy for several hours.  To feel clumsy and have poor balance for several hours.  To have poor judgment for several hours.  To vomit if you eat too soon.  Follow these instructions at home: For at least 24 hours after the procedure:   Do not: ? Participate in activities where you could fall or become injured. ? Drive. ? Use heavy machinery. ? Drink alcohol. ? Take sleeping pills or medicines that cause drowsiness. ? Make important decisions or sign legal documents. ? Take care of children on your own.  Rest. Eating and drinking  Follow the diet recommended by your health care provider.  If you vomit: ? Drink water, juice, or soup when you can drink without vomiting. ? Make sure you have little or no nausea before eating solid foods. General instructions  Have a responsible  adult stay with you until you are awake and alert.  Take over-the-counter and prescription medicines only as told by your health care provider.  If you smoke, do not smoke without supervision.  Keep all follow-up visits as told by your health care provider. This is important. Contact a health care provider if:  You keep feeling nauseous or you keep vomiting.  You feel light-headed.  You develop a rash.  You have a fever. Get help right away if:  You  have trouble breathing. This information is not intended to replace advice given to you by your health care provider. Make sure you discuss any questions you have with your health care provider. Document Released: 09/15/2013 Document Revised: 04/29/2016 Document Reviewed: 03/16/2016 Elsevier Interactive Patient Education  2018 [place] An implanted port is a type of central line that is placed under the skin. Central lines are used to provide IV access when treatment or nutrition needs to be given through a persons veins. Implanted ports are used for long-term IV access. An implanted port may be placed because:  You need IV medicine that would be irritating to the small veins in your hands or arms.  You need long-term IV medicines, such as antibiotics.  You need IV nutrition for a long period.  You need frequent blood draws for lab tests.  You need dialysis.  Implanted ports are usually placed in the chest area, but they can also be placed in the upper arm, the abdomen, or the leg. An implanted port has two main parts:  Reservoir. The reservoir is round and will appear as a small, raised area under your skin. The reservoir is the part where a needle is inserted to give medicines or draw blood.  Catheter. The catheter is a thin, flexible tube that extends from the reservoir. The catheter is placed into a large vein. Medicine that is inserted into the reservoir goes into the catheter and then into the vein.  How will I care for my incision site? Do not get the incision site wet. Bathe or shower as directed by your health care provider. How is my port accessed? Special steps must be taken to access the port:  Before the port is accessed, a numbing cream can be placed on the skin. This helps numb the skin over the port site.  Your health care provider uses a sterile technique to access the port. ? Your health care provider must put on a mask and sterile  gloves. ? The skin over your port is cleaned carefully with an antiseptic and allowed to dry. ? The port is gently pinched between sterile gloves, and a needle is inserted into the port.  Only "non-coring" port needles should be used to access the port. Once the port is accessed, a blood return should be checked. This helps ensure that the port is in the vein and is not clogged.  If your port needs to remain accessed for a constant infusion, a clear (transparent) bandage will be placed over the needle site. The bandage and needle will need to be changed every week, or as directed by your health care provider.  Keep the bandage covering the needle clean and dry. Do not get it wet. Follow your health care providers instructions on how to take a shower or bath while the port is accessed.  If your port does not need to stay accessed, no bandage is needed over the port.  What is flushing? Flushing helps  keep the port from getting clogged. Follow your health care providers instructions on how and when to flush the port. Ports are usually flushed with saline solution or a medicine called heparin. The need for flushing will depend on how the port is used.  If the port is used for intermittent medicines or blood draws, the port will need to be flushed: ? After medicines have been given. ? After blood has been drawn. ? As part of routine maintenance.  If a constant infusion is running, the port may not need to be flushed.  How long will my port stay implanted? The port can stay in for as long as your health care provider thinks it is needed. When it is time for the port to come out, surgery will be done to remove it. The procedure is similar to the one performed when the port was put in. When should I seek immediate medical care? When you have an implanted port, you should seek immediate medical care if:  You notice a bad smell coming from the incision site.  You have swelling, redness, or  drainage at the incision site.  You have more swelling or pain at the port site or the surrounding area.  You have a fever that is not controlled with medicine.  This information is not intended to replace advice given to you by your health care provider. Make sure you discuss any questions you have with your health care provider. Document Released: 11/25/2005 Document Revised: 05/02/2016 Document Reviewed: 08/02/2013 Elsevier Interactive Patient Education  2017 [employer].

## 2017-11-14 NOTE — Patient Instructions (Signed)

## 2017-11-14 NOTE — Progress Notes (Signed)
Met with patient to obtain signatures for Rob in pharmacy. Patient confirmed he has [employer] Medicare insurance and presented his new traditional Medicare card. Advised patient about one-time $400 [employer] grant and what it covers. Patient will bring proof of his income to apply for grant at next visit. ° °Advised Dee who does chemo authorizations that patient confirmed he has insurance.   °

## 2017-11-14 NOTE — H&P (Signed)
Chief Complaint: liver lesion and esophageal cancer  Referring Physician:Dr. Dominica Severin B. Villegas  Supervising Physician: William Villegas  Patient Status: [facility] - Out-pt  HPI: William Villegas is a 71 y.o. male who has been diagnosed with esophageal cancer.  He is followed by Dr. Benay Villegas.  He complains of dysphagia which is continuing to worsening.  He is now having trouble swallowing his ensure.  He is only able to take small amounts of thin liquids for the most part.  He is going to start chemotherapy soon and a request for a port a cath has been made.  He also have a lesions in his liver that needs to be biopsied to prove metastatic disease.  He presents today for this procedure.  He has no complaints except for his dysphagia.  Past Medical History:  Past Medical History:  Diagnosis Date  . COPD (chronic obstructive pulmonary disease) ([place])   . Hypertension     Past Surgical History:  Past Surgical History:  Procedure Laterality Date  . ESOPHAGOGASTRODUODENOSCOPY (EGD) WITH PROPOFOL N/A 10/28/2017   Procedure: ESOPHAGOGASTRODUODENOSCOPY (EGD) WITH PROPOFOL;  Surgeon: William Artist, MD;  Location: [facility] ENDOSCOPY;  Service: Endoscopy;  Laterality: N/A;  . SAVORY DILATION N/A 10/28/2017   Procedure: POSSIBLE SAVORY DILATION;  Surgeon: William Artist, MD;  Location: [facility] ENDOSCOPY;  Service: Endoscopy;  Laterality: N/A;    Family History: History reviewed. No pertinent family history.  Social History:  reports that he quit smoking about a year ago. His smoking use included cigarettes. He has a 12.50 pack-year smoking history. he has never used smokeless tobacco. He reports that he does not drink alcohol or use drugs.  Allergies: No Known Allergies  Medications: Medications reviewed in epic  Please HPI for pertinent positives, otherwise complete 10 system ROS negative.  Mallampati Score: MD Evaluation Airway: WNL Heart: WNL Abdomen: WNL Chest/ Lungs: WNL ASA  Classification:  3 Mallampati/Airway Score: One  Physical Exam: There were no vitals taken for this visit. There is no height or weight on file to calculate BMI. General: pleasant, cachetic white male who is laying in bed in NAD HEENT: head is normocephalic, atraumatic.  Sclera are noninjected.  PERRL.  Ears and nose without any masses or lesions.  Mouth is pink and moist Heart: regular, rate, and rhythm.  Normal s1,s2. No obvious murmurs, gallops, or rubs noted.  Palpable radial pulses bilaterally Lungs: CTAB, no wheezes, rhonchi, or rales noted.  Respiratory effort nonlabored Abd: soft, NT, ND, +BS, no masses, hernias, or organomegaly Psych: A&Ox3 with an appropriate affect.   Labs: INR 1.09 WBC 4.0 - 10.5 K/uL 5.9  8.5 R  RBC 4.22 - 5.81 MIL/uL 3.42 Abnormally low   3.42 Abnormally low  R  Hemoglobin 13.0 - 17.0 g/dL 8.5 Abnormally low   8.7 Abnormally low  R  HCT 39.0 - 52.0 % 26.7 Abnormally low   26.9 Abnormally low  R  MCV 78.0 - 100.0 fL 78.1  78.7 Abnormally low  R  MCH 26.0 - 34.0 pg 24.9 Abnormally low   25.4 Abnormally low  R  MCHC 30.0 - 36.0 g/dL 31.8  32.3 R  RDW 11.5 - 15.5 % 15.9 Abnormally high   15.6 Abnormally high  R  Platelets 150 - 400 K/uL 306       Imaging: No results found.  Assessment/Plan 1. Esophageal cancer with liver lesions  We will plan to place a PAC as well proceed with a liver lesion biopsy to  confirm metastatic disease.  His labs and vitals have been reviewed. Risks and benefits discussed with the patient including, but not limited to bleeding, infection, pneumothorax, or fibrin sheath development and need for additional procedures. All of the patient's questions were answered, patient is agreeable to proceed. Consent signed and in chart. Risks and benefits discussed with the patient including, but not limited to bleeding, infection, damage to adjacent structures or low yield requiring additional tests. All of the patient's questions were answered, patient is  agreeable to proceed. Consent signed and in chart.  Thank you for this interesting consult.  I greatly enjoyed meeting William Villegas and look forward to participating in their care.  A copy of this report was sent to the requesting provider on this date.  Electronically Signed: Henreitta Villegas 11/14/2017, 11:48 AM   I spent a total of  30 Minutes   in face to face in clinical consultation, greater than 50% of which was counseling/coordinating care for esophageal cancer

## 2017-11-14 NOTE — Procedures (Signed)
US guided liver lesion biopsy in right hepatic lobe.  4 cores obtained, no immediate complication.  Minimal blood loss.  Placement of right jugular port.  Tip at SVC/RA junction.  Minimal blood and no immediate complication.

## 2017-11-14 NOTE — Sedation Documentation (Signed)
In IR for port placement

## 2017-11-14 NOTE — Sedation Documentation (Signed)
Finished in [place] moving to IR for port placement.

## 2017-11-18 ENCOUNTER — Ambulatory Visit: Payer: Medicare PPO

## 2017-11-20 ENCOUNTER — Other Ambulatory Visit: Payer: Medicare PPO

## 2017-11-20 ENCOUNTER — Ambulatory Visit: Payer: Medicare PPO | Admitting: Nutrition

## 2017-11-20 ENCOUNTER — Ambulatory Visit: Payer: Medicare PPO

## 2017-11-20 ENCOUNTER — Encounter: Payer: Self-pay | Admitting: Nurse Practitioner

## 2017-11-20 ENCOUNTER — Encounter: Payer: Self-pay | Admitting: General Practice

## 2017-11-20 ENCOUNTER — Other Ambulatory Visit (HOSPITAL_BASED_OUTPATIENT_CLINIC_OR_DEPARTMENT_OTHER): Payer: Medicare PPO

## 2017-11-20 ENCOUNTER — Ambulatory Visit: Payer: Medicare PPO | Admitting: Hematology

## 2017-11-20 ENCOUNTER — Ambulatory Visit (HOSPITAL_BASED_OUTPATIENT_CLINIC_OR_DEPARTMENT_OTHER): Payer: Medicare PPO

## 2017-11-20 ENCOUNTER — Ambulatory Visit: Payer: Medicare PPO | Admitting: Nurse Practitioner

## 2017-11-20 ENCOUNTER — Ambulatory Visit (HOSPITAL_COMMUNITY)
Admission: RE | Admit: 2017-11-20 | Discharge: 2017-11-20 | Disposition: A | Discharge status: Home / Self Care | Payer: Medicare PPO | Source: Ambulatory Visit | Attending: Nurse Practitioner | Admitting: Nurse Practitioner

## 2017-11-20 ENCOUNTER — Ambulatory Visit (HOSPITAL_BASED_OUTPATIENT_CLINIC_OR_DEPARTMENT_OTHER): Payer: Medicare PPO | Admitting: Nurse Practitioner

## 2017-11-20 ENCOUNTER — Other Ambulatory Visit: Payer: Self-pay | Admitting: Emergency Medicine

## 2017-11-20 VITALS — BP 108/57 | HR 102 | Temp 97.8°F | Resp 18 | Ht 69.0 in | Wt 119.9 lb

## 2017-11-20 DIAGNOSIS — E279 Disorder of adrenal gland, unspecified: Secondary | ICD-10-CM

## 2017-11-20 DIAGNOSIS — D509 Iron deficiency anemia, unspecified: Secondary | ICD-10-CM | POA: Diagnosis not present

## 2017-11-20 DIAGNOSIS — L02212 Cutaneous abscess of back [any part, except buttock]: Secondary | ICD-10-CM | POA: Diagnosis not present

## 2017-11-20 DIAGNOSIS — Z5111 Encounter for antineoplastic chemotherapy: Secondary | ICD-10-CM

## 2017-11-20 DIAGNOSIS — C787 Secondary malignant neoplasm of liver and intrahepatic bile duct: Secondary | ICD-10-CM

## 2017-11-20 DIAGNOSIS — R634 Abnormal weight loss: Secondary | ICD-10-CM | POA: Diagnosis not present

## 2017-11-20 DIAGNOSIS — D649 Anemia, unspecified: Secondary | ICD-10-CM | POA: Insufficient documentation

## 2017-11-20 DIAGNOSIS — R1310 Dysphagia, unspecified: Secondary | ICD-10-CM | POA: Diagnosis not present

## 2017-11-20 DIAGNOSIS — C159 Malignant neoplasm of esophagus, unspecified: Secondary | ICD-10-CM | POA: Diagnosis present

## 2017-11-20 DIAGNOSIS — D500 Iron deficiency anemia secondary to blood loss (chronic): Secondary | ICD-10-CM

## 2017-11-20 DIAGNOSIS — C160 Malignant neoplasm of cardia: Secondary | ICD-10-CM

## 2017-11-20 LAB — CBC WITH DIFFERENTIAL/PLATELET
BASO%: 0.4 % (ref 0.0–2.0)
Basophils Absolute: 0 10*3/uL (ref 0.0–0.1)
EOS ABS: 0.3 10*3/uL (ref 0.0–0.5)
EOS%: 3.9 % (ref 0.0–7.0)
HEMATOCRIT: 27.8 % — AB (ref 38.4–49.9)
HEMOGLOBIN: 8.4 g/dL — AB (ref 13.0–17.1)
LYMPH#: 1.3 10*3/uL (ref 0.9–3.3)
LYMPH%: 18.3 % (ref 14.0–49.0)
MCH: 24.3 pg — ABNORMAL LOW (ref 27.2–33.4)
MCHC: 30.2 g/dL — ABNORMAL LOW (ref 32.0–36.0)
MCV: 80.6 fL (ref 79.3–98.0)
MONO#: 0.9 10*3/uL (ref 0.1–0.9)
MONO%: 12.9 % (ref 0.0–14.0)
NEUT%: 64.5 % (ref 39.0–75.0)
NEUTROS ABS: 4.7 10*3/uL (ref 1.5–6.5)
PLATELETS: 257 10*3/uL (ref 140–400)
RBC: 3.45 10*6/uL — ABNORMAL LOW (ref 4.20–5.82)
RDW: 17.2 % — ABNORMAL HIGH (ref 11.0–14.6)
WBC: 7.2 10*3/uL (ref 4.0–10.3)

## 2017-11-20 LAB — COMPREHENSIVE METABOLIC PANEL
ALBUMIN: 3 g/dL — AB (ref 3.5–5.0)
ALK PHOS: 221 U/L — AB (ref 40–150)
ALT: 31 U/L (ref 0–55)
AST: 43 U/L — ABNORMAL HIGH (ref 5–34)
Anion Gap: 9 mEq/L (ref 3–11)
BUN: 55.5 mg/dL — ABNORMAL HIGH (ref 7.0–26.0)
CALCIUM: 9.5 mg/dL (ref 8.4–10.4)
CHLORIDE: 109 meq/L (ref 98–109)
CO2: 21 mEq/L — ABNORMAL LOW (ref 22–29)
Creatinine: 1 mg/dL (ref 0.7–1.3)
GLUCOSE: 94 mg/dL (ref 70–140)
POTASSIUM: 4.2 meq/L (ref 3.5–5.1)
SODIUM: 139 meq/L (ref 136–145)
Total Bilirubin: 0.26 mg/dL (ref 0.20–1.20)
Total Protein: 6.6 g/dL (ref 6.4–8.3)

## 2017-11-20 LAB — PREPARE RBC (CROSSMATCH)

## 2017-11-20 MED ORDER — PALONOSETRON HCL INJECTION 0.25 MG/5ML
INTRAVENOUS | Status: AC
  Filled 2017-11-20: qty 5

## 2017-11-20 MED ORDER — DEXAMETHASONE SODIUM PHOSPHATE 10 MG/ML IJ SOLN
10.0000 mg | Freq: Once | INTRAMUSCULAR | Status: AC
  Administered 2017-11-20: 10 mg via INTRAVENOUS

## 2017-11-20 MED ORDER — LIDOCAINE-PRILOCAINE 2.5-2.5 % EX CREA: TOPICAL_CREAM | CUTANEOUS | 2 refills | Status: AC

## 2017-11-20 MED ORDER — PALONOSETRON HCL INJECTION 0.25 MG/5ML
0.2500 mg | Freq: Once | INTRAVENOUS | Status: AC
  Administered 2017-11-20: 0.25 mg via INTRAVENOUS

## 2017-11-20 MED ORDER — SODIUM CHLORIDE 0.9% FLUSH
10.0000 mL | INTRAVENOUS | Status: DC | PRN
  Administered 2017-11-20: 10 mL
  Filled 2017-11-20: qty 10

## 2017-11-20 MED ORDER — DEXAMETHASONE SODIUM PHOSPHATE 10 MG/ML IJ SOLN
INTRAMUSCULAR | Status: AC
  Filled 2017-11-20: qty 1

## 2017-11-20 MED ORDER — ONDANSETRON HCL 8 MG PO TABS: 8.0000 mg | ORAL_TABLET | Freq: Two times a day (BID) | ORAL | 1 refills | Status: DC | PRN

## 2017-11-20 MED ORDER — MIRTAZAPINE 7.5 MG PO TABS: 7.5000 mg | ORAL_TABLET | Freq: Every day | ORAL | 1 refills | Status: AC

## 2017-11-20 MED ORDER — SODIUM CHLORIDE 0.9 % IV SOLN
2000.0000 mg/m2 | INTRAVENOUS | Status: DC
  Administered 2017-11-20: 3300 mg via INTRAVENOUS
  Filled 2017-11-20: qty 66

## 2017-11-20 MED ORDER — SODIUM CHLORIDE 0.9 % IV SOLN
510.0000 mg | Freq: Once | INTRAVENOUS | Status: AC
  Administered 2017-11-20: 510 mg via INTRAVENOUS
  Filled 2017-11-20: qty 17

## 2017-11-20 MED ORDER — DEXTROSE 5 % IV SOLN
Freq: Once | INTRAVENOUS | Status: AC
  Administered 2017-11-20: 14:00:00 via INTRAVENOUS

## 2017-11-20 MED ORDER — SODIUM CHLORIDE 0.9 % IV SOLN
Freq: Once | INTRAVENOUS | Status: AC
  Administered 2017-11-20: 17:00:00 via INTRAVENOUS

## 2017-11-20 MED ORDER — DEXTROSE 5 % IV SOLN
400.0000 mg/m2 | Freq: Once | INTRAVENOUS | Status: AC
  Administered 2017-11-20: 664 mg via INTRAVENOUS
  Filled 2017-11-20: qty 33.2

## 2017-11-20 MED ORDER — OXALIPLATIN CHEMO INJECTION 100 MG/20ML
60.0000 mg/m2 | Freq: Once | INTRAVENOUS | Status: AC
  Administered 2017-11-20: 100 mg via INTRAVENOUS
  Filled 2017-11-20: qty 20

## 2017-11-20 NOTE — Progress Notes (Signed)
[place] Spiritual Care Note  Followed up with William Villegas and his ex-wife (one of his support people) in infusion. He is hopeful that treatment will shrink the tumor enough for him to be able to eat and is marshaling his resolve to "beat this thing," even though he knows his doctors say that his cancer is incurable. His former wife is much more settled and comfortable now that her informational concerns have been addressed. They state that there are no other needs at this time, but are aware of ongoing Spiritual Care availability should needs arise.   [place], [place], [facility] Pager [phone] Voicemail [phone]

## 2017-11-20 NOTE — Progress Notes (Signed)
[place] Comprehensive Psychosocial Assessment Clinical Social Work  Clinical Social Work was referred by Marine scientist.  Clinical Social Worker Edwyna Shell met with patient in the Infusion Room to assess psychosocial, emotional, mental health, and spiritual needs of the patient.  Patient's knowledge about cancer and its treatment including level of understanding, reactions, goals for care, and expectations: "I just want to get this over with, I want to be able to eat, I cannot get anything down."  Focused on difficulty w swallowing, aware of cancer diagnosis and treatment plan; however, focused on current symptoms  Characteristics of the patient's support system:  Lives w 26 yo son who works during day but is in the home at night.  Ex wife states "I check on him every day, I am able to get him to his appointments and make sure he has what he needs."  Patient does not want additional help in the home, feels he has the resources he needs between ex wife and adult son  Patient and family psychosocial functioning including strengths, limitations, and coping skills: determined, clear about his wishes, has always been active and independent; reluctant to accept help  Identifications of barriers to care:   Limited resources for personal care assistance unless level of need rises to home health care needs.    Availability of community resources:  Home health support dependent on coverage by current insurance; [place] [facility] and [facility] can both assist w transport if needed.  Patient declined both, ex wife states she is available to transport to all appointments.  Clinical Social Worker follow up needed: No.  CSW left information on [employer] and business card so patient/caregiver can contact as needs arise; treatment team encouraged to reconsult as needed.    If yes, follow up plan:   Edwyna Shell, LCSW Clinical Social Worker Phone:  [phone]

## 2017-11-20 NOTE — Progress Notes (Signed)
  [facility] OFFICE PROGRESS NOTE   Diagnosis: Gastroesophageal carcinoma  INTERVAL HISTORY:   William Villegas returns as scheduled.  He continues to have dysphagia.  He is tolerating liquids including nutritional supplements.  He estimates drinking 4 or 5 nutritional supplements a day.  He continues to lose weight.  Activity level is poor.  He reports the skin abscess on his back is much better.  Objective:  Vital signs in last 24 hours:  Blood pressure (!) 108/57, pulse (!) 102, temperature 97.8 F (36.6 C), temperature source Oral, resp. rate 18, height 5\' 9"  (1.753 m), weight 119 lb 14.4 oz (54.4 kg), SpO2 100 %.    HEENT: No thrush or ulcers. Resp: Distant breath sounds. Cardio: Regular rate and rhythm. GI: Abdomen soft and nontender.  No hepatomegaly. Vascular: No leg edema. Neuro: Alert and oriented. Skin: Abscess at the mid back appears to be resolving. Port-A-Cath without erythema.   Lab Results:  Lab Results  Component Value Date   WBC 7.2 11/20/2017   HGB 8.4 (L) 11/20/2017   HCT 27.8 (L) 11/20/2017   MCV 80.6 11/20/2017   PLT 257 11/20/2017   NEUTROABS 4.7 11/20/2017    Imaging:  No results found.  Medications: I have reviewed the patient's current medications.  Assessment/Plan: 1. Gastroesophageal junction adenocarcinoma   Upper endoscopy 10/28/2017 with findings of a partially obstructing likely malignant submucosal esophageal tumor at the gastroesophageal junction contiguous with a gastric cardia mass.  Biopsy of both the esophagus and gastric cardia showed poorly differentiated carcinoma  CT scans chest/abdomen/pelvis 11/03/2017 with a 4 x 6 cm mass at the gastroesophageal junction; diffuse hepatic metastases; mild paraesophageal and gastrohepatic ligament adenopathy; by 1.8 cm right adrenal mass  Biopsy liver lesion 11/14/2017-metastatic poorly differentiated adenocarcinoma  Cycle 1 FOLFOX 11/20/2017 2. Dysphagia secondary to  #1 3. Weight loss secondary to #1 4. COPD 5. Skin abscess mid back.  Resolving.  He completed a course of Bactrim. 6. Port-A-Cath placement 11/14/2017 7. Iron deficiency anemia.  He received Feraheme 11/14/2017.  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  We again reviewed at today's visit that no therapy will be curative.  We discussed proceeding with FOLFOX chemotherapy versus a supportive care approach.  We also discussed radiation for palliation of the dysphagia.  He would like to proceed with the chemotherapy.  The doses will be adjusted due to his poor performance status.  He has iron deficiency anemia.  He received Feraheme 11/14/2017.  He will try to arrange for him to receive the second dose of Feraheme today.  We will schedule him for transfusion of 1 unit of blood on 11/22/2017.  His nutritional status is poor.  We will request the William Villegas dietitian meet with him today.  He will try to increase intake of nutritional supplements.  He will return for a follow-up visit and cycle 2 FOLFOX in 2 weeks.  He will contact the office in the interim with any problems.  Patient seen with Dr. Burr Medico.   Ned Card ANP/GNP-BC   11/20/2017  12:26 PM

## 2017-11-20 NOTE — Patient Instructions (Signed)
[facility] Discharge Instructions for Patients Receiving Chemotherapy  Today you received the following chemotherapy agents: Oxaliplatin, Leucovorin, 5fu   To help prevent nausea and vomiting after your treatment, we encourage you to take your nausea medication as prescribed.    If you develop nausea and vomiting that is not controlled by your nausea medication, call the clinic.   BELOW ARE SYMPTOMS THAT SHOULD BE REPORTED IMMEDIATELY:  *FEVER GREATER THAN 100.5 F  *CHILLS WITH OR WITHOUT FEVER  NAUSEA AND VOMITING THAT IS NOT CONTROLLED WITH YOUR NAUSEA MEDICATION  *UNUSUAL SHORTNESS OF BREATH  *UNUSUAL BRUISING OR BLEEDING  TENDERNESS IN MOUTH AND THROAT WITH OR WITHOUT PRESENCE OF ULCERS  *URINARY PROBLEMS  *BOWEL PROBLEMS  UNUSUAL RASH Items with * indicate a potential emergency and should be followed up as soon as possible.  Feel free to call the clinic should you have any questions or concerns. The clinic phone number is [phone].  Please show the CHEMO ALERT CARD at check-in to the Emergency Department and triage nurse.  Oxaliplatin Injection What is this medicine? OXALIPLATIN (ox AL i PLA tin) is a chemotherapy drug. It targets fast dividing cells, like cancer cells, and causes these cells to die. This medicine is used to treat cancers of the colon and rectum, and many other cancers. This medicine may be used for other purposes; ask your health care provider or pharmacist if you have questions. COMMON BRAND NAME(S): Eloxatin What should I tell my health care provider before I take this medicine? They need to know if you have any of these conditions: -kidney disease -an unusual or allergic reaction to oxaliplatin, other chemotherapy, other medicines, foods, dyes, or preservatives -pregnant or trying to get pregnant -breast-feeding How should I use this medicine? This drug is given as an infusion into a vein. It is administered in a hospital or  clinic by a specially trained health care professional. Talk to your pediatrician regarding the use of this medicine in children. Special care may be needed. Overdosage: If you think you have taken too much of this medicine contact a poison control center or emergency room at once. NOTE: This medicine is only for you. Do not share this medicine with others. What if I miss a dose? It is important not to miss a dose. Call your doctor or health care professional if you are unable to keep an appointment. What may interact with this medicine? -medicines to increase blood counts like filgrastim, pegfilgrastim, sargramostim -probenecid -some antibiotics like amikacin, gentamicin, neomycin, polymyxin B, streptomycin, tobramycin -zalcitabine Talk to your doctor or health care professional before taking any of these medicines: -acetaminophen -aspirin -ibuprofen -ketoprofen -naproxen This list may not describe all possible interactions. Give your health care provider a list of all the medicines, herbs, non-prescription drugs, or dietary supplements you use. Also tell them if you smoke, drink alcohol, or use illegal drugs. Some items may interact with your medicine. What should I watch for while using this medicine? Your condition will be monitored carefully while you are receiving this medicine. You will need important blood work done while you are taking this medicine. This medicine can make you more sensitive to cold. Do not drink cold drinks or use ice. Cover exposed skin before coming in contact with cold temperatures or cold objects. When out in cold weather wear warm clothing and cover your mouth and nose to warm the air that goes into your lungs. Tell your doctor if you get sensitive to the   cold. This drug may make you feel generally unwell. This is not uncommon, as chemotherapy can affect healthy cells as well as cancer cells. Report any side effects. Continue your course of treatment even though  you feel ill unless your doctor tells you to stop. In some cases, you may be given additional medicines to help with side effects. Follow all directions for their use. Call your doctor or health care professional for advice if you get a fever, chills or sore throat, or other symptoms of a cold or flu. Do not treat yourself. This drug decreases your body's ability to fight infections. Try to avoid being around people who are sick. This medicine may increase your risk to bruise or bleed. Call your doctor or health care professional if you notice any unusual bleeding. Be careful brushing and flossing your teeth or using a toothpick because you may get an infection or bleed more easily. If you have any dental work done, tell your dentist you are receiving this medicine. Avoid taking products that contain aspirin, acetaminophen, ibuprofen, naproxen, or ketoprofen unless instructed by your doctor. These medicines may hide a fever. Do not become pregnant while taking this medicine. Women should inform their doctor if they wish to become pregnant or think they might be pregnant. There is a potential for serious side effects to an unborn child. Talk to your health care professional or pharmacist for more information. Do not breast-feed an infant while taking this medicine. Call your doctor or health care professional if you get diarrhea. Do not treat yourself. What side effects may I notice from receiving this medicine? Side effects that you should report to your doctor or health care professional as soon as possible: -allergic reactions like skin rash, itching or hives, swelling of the face, lips, or tongue -low blood counts - This drug may decrease the number of white blood cells, red blood cells and platelets. You may be at increased risk for infections and bleeding. -signs of infection - fever or chills, cough, sore throat, pain or difficulty passing urine -signs of decreased platelets or bleeding -  bruising, pinpoint red spots on the skin, black, tarry stools, nosebleeds -signs of decreased red blood cells - unusually weak or tired, fainting spells, lightheadedness -breathing problems -chest pain, pressure -cough -diarrhea -jaw tightness -mouth sores -nausea and vomiting -pain, swelling, redness or irritation at the injection site -pain, tingling, numbness in the hands or feet -problems with balance, talking, walking -redness, blistering, peeling or loosening of the skin, including inside the mouth -trouble passing urine or change in the amount of urine Side effects that usually do not require medical attention (report to your doctor or health care professional if they continue or are bothersome): -changes in vision -constipation -hair loss -loss of appetite -metallic taste in the mouth or changes in taste -stomach pain This list may not describe all possible side effects. Call your doctor for medical advice about side effects. You may report side effects to FDA at 1-800-FDA-1088. Where should I keep my medicine? This drug is given in a hospital or clinic and will not be stored at home. NOTE: This sheet is a summary. It may not cover all possible information. If you have questions about this medicine, talk to your doctor, pharmacist, or health care provider.  2018 Elsevier/Gold Standard (2008-06-21 17:22:47) Leucovorin injection What is this medicine? LEUCOVORIN (loo koe VOR in) is used to prevent or treat the harmful effects of some medicines. This medicine is used to treat   anemia caused by a low amount of folic acid in the body. It is also used with 5-fluorouracil (5-FU) to treat colon cancer. This medicine may be used for other purposes; ask your health care provider or pharmacist if you have questions. What should I tell my health care provider before I take this medicine? They need to know if you have any of these conditions: -anemia from low levels of vitamin B-12 in the  blood -an unusual or allergic reaction to leucovorin, folic acid, other medicines, foods, dyes, or preservatives -pregnant or trying to get pregnant -breast-feeding How should I use this medicine? This medicine is for injection into a muscle or into a vein. It is given by a health care professional in a hospital or clinic setting. Talk to your pediatrician regarding the use of this medicine in children. Special care may be needed. Overdosage: If you think you have taken too much of this medicine contact a poison control center or emergency room at once. NOTE: This medicine is only for you. Do not share this medicine with others. What if I miss a dose? This does not apply. What may interact with this medicine? -capecitabine -fluorouracil -phenobarbital -phenytoin -primidone -trimethoprim-sulfamethoxazole This list may not describe all possible interactions. Give your health care provider a list of all the medicines, herbs, non-prescription drugs, or dietary supplements you use. Also tell them if you smoke, drink alcohol, or use illegal drugs. Some items may interact with your medicine. What should I watch for while using this medicine? Your condition will be monitored carefully while you are receiving this medicine. This medicine may increase the side effects of 5-fluorouracil, 5-FU. Tell your doctor or health care professional if you have diarrhea or mouth sores that do not get better or that get worse. What side effects may I notice from receiving this medicine? Side effects that you should report to your doctor or health care professional as soon as possible: -allergic reactions like skin rash, itching or hives, swelling of the face, lips, or tongue -breathing problems -fever, infection -mouth sores -unusual bleeding or bruising -unusually weak or tired Side effects that usually do not require medical attention (report to your doctor or health care professional if they continue or are  bothersome): -constipation or diarrhea -loss of appetite -nausea, vomiting This list may not describe all possible side effects. Call your doctor for medical advice about side effects. You may report side effects to FDA at 1-800-FDA-1088. Where should I keep my medicine? This drug is given in a hospital or clinic and will not be stored at home. NOTE: This sheet is a summary. It may not cover all possible information. If you have questions about this medicine, talk to your doctor, pharmacist, or health care provider.  2018 Elsevier/Gold Standard (2008-05-31 16:50:29) Fluorouracil, 5-FU injection What is this medicine? FLUOROURACIL, 5-FU (flure oh YOOR a sil) is a chemotherapy drug. It slows the growth of cancer cells. This medicine is used to treat many types of cancer like breast cancer, colon or rectal cancer, pancreatic cancer, and stomach cancer. This medicine may be used for other purposes; ask your health care provider or pharmacist if you have questions. COMMON BRAND NAME(S): Adrucil What should I tell my health care provider before I take this medicine? They need to know if you have any of these conditions: -blood disorders -dihydropyrimidine dehydrogenase (DPD) deficiency -infection (especially a virus infection such as chickenpox, cold sores, or herpes) -kidney disease -liver disease -malnourished, poor nutrition -recent or ongoing   radiation therapy -an unusual or allergic reaction to fluorouracil, other chemotherapy, other medicines, foods, dyes, or preservatives -pregnant or trying to get pregnant -breast-feeding How should I use this medicine? This drug is given as an infusion or injection into a vein. It is administered in a hospital or clinic by a specially trained health care professional. Talk to your pediatrician regarding the use of this medicine in children. Special care may be needed. Overdosage: If you think you have taken too much of this medicine contact a poison  control center or emergency room at once. NOTE: This medicine is only for you. Do not share this medicine with others. What if I miss a dose? It is important not to miss your dose. Call your doctor or health care professional if you are unable to keep an appointment. What may interact with this medicine? -allopurinol -cimetidine -dapsone -digoxin -hydroxyurea -leucovorin -levamisole -medicines for seizures like ethotoin, fosphenytoin, phenytoin -medicines to increase blood counts like filgrastim, pegfilgrastim, sargramostim -medicines that treat or prevent blood clots like warfarin, enoxaparin, and dalteparin -methotrexate -metronidazole -pyrimethamine -some other chemotherapy drugs like busulfan, cisplatin, estramustine, vinblastine -trimethoprim -trimetrexate -vaccines Talk to your doctor or health care professional before taking any of these medicines: -acetaminophen -aspirin -ibuprofen -ketoprofen -naproxen This list may not describe all possible interactions. Give your health care provider a list of all the medicines, herbs, non-prescription drugs, or dietary supplements you use. Also tell them if you smoke, drink alcohol, or use illegal drugs. Some items may interact with your medicine. What should I watch for while using this medicine? Visit your doctor for checks on your progress. This drug may make you feel generally unwell. This is not uncommon, as chemotherapy can affect healthy cells as well as cancer cells. Report any side effects. Continue your course of treatment even though you feel ill unless your doctor tells you to stop. In some cases, you may be given additional medicines to help with side effects. Follow all directions for their use. Call your doctor or health care professional for advice if you get a fever, chills or sore throat, or other symptoms of a cold or flu. Do not treat yourself. This drug decreases your body's ability to fight infections. Try to avoid  being around people who are sick. This medicine may increase your risk to bruise or bleed. Call your doctor or health care professional if you notice any unusual bleeding. Be careful brushing and flossing your teeth or using a toothpick because you may get an infection or bleed more easily. If you have any dental work done, tell your dentist you are receiving this medicine. Avoid taking products that contain aspirin, acetaminophen, ibuprofen, naproxen, or ketoprofen unless instructed by your doctor. These medicines may hide a fever. Do not become pregnant while taking this medicine. Women should inform their doctor if they wish to become pregnant or think they might be pregnant. There is a potential for serious side effects to an unborn child. Talk to your health care professional or pharmacist for more information. Do not breast-feed an infant while taking this medicine. Men should inform their doctor if they wish to father a child. This medicine may lower sperm counts. Do not treat diarrhea with over the counter products. Contact your doctor if you have diarrhea that lasts more than 2 days or if it is severe and watery. This medicine can make you more sensitive to the sun. Keep out of the sun. If you cannot avoid being in the sun, wear   protective clothing and use sunscreen. Do not use sun lamps or tanning beds/booths. What side effects may I notice from receiving this medicine? Side effects that you should report to your doctor or health care professional as soon as possible: -allergic reactions like skin rash, itching or hives, swelling of the face, lips, or tongue -low blood counts - this medicine may decrease the number of white blood cells, red blood cells and platelets. You may be at increased risk for infections and bleeding. -signs of infection - fever or chills, cough, sore throat, pain or difficulty passing urine -signs of decreased platelets or bleeding - bruising, pinpoint red spots on the  skin, black, tarry stools, blood in the urine -signs of decreased red blood cells - unusually weak or tired, fainting spells, lightheadedness -breathing problems -changes in vision -chest pain -mouth sores -nausea and vomiting -pain, swelling, redness at site where injected -pain, tingling, numbness in the hands or feet -redness, swelling, or sores on hands or feet -stomach pain -unusual bleeding Side effects that usually do not require medical attention (report to your doctor or health care professional if they continue or are bothersome): -changes in finger or toe nails -diarrhea -dry or itchy skin -hair loss -headache -loss of appetite -sensitivity of eyes to the light -stomach upset -unusually teary eyes This list may not describe all possible side effects. Call your doctor for medical advice about side effects. You may report side effects to FDA at 1-800-FDA-1088. Where should I keep my medicine? This drug is given in a hospital or clinic and will not be stored at home. NOTE: This sheet is a summary. It may not cover all possible information. If you have questions about this medicine, talk to your doctor, pharmacist, or health care provider.  2018 Elsevier/Gold Standard (2008-03-30 13:53:16)   

## 2017-11-20 NOTE — Progress Notes (Signed)
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 He is patient of Dr. Burr Medico.  Past medical history includes hypertension, COPD, and ARDS and anemia.  Medications include Remeron, Zofran, Protonix, and Compazine  Labs include BUN 55.5 and albumin 3.0.  Height: 69 inches. Weight: 119.9 pounds. Usual body weight: 150-160 pounds per patient. BMI: 17.71.  Estimated nutrition needs: 1800-2000 calories, 75-90 grams protein, 2 L fluid.  Patient reports he is trying to drink 4-5 Ensure Plus daily.  He prefers chocolate and strawberry flavor. Finding it difficult to consume most solid food.  He states he can eat pudding and [employer] chili. Patient extremely frustrated with inability to eat. Patient is refusing a feeding tube at this time. Patient does appear to have severe muscle wasting.  Physical exam was deferred. It is likely patient has severe malnutrition however cannot confirm at this time.  Nutrition diagnosis:  Unintentional weight loss related to new diagnosis of metastatic cancer as evidenced by 20% weight loss from usual body weight.  Intervention: Educated patient on strategies for increasing calories and protein. Reviewed soft foods patient may tolerate and provided fact sheets. Recommended patient consume between 5 and 6 Ensure Plus daily. I provided samples and coupons. Teach back method was used. Contact information given.  Monitoring, evaluation, goals:  Patient will tolerate increased calories and protein to minimize further weight loss.  Next visit: To be scheduled with treatment.  **Disclaimer: This note was dictated with voice recognition software. Similar sounding words can inadvertently be transcribed and this note may contain transcription errors which may not have been corrected upon publication of note.**

## 2017-11-20 NOTE — Progress Notes (Signed)
PerDr. Burr Medico, ok to run pump over 44hours.   Wylene Simmer, BSN, RN 11/20/2017 3:46 PM

## 2017-11-21 ENCOUNTER — Ambulatory Visit: Payer: Medicare PPO

## 2017-11-21 ENCOUNTER — Other Ambulatory Visit: Payer: Medicare PPO

## 2017-11-21 ENCOUNTER — Ambulatory Visit: Payer: Medicare PPO | Admitting: Hematology

## 2017-11-21 LAB — ABO/RH: ABO/RH(D): A POS

## 2017-11-22 ENCOUNTER — Ambulatory Visit (HOSPITAL_BASED_OUTPATIENT_CLINIC_OR_DEPARTMENT_OTHER): Payer: Medicare PPO

## 2017-11-22 VITALS — BP 104/57 | HR 82 | Temp 98.5°F | Resp 17

## 2017-11-22 DIAGNOSIS — C159 Malignant neoplasm of esophagus, unspecified: Secondary | ICD-10-CM

## 2017-11-22 DIAGNOSIS — C160 Malignant neoplasm of cardia: Secondary | ICD-10-CM

## 2017-11-22 DIAGNOSIS — C787 Secondary malignant neoplasm of liver and intrahepatic bile duct: Secondary | ICD-10-CM

## 2017-11-22 DIAGNOSIS — D649 Anemia, unspecified: Secondary | ICD-10-CM | POA: Diagnosis not present

## 2017-11-22 MED ORDER — SODIUM CHLORIDE 0.9% FLUSH
3.0000 mL | INTRAVENOUS | Status: DC | PRN
  Filled 2017-11-22: qty 10

## 2017-11-22 MED ORDER — SODIUM CHLORIDE 0.9 % IV SOLN
250.0000 mL | Freq: Once | INTRAVENOUS | Status: AC
  Administered 2017-11-22: 250 mL via INTRAVENOUS

## 2017-11-22 MED ORDER — SODIUM CHLORIDE 0.9% FLUSH
10.0000 mL | INTRAVENOUS | Status: DC | PRN
  Filled 2017-11-22: qty 10

## 2017-11-22 MED ORDER — HEPARIN SOD (PORK) LOCK FLUSH 100 UNIT/ML IV SOLN
500.0000 [IU] | Freq: Once | INTRAVENOUS | Status: AC | PRN
  Administered 2017-11-22: 500 [IU]
  Filled 2017-11-22: qty 5

## 2017-11-22 MED ORDER — HEPARIN SOD (PORK) LOCK FLUSH 100 UNIT/ML IV SOLN
250.0000 [IU] | INTRAVENOUS | Status: DC | PRN
  Filled 2017-11-22: qty 5

## 2017-11-22 MED ORDER — HEPARIN SOD (PORK) LOCK FLUSH 100 UNIT/ML IV SOLN
500.0000 [IU] | Freq: Every day | INTRAVENOUS | Status: DC | PRN
  Filled 2017-11-22: qty 5

## 2017-11-22 MED ORDER — SODIUM CHLORIDE 0.9% FLUSH
10.0000 mL | INTRAVENOUS | Status: AC | PRN
  Administered 2017-11-22: 10 mL
  Filled 2017-11-22: qty 10

## 2017-11-22 NOTE — Patient Instructions (Signed)

## 2017-11-23 LAB — BPAM RBC
Blood Product Expiration Date: 201812282359
ISSUE DATE / TIME: 201812150909
UNIT TYPE AND RH: 6200

## 2017-11-23 LAB — TYPE AND SCREEN
ABO/RH(D): A POS
Antibody Screen: NEGATIVE
UNIT DIVISION: 0

## 2017-11-24 ENCOUNTER — Encounter: Payer: Medicare PPO | Admitting: Nutrition

## 2017-11-25 ENCOUNTER — Telehealth: Payer: Self-pay | Admitting: Oncology

## 2017-11-25 NOTE — Telephone Encounter (Signed)
Scheduled appt per 12/13 los - spoke with patient regarding appts

## 2017-11-27 ENCOUNTER — Other Ambulatory Visit: Payer: Self-pay | Admitting: Hematology

## 2017-11-28 ENCOUNTER — Telehealth: Payer: Self-pay | Admitting: Nurse Practitioner

## 2017-11-28 NOTE — Telephone Encounter (Signed)
Scheduled appt per 12/19 sch msg - spoke with patient regarding appts that were added °

## 2017-12-04 ENCOUNTER — Telehealth: Payer: Self-pay | Admitting: Nurse Practitioner

## 2017-12-04 ENCOUNTER — Ambulatory Visit (HOSPITAL_BASED_OUTPATIENT_CLINIC_OR_DEPARTMENT_OTHER): Payer: Medicare PPO | Admitting: Nurse Practitioner

## 2017-12-04 ENCOUNTER — Other Ambulatory Visit (HOSPITAL_BASED_OUTPATIENT_CLINIC_OR_DEPARTMENT_OTHER): Payer: Medicare PPO

## 2017-12-04 ENCOUNTER — Ambulatory Visit (HOSPITAL_BASED_OUTPATIENT_CLINIC_OR_DEPARTMENT_OTHER): Payer: Medicare PPO

## 2017-12-04 ENCOUNTER — Ambulatory Visit: Payer: Medicare PPO

## 2017-12-04 ENCOUNTER — Ambulatory Visit: Payer: Medicare PPO | Admitting: Nurse Practitioner

## 2017-12-04 ENCOUNTER — Encounter: Payer: Self-pay | Admitting: Nurse Practitioner

## 2017-12-04 VITALS — BP 131/67 | HR 109 | Temp 97.5°F | Resp 18 | Ht 69.0 in | Wt 128.4 lb

## 2017-12-04 DIAGNOSIS — C159 Malignant neoplasm of esophagus, unspecified: Secondary | ICD-10-CM

## 2017-12-04 DIAGNOSIS — D500 Iron deficiency anemia secondary to blood loss (chronic): Secondary | ICD-10-CM

## 2017-12-04 DIAGNOSIS — L02212 Cutaneous abscess of back [any part, except buttock]: Secondary | ICD-10-CM | POA: Diagnosis not present

## 2017-12-04 DIAGNOSIS — C160 Malignant neoplasm of cardia: Secondary | ICD-10-CM

## 2017-12-04 DIAGNOSIS — R1310 Dysphagia, unspecified: Secondary | ICD-10-CM

## 2017-12-04 DIAGNOSIS — Z95828 Presence of other vascular implants and grafts: Secondary | ICD-10-CM

## 2017-12-04 DIAGNOSIS — R634 Abnormal weight loss: Secondary | ICD-10-CM | POA: Diagnosis not present

## 2017-12-04 DIAGNOSIS — Z5111 Encounter for antineoplastic chemotherapy: Secondary | ICD-10-CM

## 2017-12-04 DIAGNOSIS — C787 Secondary malignant neoplasm of liver and intrahepatic bile duct: Secondary | ICD-10-CM | POA: Diagnosis not present

## 2017-12-04 DIAGNOSIS — D509 Iron deficiency anemia, unspecified: Secondary | ICD-10-CM | POA: Diagnosis not present

## 2017-12-04 DIAGNOSIS — J449 Chronic obstructive pulmonary disease, unspecified: Secondary | ICD-10-CM | POA: Diagnosis not present

## 2017-12-04 LAB — COMPREHENSIVE METABOLIC PANEL
ALT: 22 U/L (ref 0–55)
ANION GAP: 8 meq/L (ref 3–11)
AST: 35 U/L — ABNORMAL HIGH (ref 5–34)
Albumin: 2.6 g/dL — ABNORMAL LOW (ref 3.5–5.0)
Alkaline Phosphatase: 287 U/L — ABNORMAL HIGH (ref 40–150)
BUN: 21.4 mg/dL (ref 7.0–26.0)
CHLORIDE: 106 meq/L (ref 98–109)
CO2: 26 meq/L (ref 22–29)
Calcium: 8.9 mg/dL (ref 8.4–10.4)
Creatinine: 0.7 mg/dL (ref 0.7–1.3)
Glucose: 84 mg/dl (ref 70–140)
Potassium: 4.3 mEq/L (ref 3.5–5.1)
Sodium: 141 mEq/L (ref 136–145)
Total Bilirubin: 0.36 mg/dL (ref 0.20–1.20)
Total Protein: 5.8 g/dL — ABNORMAL LOW (ref 6.4–8.3)

## 2017-12-04 LAB — CBC WITH DIFFERENTIAL/PLATELET
BASO%: 0.2 % (ref 0.0–2.0)
BASOS ABS: 0 10*3/uL (ref 0.0–0.1)
EOS ABS: 0.3 10*3/uL (ref 0.0–0.5)
EOS%: 5.2 % (ref 0.0–7.0)
HEMATOCRIT: 28.1 % — AB (ref 38.4–49.9)
HEMOGLOBIN: 8.6 g/dL — AB (ref 13.0–17.1)
LYMPH#: 0.8 10*3/uL — AB (ref 0.9–3.3)
LYMPH%: 14.9 % (ref 14.0–49.0)
MCH: 26.1 pg — AB (ref 27.2–33.4)
MCHC: 30.6 g/dL — ABNORMAL LOW (ref 32.0–36.0)
MCV: 85.2 fL (ref 79.3–98.0)
MONO#: 0.9 10*3/uL (ref 0.1–0.9)
MONO%: 17.7 % — ABNORMAL HIGH (ref 0.0–14.0)
NEUT%: 62 % (ref 39.0–75.0)
NEUTROS ABS: 3.1 10*3/uL (ref 1.5–6.5)
Platelets: 201 10*3/uL (ref 140–400)
RBC: 3.3 10*6/uL — ABNORMAL LOW (ref 4.20–5.82)
RDW: 19.5 % — AB (ref 11.0–14.6)
WBC: 5 10*3/uL (ref 4.0–10.3)

## 2017-12-04 MED ORDER — PALONOSETRON HCL INJECTION 0.25 MG/5ML
INTRAVENOUS | Status: AC
  Filled 2017-12-04: qty 5

## 2017-12-04 MED ORDER — DEXAMETHASONE SODIUM PHOSPHATE 10 MG/ML IJ SOLN
INTRAMUSCULAR | Status: AC
  Filled 2017-12-04: qty 1

## 2017-12-04 MED ORDER — OXALIPLATIN CHEMO INJECTION 100 MG/20ML
60.0000 mg/m2 | Freq: Once | INTRAVENOUS | Status: AC
  Administered 2017-12-04: 100 mg via INTRAVENOUS
  Filled 2017-12-04: qty 20

## 2017-12-04 MED ORDER — SODIUM CHLORIDE 0.9% FLUSH
10.0000 mL | INTRAVENOUS | Status: DC | PRN
  Administered 2017-12-04: 10 mL via INTRAVENOUS
  Filled 2017-12-04: qty 10

## 2017-12-04 MED ORDER — ALTEPLASE 2 MG IJ SOLR
2.0000 mg | Freq: Once | INTRAMUSCULAR | Status: DC | PRN
  Filled 2017-12-04: qty 2

## 2017-12-04 MED ORDER — HEPARIN SOD (PORK) LOCK FLUSH 100 UNIT/ML IV SOLN
500.0000 [IU] | Freq: Once | INTRAVENOUS | Status: DC
  Filled 2017-12-04: qty 5

## 2017-12-04 MED ORDER — PALONOSETRON HCL INJECTION 0.25 MG/5ML
0.2500 mg | Freq: Once | INTRAVENOUS | Status: AC
  Administered 2017-12-04: 0.25 mg via INTRAVENOUS

## 2017-12-04 MED ORDER — SODIUM CHLORIDE 0.9 % IV SOLN
2000.0000 mg/m2 | INTRAVENOUS | Status: DC
  Administered 2017-12-04: 3300 mg via INTRAVENOUS
  Filled 2017-12-04: qty 66

## 2017-12-04 MED ORDER — DEXAMETHASONE SODIUM PHOSPHATE 10 MG/ML IJ SOLN
10.0000 mg | Freq: Once | INTRAMUSCULAR | Status: AC
  Administered 2017-12-04: 10 mg via INTRAVENOUS

## 2017-12-04 MED ORDER — DEXTROSE 5 % IV SOLN
400.0000 mg/m2 | Freq: Once | INTRAVENOUS | Status: AC
  Administered 2017-12-04: 664 mg via INTRAVENOUS
  Filled 2017-12-04: qty 33.2

## 2017-12-04 MED ORDER — DEXTROSE 5 % IV SOLN
Freq: Once | INTRAVENOUS | Status: AC
  Administered 2017-12-04: 12:00:00 via INTRAVENOUS

## 2017-12-04 NOTE — Telephone Encounter (Signed)
Tried calling regarding 12/29 change per sch msg to 1pm

## 2017-12-04 NOTE — Telephone Encounter (Signed)
Gave avs and calendar for January waiting for 1/10

## 2017-12-04 NOTE — Progress Notes (Signed)
  [facility] OFFICE PROGRESS NOTE   Diagnosis: Gastroesophageal carcinoma  INTERVAL HISTORY:   William Villegas returns as scheduled.  He completed cycle 1 FOLFOX 11/20/2017.  He denies nausea/vomiting.  No mouth sores.  No diarrhea.  He did not experience cold sensitivity.  Dysphagia is better.  Appetite has improved.  He is gaining weight.  Objective:  Vital signs in last 24 hours:  Blood pressure 131/67, pulse (!) 109, temperature (!) 97.5 F (36.4 C), temperature source Oral, resp. rate 18, height 5\' 9"  (1.753 m), weight 128 lb 6.4 oz (58.2 kg), SpO2 98 %.    HEENT: No thrush or ulcers. Resp: Lungs clear bilaterally. Cardio: Regular rate and rhythm. GI: Abdomen soft and nontender.  No hepatomegaly. Vascular: No leg edema. Port-A-Cath without erythema.  Lab Results:  Lab Results  Component Value Date   WBC 5.0 12/04/2017   HGB 8.6 (L) 12/04/2017   HCT 28.1 (L) 12/04/2017   MCV 85.2 12/04/2017   PLT 201 12/04/2017   NEUTROABS 3.1 12/04/2017    Imaging:  No results found.  Medications: I have reviewed the patient's current medications.  Assessment/Plan: 1. Gastroesophagealjunction adenocarcinoma  Upper endoscopy 10/28/2017 with findings of a partially obstructing likely malignant submucosal esophageal tumor at the gastroesophageal junction contiguous with a gastric cardia mass.  Biopsy of both the esophagus and gastric cardia showed poorly differentiated carcinoma  CT scans chest/abdomen/pelvis 11/03/2017 with a 4 x 6 cm mass at the gastroesophageal junction; diffuse hepatic metastases; mild paraesophageal and gastrohepatic ligament adenopathy; by 1.8 cm right adrenal mass  Biopsy liver lesion 11/14/2017-metastatic poorly differentiated adenocarcinoma  Cycle 1 FOLFOX 11/20/2017  Cycle 2 FOLFOX 12/04/2017 2. Dysphagia secondary to #1 3. Weight loss secondary to #1 4. COPD 5. Skin abscess mid back.  Resolving.  He completed a course of  Bactrim. 6. Port-A-Cath placement 11/14/2017 7. Iron deficiency anemia.  He received Feraheme 11/14/2017.   Disposition: William Villegas appears stable.  He has completed 1 cycle of FOLFOX and overall seems to have tolerated very well.  Plan to proceed with cycle 2 today as scheduled.  He will return for a follow-up visit and cycle 3 in 2 weeks.  He will contact the office in the interim with any problems.    Ned Card ANP/GNP-BC   12/04/2017  11:35 AM

## 2017-12-04 NOTE — Patient Instructions (Signed)
[facility] Discharge Instructions for Patients Receiving Chemotherapy  Today you received the following chemotherapy agents: Oxaliplatin, Leucovorin, and 5FU.  To help prevent nausea and vomiting after your treatment, we encourage you to take your nausea medication as directed.   If you develop nausea and vomiting that is not controlled by your nausea medication, call the clinic.   BELOW ARE SYMPTOMS THAT SHOULD BE REPORTED IMMEDIATELY:  *FEVER GREATER THAN 100.5 F  *CHILLS WITH OR WITHOUT FEVER  NAUSEA AND VOMITING THAT IS NOT CONTROLLED WITH YOUR NAUSEA MEDICATION  *UNUSUAL SHORTNESS OF BREATH  *UNUSUAL BRUISING OR BLEEDING  TENDERNESS IN MOUTH AND THROAT WITH OR WITHOUT PRESENCE OF ULCERS  *URINARY PROBLEMS  *BOWEL PROBLEMS  UNUSUAL RASH Items with * indicate a potential emergency and should be followed up as soon as possible.  Feel free to call the clinic should you have any questions or concerns. The clinic phone number is [phone].  Please show the CHEMO ALERT CARD at check-in to the Emergency Department and triage nurse.    

## 2017-12-05 ENCOUNTER — Ambulatory Visit: Payer: Medicare PPO

## 2017-12-05 ENCOUNTER — Ambulatory Visit: Payer: Medicare PPO | Admitting: Nurse Practitioner

## 2017-12-06 ENCOUNTER — Ambulatory Visit (HOSPITAL_BASED_OUTPATIENT_CLINIC_OR_DEPARTMENT_OTHER): Payer: Medicare PPO

## 2017-12-06 DIAGNOSIS — C160 Malignant neoplasm of cardia: Secondary | ICD-10-CM

## 2017-12-06 DIAGNOSIS — C159 Malignant neoplasm of esophagus, unspecified: Secondary | ICD-10-CM

## 2017-12-06 MED ORDER — SODIUM CHLORIDE 0.9% FLUSH
10.0000 mL | INTRAVENOUS | Status: DC | PRN
  Administered 2017-12-06: 10 mL
  Filled 2017-12-06: qty 10

## 2017-12-06 MED ORDER — HEPARIN SOD (PORK) LOCK FLUSH 100 UNIT/ML IV SOLN
500.0000 [IU] | Freq: Once | INTRAVENOUS | Status: AC | PRN
  Administered 2017-12-06: 500 [IU]
  Filled 2017-12-06: qty 5

## 2017-12-10 ENCOUNTER — Telehealth: Payer: Self-pay | Admitting: Oncology

## 2017-12-10 NOTE — Telephone Encounter (Signed)
Faxed office note to [employer]

## 2017-12-18 ENCOUNTER — Encounter: Payer: Self-pay | Admitting: Nurse Practitioner

## 2017-12-18 ENCOUNTER — Inpatient Hospital Stay: Payer: Medicare PPO

## 2017-12-18 ENCOUNTER — Other Ambulatory Visit: Payer: Self-pay | Admitting: Hematology

## 2017-12-18 ENCOUNTER — Inpatient Hospital Stay: Payer: Medicare PPO | Attending: Nurse Practitioner | Admitting: Nurse Practitioner

## 2017-12-18 VITALS — BP 122/73 | HR 111 | Temp 97.6°F | Resp 20 | Ht 69.0 in | Wt 127.1 lb

## 2017-12-18 DIAGNOSIS — L02212 Cutaneous abscess of back [any part, except buttock]: Secondary | ICD-10-CM | POA: Insufficient documentation

## 2017-12-18 DIAGNOSIS — R634 Abnormal weight loss: Secondary | ICD-10-CM | POA: Diagnosis not present

## 2017-12-18 DIAGNOSIS — Z87891 Personal history of nicotine dependence: Secondary | ICD-10-CM | POA: Diagnosis not present

## 2017-12-18 DIAGNOSIS — C787 Secondary malignant neoplasm of liver and intrahepatic bile duct: Secondary | ICD-10-CM | POA: Insufficient documentation

## 2017-12-18 DIAGNOSIS — Z9981 Dependence on supplemental oxygen: Secondary | ICD-10-CM | POA: Insufficient documentation

## 2017-12-18 DIAGNOSIS — D500 Iron deficiency anemia secondary to blood loss (chronic): Secondary | ICD-10-CM

## 2017-12-18 DIAGNOSIS — Z452 Encounter for adjustment and management of vascular access device: Secondary | ICD-10-CM | POA: Diagnosis not present

## 2017-12-18 DIAGNOSIS — C159 Malignant neoplasm of esophagus, unspecified: Secondary | ICD-10-CM

## 2017-12-18 DIAGNOSIS — R000 Tachycardia, unspecified: Secondary | ICD-10-CM | POA: Diagnosis not present

## 2017-12-18 DIAGNOSIS — C160 Malignant neoplasm of cardia: Secondary | ICD-10-CM | POA: Insufficient documentation

## 2017-12-18 DIAGNOSIS — D6481 Anemia due to antineoplastic chemotherapy: Secondary | ICD-10-CM | POA: Insufficient documentation

## 2017-12-18 DIAGNOSIS — R1310 Dysphagia, unspecified: Secondary | ICD-10-CM | POA: Insufficient documentation

## 2017-12-18 DIAGNOSIS — J449 Chronic obstructive pulmonary disease, unspecified: Secondary | ICD-10-CM | POA: Diagnosis not present

## 2017-12-18 DIAGNOSIS — D508 Other iron deficiency anemias: Secondary | ICD-10-CM | POA: Diagnosis not present

## 2017-12-18 DIAGNOSIS — T451X5A Adverse effect of antineoplastic and immunosuppressive drugs, initial encounter: Secondary | ICD-10-CM | POA: Diagnosis not present

## 2017-12-18 DIAGNOSIS — D72829 Elevated white blood cell count, unspecified: Secondary | ICD-10-CM | POA: Insufficient documentation

## 2017-12-18 DIAGNOSIS — Z5111 Encounter for antineoplastic chemotherapy: Secondary | ICD-10-CM | POA: Diagnosis not present

## 2017-12-18 DIAGNOSIS — D509 Iron deficiency anemia, unspecified: Secondary | ICD-10-CM | POA: Diagnosis not present

## 2017-12-18 LAB — CBC WITH DIFFERENTIAL (CANCER CENTER ONLY)
BASOS ABS: 0.1 10*3/uL (ref 0.0–0.1)
Basophils Relative: 0 %
EOS ABS: 0.3 10*3/uL (ref 0.0–0.5)
EOS PCT: 2 %
HCT: 26.9 % — ABNORMAL LOW (ref 38.4–49.9)
Hemoglobin: 8.5 g/dL — ABNORMAL LOW (ref 13.0–17.1)
Lymphocytes Relative: 8 %
Lymphs Abs: 1 10*3/uL (ref 0.9–3.3)
MCH: 26.4 pg — ABNORMAL LOW (ref 27.2–33.4)
MCHC: 31.7 g/dL — ABNORMAL LOW (ref 32.0–36.0)
MCV: 83.3 fL (ref 79.3–98.0)
Monocytes Absolute: 1.5 10*3/uL — ABNORMAL HIGH (ref 0.1–0.9)
Monocytes Relative: 12 %
Neutro Abs: 9.5 10*3/uL — ABNORMAL HIGH (ref 1.5–6.5)
Neutrophils Relative %: 78 %
PLATELETS: 222 10*3/uL (ref 140–400)
RBC: 3.23 MIL/uL — AB (ref 4.20–5.82)
RDW: 22.6 % — ABNORMAL HIGH (ref 11.0–15.6)
WBC: 12.2 10*3/uL — AB (ref 4.0–10.3)

## 2017-12-18 LAB — CMP (CANCER CENTER ONLY)
ALBUMIN: 2.7 g/dL — AB (ref 3.5–5.0)
ALK PHOS: 347 U/L — AB (ref 40–150)
ALT: 30 U/L (ref 0–55)
AST: 55 U/L — AB (ref 5–34)
Anion gap: 10 (ref 3–11)
BILIRUBIN TOTAL: 0.4 mg/dL (ref 0.2–1.2)
BUN: 30 mg/dL — AB (ref 7–26)
CALCIUM: 9.3 mg/dL (ref 8.4–10.4)
CO2: 26 mmol/L (ref 22–29)
CREATININE: 0.72 mg/dL (ref 0.70–1.30)
Chloride: 102 mmol/L (ref 98–109)
GFR, Est AFR Am: >60 mL/min (ref >60)
GLUCOSE: 87 mg/dL (ref 70–140)
Potassium: 4.5 mmol/L (ref 3.5–5.1)
Sodium: 138 mmol/L (ref 136–145)
TOTAL PROTEIN: 6.3 g/dL — AB (ref 6.4–8.3)

## 2017-12-18 LAB — URINALYSIS, COMPLETE (UACMP) WITH MICROSCOPIC
BILIRUBIN URINE: NEGATIVE
Bacteria, UA: NONE SEEN
Glucose, UA: NEGATIVE mg/dL
HGB URINE DIPSTICK: NEGATIVE
KETONES UR: NEGATIVE mg/dL
LEUKOCYTES UA: NEGATIVE
NITRITE: NEGATIVE
PH: 6 (ref 5.0–8.0)
Protein, ur: NEGATIVE mg/dL
SPECIFIC GRAVITY, URINE: 1.013 (ref 1.005–1.030)
SQUAMOUS EPITHELIAL / LPF: NONE SEEN

## 2017-12-18 LAB — CEA (IN HOUSE-CHCC): CEA (CHCC-In House): 1.43 ng/mL (ref 0.00–5.00)

## 2017-12-18 MED ORDER — LEUCOVORIN CALCIUM INJECTION 350 MG
400.0000 mg/m2 | Freq: Once | INTRAVENOUS | Status: AC
  Administered 2017-12-18: 664 mg via INTRAVENOUS
  Filled 2017-12-18: qty 33.2

## 2017-12-18 MED ORDER — PALONOSETRON HCL INJECTION 0.25 MG/5ML
INTRAVENOUS | Status: AC
  Filled 2017-12-18: qty 5

## 2017-12-18 MED ORDER — DEXAMETHASONE SODIUM PHOSPHATE 10 MG/ML IJ SOLN
10.0000 mg | Freq: Once | INTRAMUSCULAR | Status: AC
  Administered 2017-12-18: 10 mg via INTRAVENOUS

## 2017-12-18 MED ORDER — DEXTROSE 5 % IV SOLN
Freq: Once | INTRAVENOUS | Status: AC
  Administered 2017-12-18: 11:00:00 via INTRAVENOUS

## 2017-12-18 MED ORDER — PALONOSETRON HCL INJECTION 0.25 MG/5ML
0.2500 mg | Freq: Once | INTRAVENOUS | Status: AC
  Administered 2017-12-18: 0.25 mg via INTRAVENOUS

## 2017-12-18 MED ORDER — DEXAMETHASONE SODIUM PHOSPHATE 10 MG/ML IJ SOLN
INTRAMUSCULAR | Status: AC
  Filled 2017-12-18: qty 1

## 2017-12-18 MED ORDER — SODIUM CHLORIDE 0.9 % IV SOLN
2400.0000 mg/m2 | INTRAVENOUS | Status: DC
  Administered 2017-12-18: 4000 mg via INTRAVENOUS
  Filled 2017-12-18: qty 80

## 2017-12-18 MED ORDER — OXALIPLATIN CHEMO INJECTION 100 MG/20ML
89.0000 mg/m2 | Freq: Once | INTRAVENOUS | Status: AC
  Administered 2017-12-18: 150 mg via INTRAVENOUS
  Filled 2017-12-18: qty 20

## 2017-12-18 NOTE — Patient Instructions (Signed)
[facility] Discharge Instructions for Patients Receiving Chemotherapy  Today you received the following chemotherapy agents: Oxaliplatin, Leucovorin, and 5FU.  To help prevent nausea and vomiting after your treatment, we encourage you to take your nausea medication as directed.   If you develop nausea and vomiting that is not controlled by your nausea medication, call the clinic.   BELOW ARE SYMPTOMS THAT SHOULD BE REPORTED IMMEDIATELY:  *FEVER GREATER THAN 100.5 F  *CHILLS WITH OR WITHOUT FEVER  NAUSEA AND VOMITING THAT IS NOT CONTROLLED WITH YOUR NAUSEA MEDICATION  *UNUSUAL SHORTNESS OF BREATH  *UNUSUAL BRUISING OR BLEEDING  TENDERNESS IN MOUTH AND THROAT WITH OR WITHOUT PRESENCE OF ULCERS  *URINARY PROBLEMS  *BOWEL PROBLEMS  UNUSUAL RASH Items with * indicate a potential emergency and should be followed up as soon as possible.  Feel free to call the clinic should you have any questions or concerns. The clinic phone number is [phone].  Please show the CHEMO ALERT CARD at check-in to the Emergency Department and triage nurse.    

## 2017-12-18 NOTE — Progress Notes (Signed)
[place]  [phone] [phone]  Clinic Follow up Note   Patient Care Team: Susy Frizzle, MD as PCP - General (Family Medicine) 12/18/2017  CHIEF COMPLAINT: Follow up esophageal cancer   SUMMARY OF ONCOLOGIC HISTORY:   Esophageal cancer, stage IV ([place])   10/28/2017 Initial Diagnosis    Esophageal cancer, stage IV ([place])      10/28/2017 Procedure    Upper endoscopy on 10/28/2017 with findings of a partially obstructing likely malignant submucosal esophageal tumor at the gastroesophageal junction contiguous with a gastric cardia mass.        10/28/2017 Initial Biopsy    Biopsy of both the esophagus and gastric cardia showed poorly differentiated carcinoma.        11/03/2017 Imaging    CT scans 11/03/2017 showed a 4 x 6 cm mass at the gastroesophageal junction, diffuse hepatic metastases, mild paraesophageal and gastrohepatic ligament adenopathy and a new 1.8 cm right adrenal mass.        11/20/2017 -  Chemotherapy    Began FOLFOX chemotherapy q2 weeks     CURRENT THERAPY: FOLFOX chemotherapy q2 weeks began 11/20/17  INTERVAL HISTORY: Mr. William Villegas returns today for follow-up as scheduled prior to cycle 3 FOLFOX.  He received cycle 2 on 12/04/2017 without difficulty.  Denies nausea, vomiting, constipation, diarrhea, or mouth sores.  Dysphasia is improving, he tolerates solids and liquids at room temperature.  Denies cold sensitivity.  Denies fever or chills, cough, chest pain, shortness of breath; occasionally wheezes on exertion which is normal for him.  Denies fatigue.   REVIEW OF SYSTEMS:   Constitutional: Denies fatigue, fevers, chills  Eyes: Denies blurriness of vision Ears, nose, mouth, throat, and face: Denies mucositis or sore throat Respiratory: Denies cough, dyspnea (+) occasionally wheezes on exertion, uses home O2 PRN Cardiovascular: Denies palpitation, chest discomfort or lower extremity  swelling Gastrointestinal:  Denies nausea, vomiting, constipation, diarrhea, heartburn, abdominal pain, or change in bowel habits (+) dysphagia, improving GU: Denies dysuria Skin: Denies abnormal skin rashes (+) healing abscess on back Lymphatics: Denies new lymphadenopathy or easy bruising Neurological:Denies numbness, tingling or new weaknesses.  Denies cold sensitivity Behavioral/Psych: Mood is stable, no new changes  All other systems were reviewed with the patient and are negative.  MEDICAL HISTORY:  Past Medical History:  Diagnosis Date  . COPD (chronic obstructive pulmonary disease) ([place])   . Hypertension     SURGICAL HISTORY: Past Surgical History:  Procedure Laterality Date  . ESOPHAGOGASTRODUODENOSCOPY (EGD) WITH PROPOFOL N/A 10/28/2017   Procedure: ESOPHAGOGASTRODUODENOSCOPY (EGD) WITH PROPOFOL;  Surgeon: Ladene Artist, MD;  Location: [facility];  Service: Endoscopy;  Laterality: N/A;  . IR FLUORO GUIDE PORT INSERTION RIGHT  11/14/2017  . IR US GUIDE VASC ACCESS RIGHT  11/14/2017  . SAVORY DILATION N/A 10/28/2017   Procedure: POSSIBLE SAVORY DILATION;  Surgeon: Ladene Artist, MD;  Location: [facility];  Service: Endoscopy;  Laterality: N/A;    I have reviewed the social history and family history with the patient and they are unchanged from previous note.  ALLERGIES:  has No Known Allergies.  MEDICATIONS:  Current Outpatient Medications  Medication Sig Dispense Refill  . aspirin 81 MG tablet Take 81 mg by mouth daily.    Marland Kitchen lidocaine-prilocaine (EMLA) cream Apply to portacath site 1-2 hours prior to use. 30 g 2  . mirtazapine (REMERON) 7.5 MG tablet Take 1 tablet (7.5 mg total) by mouth at bedtime. 30 tablet 1  . OXYGEN Inhale 2 L  as needed into the lungs (for shortness of breath).     . pantoprazole (PROTONIX) 40 MG tablet Take 40 mg 2 (two) times daily by mouth.  3  . umeclidinium-vilanterol (ANORO ELLIPTA) 62.5-25 MCG/INH AEPB Inhale 1 puff into the lungs  daily. 30 each 0  . ondansetron (ZOFRAN) 8 MG tablet Take 1 tablet (8 mg total) by mouth 2 (two) times daily as needed for refractory nausea / vomiting. Start on day 3 after chemotherapy. 30 tablet 1  . prochlorperazine (COMPAZINE) 10 MG tablet Take 1 tablet (10 mg total) by mouth every 6 (six) hours as needed for nausea or vomiting. 30 tablet 0   No current facility-administered medications for this visit.    Facility-Administered Medications Ordered in Other Visits  Medication Dose Route Frequency Provider Last Rate Last Dose  . fluorouracil (ADRUCIL) 4,000 mg in sodium chloride 0.9 % 70 mL chemo infusion  2,400 mg/m2 (Treatment Plan Recorded) Intravenous 1 day or 1 dose Truitt Merle, MD      . leucovorin 664 mg in dextrose 5 % 250 mL infusion  400 mg/m2 (Treatment Plan Recorded) Intravenous Once Truitt Merle, MD 142 mL/hr at 12/18/17 1135 664 mg at 12/18/17 1135  . oxaliplatin (ELOXATIN) 150 mg in dextrose 5 % 500 mL chemo infusion  89 mg/m2 (Treatment Plan Recorded) Intravenous Once Truitt Merle, MD 265 mL/hr at 12/18/17 1134 150 mg at 12/18/17 1134    PHYSICAL EXAMINATION: ECOG PERFORMANCE STATUS: 1 - Symptomatic but completely ambulatory  Vitals:   12/18/17 1008  BP: 122/73  Pulse: (!) 111  Resp: 20  Temp: 97.6 F (36.4 C)  SpO2: 97%   Filed Weights   12/18/17 1008  Weight: 127 lb 1.6 oz (57.7 kg)    GENERAL:alert, no distress and comfortable SKIN: skin color, texture, turgor are normal, no rashes or significant lesions EYES: normal, conjunctiva are pink and non-injected, sclera clear OROPHARYNX:no exudate, no erythema and lips, buccal mucosa, and tongue normal  NECK: supple, thyroid normal size, non-tender, without nodularity LYMPH:  no palpable cervical, supraclavicular, or axillary lymphadenopathy LUNGS: clear to auscultation bilaterally with normal breathing effort HEART: (+) mild regular rate & rhythm and no murmurs and no lower extremity edema ABDOMEN:abdomen soft, non-tender  and normal bowel sounds. No hepatomegaly Musculoskeletal:no cyanosis of digits and no clubbing  NEURO: alert & oriented x 3 with fluent speech, no focal motor/sensory deficits PAC without erythema   LABORATORY DATA:  I have reviewed the data as listed CBC Latest Ref Rng & Units 12/18/2017 12/04/2017 11/20/2017  WBC 4.0 - 10.3 10e3/uL - 5.0 7.2  Hemoglobin 13.0 - 17.1 g/dL - 8.6(L) 8.4(L)  Hematocrit 38.4 - 49.9 % 26.9(L) 28.1(L) 27.8(L)  Platelets 140 - 400 10e3/uL - 201 257     CMP Latest Ref Rng & Units 12/18/2017 12/04/2017 11/20/2017  Glucose 70 - 140 mg/dL 87 84 94  BUN 7 - 26 mg/dL 30(H) 21.4 55.5(H)  Creatinine 0.7 - 1.3 mg/dL - 0.7 1.0  Sodium 136 - 145 mmol/L 138 141 139  Potassium 3.5 - 5.1 mmol/L 4.5 4.3 4.2  Chloride 98 - 109 mmol/L 102 - -  CO2 22 - 29 mmol/L 26 26 21(L)  Calcium 8.4 - 10.4 mg/dL 9.3 8.9 9.5  Total Protein 6.4 - 8.3 g/dL 6.3(L) 5.8(L) 6.6  Total Bilirubin 0.2 - 1.2 mg/dL 0.4 0.36 0.26  Alkaline Phos 40 - 150 U/L 347(H) 287(H) 221(H)  AST 5 - 34 U/L 55(H) 35(H) 43(H)  ALT 0 - 55 U/L  30 22 31    PATHOLOGY REPORT  Diagnosis 1. Stomach, biopsy, gastric cardia mass - POORLY DIFFERENTIATED CARCINOMA, SEE COMMENT. 2. Esophagus, biopsy, distal - POORLY DIFFERENTIATED CARCINOMA. Microscopic Comment 1. , 2. Both biopsies show similar features although tumor fragments are more abundant in specimen #1. The appearance favors adenocarcinoma but it is unclear if this is arising in stomach or esophagus based on this material. Dr Saralyn Pilar reviewed this case and concurs. The results were discussed with Dr Fuller Plan on 10/29/2017. (BNS:ecj 10/29/2017)   RADIOGRAPHIC STUDIES: I have personally reviewed the radiological images as listed and agreed with the findings in the report. No results found.   1. ASSESSMENT & PLAN: Gastroesophageal junction adenocarcinoma   Upper endoscopy 10/28/2017 with findings of a partially obstructing likely malignant submucosal  esophageal tumor at the gastroesophageal junction contiguous with a gastric cardia mass.  Biopsy of both the esophagus and gastric cardia showed poorly differentiated carcinoma  CT scans chest/abdomen/pelvis 11/03/2017 with a 4 x 6 cm mass at the gastroesophageal junction; diffuse hepatic metastases; mild paraesophageal and gastrohepatic ligament adenopathy; by 1.8 cm right adrenal mass 2. Dysphagia secondary to #1 3. Weight loss secondary to #1 4. COPD 5. Skin abscess mid back   Mr. Mckelvy appears stable today, he has completed 2 cycles of FOLFOX chemotherapy without difficulty.  He is tolerating well.  Labs reviewed, continues to have mild anemia secondary to iron deficiency and chemotherapy, Hgb 8.5 today.  Received IV feraheme x2 in December 2018. will check iron studies at next visit. He does not require blood transfusion at this time, will continue to monitor.  Mild leukocytosis WBC 12.2.  He did not receive Neulasta.  Elevated white count likely multifactorial.  His exam is within normal limits, afebrile.  Will check UA today to rule out urinary tract infection.  He is mildly tachycardic, HR 111; he attributes to being nervous, I suspect from mild dehydration as he only drinks 2 sprites per day and nothing else. May also be component of his anemia. I Encouraged him to drink more water, goal is 8 cups/day of decaffeinated drink.  He agrees. Cmet with elevated LFTs, mostly stable, adequate for treatment.  He will proceed with cycle 3 FOLFOX today and continue q2 weeks.  He will return in 2 weeks for lab and follow-up with Dr. Burr Medico and next cycle.  PLAN -UA to rule out UTI, will f/u  -Labs reviewed, proceed with cycle 3 FOLFOX today -Return in 2 weeks for lab and f/u with Dr. Burr Medico -Increase po liquid intake -Check iron studies at next visit    Orders Placed This Encounter  Procedures  . Urinalysis, Complete w Microscopic    Standing Status:   Future    Standing Expiration Date:    12/18/2018   All questions were answered. The patient knows to call the clinic with any problems, questions or concerns. No barriers to learning was detected.    Alla Feeling, NP 12/18/17

## 2017-12-19 ENCOUNTER — Encounter: Payer: Self-pay | Admitting: Pharmacy Technician

## 2017-12-19 NOTE — Progress Notes (Signed)
The patient is approved for drug assistance by [employer] for Feraheme. Enrollment covers 12/18/17 - 12/18/18 contingent upon the patient providing income verification. Enrollment is based on insurance denial. The first DOS's covered by drug assistance is 11/14/17 and 11/20/17.

## 2017-12-20 ENCOUNTER — Inpatient Hospital Stay: Payer: Medicare PPO

## 2017-12-20 VITALS — BP 116/63 | HR 109 | Temp 98.0°F | Resp 20

## 2017-12-20 DIAGNOSIS — C159 Malignant neoplasm of esophagus, unspecified: Secondary | ICD-10-CM

## 2017-12-20 DIAGNOSIS — Z5111 Encounter for antineoplastic chemotherapy: Secondary | ICD-10-CM | POA: Diagnosis not present

## 2017-12-20 MED ORDER — SODIUM CHLORIDE 0.9% FLUSH
10.0000 mL | INTRAVENOUS | Status: DC | PRN
  Administered 2017-12-20: 10 mL
  Filled 2017-12-20: qty 10

## 2017-12-20 MED ORDER — HEPARIN SOD (PORK) LOCK FLUSH 100 UNIT/ML IV SOLN
500.0000 [IU] | Freq: Once | INTRAVENOUS | Status: AC | PRN
  Administered 2017-12-20: 500 [IU]
  Filled 2017-12-20: qty 5

## 2017-12-22 ENCOUNTER — Telehealth: Payer: Self-pay | Admitting: Nurse Practitioner

## 2017-12-22 NOTE — Telephone Encounter (Signed)
Scheduled appt per 1/10 los - unable to schedule f/u with YF on 2/7 - scheduled with APP - Patient to get an updated schedule next visit.

## 2017-12-23 ENCOUNTER — Encounter: Payer: Self-pay | Admitting: Nutrition

## 2017-12-23 NOTE — Progress Notes (Signed)
Patient cancelled nutrition appointments. States he doesn't want to talk about food and doesn't have time for "all that stuff". Nutrition appointments cancelled.

## 2017-12-25 ENCOUNTER — Encounter: Payer: Medicare PPO | Admitting: Nutrition

## 2018-01-01 ENCOUNTER — Encounter: Payer: Self-pay | Admitting: Hematology

## 2018-01-01 ENCOUNTER — Inpatient Hospital Stay (HOSPITAL_BASED_OUTPATIENT_CLINIC_OR_DEPARTMENT_OTHER): Payer: Medicare PPO | Admitting: Hematology

## 2018-01-01 ENCOUNTER — Inpatient Hospital Stay: Payer: Medicare PPO

## 2018-01-01 ENCOUNTER — Encounter: Payer: Medicare PPO | Admitting: Nutrition

## 2018-01-01 ENCOUNTER — Telehealth: Payer: Self-pay | Admitting: Hematology

## 2018-01-01 VITALS — BP 120/73 | HR 107 | Temp 98.5°F | Resp 18 | Ht 69.0 in | Wt 125.5 lb

## 2018-01-01 DIAGNOSIS — C159 Malignant neoplasm of esophagus, unspecified: Secondary | ICD-10-CM

## 2018-01-01 DIAGNOSIS — I10 Essential (primary) hypertension: Secondary | ICD-10-CM | POA: Diagnosis not present

## 2018-01-01 DIAGNOSIS — D500 Iron deficiency anemia secondary to blood loss (chronic): Secondary | ICD-10-CM

## 2018-01-01 DIAGNOSIS — Z9981 Dependence on supplemental oxygen: Secondary | ICD-10-CM | POA: Diagnosis not present

## 2018-01-01 DIAGNOSIS — R634 Abnormal weight loss: Secondary | ICD-10-CM | POA: Diagnosis not present

## 2018-01-01 DIAGNOSIS — C787 Secondary malignant neoplasm of liver and intrahepatic bile duct: Secondary | ICD-10-CM

## 2018-01-01 DIAGNOSIS — Z87891 Personal history of nicotine dependence: Secondary | ICD-10-CM

## 2018-01-01 DIAGNOSIS — D509 Iron deficiency anemia, unspecified: Secondary | ICD-10-CM

## 2018-01-01 DIAGNOSIS — C160 Malignant neoplasm of cardia: Secondary | ICD-10-CM

## 2018-01-01 DIAGNOSIS — J449 Chronic obstructive pulmonary disease, unspecified: Secondary | ICD-10-CM

## 2018-01-01 DIAGNOSIS — Z5111 Encounter for antineoplastic chemotherapy: Secondary | ICD-10-CM | POA: Diagnosis not present

## 2018-01-01 LAB — CBC WITH DIFFERENTIAL (CANCER CENTER ONLY)
BASOS ABS: 0 10*3/uL (ref 0.0–0.1)
Basophils Relative: 0 %
EOS PCT: 4 %
Eosinophils Absolute: 0.3 10*3/uL (ref 0.0–0.5)
HEMATOCRIT: 25.8 % — AB (ref 38.4–49.9)
Hemoglobin: 8.1 g/dL — ABNORMAL LOW (ref 13.0–17.1)
LYMPHS ABS: 0.8 10*3/uL — AB (ref 0.9–3.3)
LYMPHS PCT: 11 %
MCH: 27 pg — AB (ref 27.2–33.4)
MCHC: 31.6 g/dL — AB (ref 32.0–36.0)
MCV: 85.4 fL (ref 79.3–98.0)
MONO ABS: 1.1 10*3/uL — AB (ref 0.1–0.9)
MONOS PCT: 15 %
NEUTROS ABS: 5 10*3/uL (ref 1.5–6.5)
Neutrophils Relative %: 70 %
Platelet Count: 183 10*3/uL (ref 140–400)
RBC: 3.02 MIL/uL — ABNORMAL LOW (ref 4.20–5.82)
RDW: 23.4 % — ABNORMAL HIGH (ref 11.0–15.6)
WBC Count: 7.2 10*3/uL (ref 4.0–10.3)

## 2018-01-01 LAB — CMP (CANCER CENTER ONLY)
ALBUMIN: 2.6 g/dL — AB (ref 3.5–5.0)
ALT: 20 U/L (ref 0–55)
AST: 44 U/L — AB (ref 5–34)
Alkaline Phosphatase: 281 U/L — ABNORMAL HIGH (ref 40–150)
Anion gap: 8 (ref 3–11)
BILIRUBIN TOTAL: 0.3 mg/dL (ref 0.2–1.2)
BUN: 30 mg/dL — AB (ref 7–26)
CHLORIDE: 104 mmol/L (ref 98–109)
CO2: 27 mmol/L (ref 22–29)
Calcium: 9.3 mg/dL (ref 8.4–10.4)
Creatinine: 0.74 mg/dL (ref 0.70–1.30)
GFR, Est AFR Am: >60 mL/min (ref >60)
GFR, Estimated: >60 mL/min (ref >60)
GLUCOSE: 87 mg/dL (ref 70–140)
Potassium: 3.9 mmol/L (ref 3.5–5.1)
SODIUM: 139 mmol/L (ref 136–145)
TOTAL PROTEIN: 6 g/dL — AB (ref 6.4–8.3)

## 2018-01-01 LAB — FERRITIN: Ferritin: 862 ng/mL — ABNORMAL HIGH (ref 22–316)

## 2018-01-01 MED ORDER — LEUCOVORIN CALCIUM INJECTION 350 MG
400.0000 mg/m2 | Freq: Once | INTRAVENOUS | Status: AC
  Administered 2018-01-01: 664 mg via INTRAVENOUS
  Filled 2018-01-01: qty 33.2

## 2018-01-01 MED ORDER — DEXAMETHASONE SODIUM PHOSPHATE 10 MG/ML IJ SOLN
10.0000 mg | Freq: Once | INTRAMUSCULAR | Status: AC
  Administered 2018-01-01: 10 mg via INTRAVENOUS

## 2018-01-01 MED ORDER — DEXAMETHASONE SODIUM PHOSPHATE 10 MG/ML IJ SOLN
INTRAMUSCULAR | Status: AC
  Filled 2018-01-01: qty 1

## 2018-01-01 MED ORDER — OXALIPLATIN CHEMO INJECTION 100 MG/20ML
150.0000 mg | Freq: Once | INTRAVENOUS | Status: AC
  Administered 2018-01-01: 150 mg via INTRAVENOUS
  Filled 2018-01-01: qty 20

## 2018-01-01 MED ORDER — HEPARIN SOD (PORK) LOCK FLUSH 100 UNIT/ML IV SOLN
500.0000 [IU] | Freq: Once | INTRAVENOUS | Status: DC | PRN
Start: 2018-01-01 — End: 2018-01-01
  Filled 2018-01-01: qty 5

## 2018-01-01 MED ORDER — SODIUM CHLORIDE 0.9% FLUSH
10.0000 mL | INTRAVENOUS | Status: DC | PRN
  Administered 2018-01-01: 10 mL
  Filled 2018-01-01: qty 10

## 2018-01-01 MED ORDER — SODIUM CHLORIDE 0.9% FLUSH
10.0000 mL | INTRAVENOUS | Status: DC | PRN
  Filled 2018-01-01: qty 10

## 2018-01-01 MED ORDER — DEXTROSE 5 % IV SOLN
Freq: Once | INTRAVENOUS | Status: AC
  Administered 2018-01-01: 12:00:00 via INTRAVENOUS

## 2018-01-01 MED ORDER — SODIUM CHLORIDE 0.9 % IV SOLN
2400.0000 mg/m2 | INTRAVENOUS | Status: DC
  Administered 2018-01-01: 4000 mg via INTRAVENOUS
  Filled 2018-01-01: qty 80

## 2018-01-01 MED ORDER — PALONOSETRON HCL INJECTION 0.25 MG/5ML
INTRAVENOUS | Status: AC
  Filled 2018-01-01: qty 5

## 2018-01-01 MED ORDER — PALONOSETRON HCL INJECTION 0.25 MG/5ML
0.2500 mg | Freq: Once | INTRAVENOUS | Status: AC
  Administered 2018-01-01: 0.25 mg via INTRAVENOUS

## 2018-01-01 NOTE — Telephone Encounter (Signed)
Gave avs and calendar for February and march °

## 2018-01-01 NOTE — Progress Notes (Signed)
[place]  [phone] [phone]) [phone]  Clinic Follow Up Note   Patient Care Team: Susy Frizzle, MD as PCP - General (Family Medicine)   Date of Service:  01/01/2018   CHIEF COMPLAINTS:  Gastroesophageal carcinoma  HISTORY OF PRESENTING ILLNESS: 11/05/17 William Villegas 72 y.o. male is here because of recent diagnosis of gastroesophageal carcinoma.  He initially presented with dysphagia and weight loss.  He was found to have iron deficiency anemia.  He underwent an upper endoscopy on 10/28/2017 with findings of a partially obstructing likely malignant submucosal esophageal tumor at the gastroesophageal junction contiguous with a gastric cardia mass.  Biopsy of both the esophagus and gastric cardia showed poorly differentiated carcinoma.  CT scans 11/03/2017 showed a 4 x 6 cm mass at the gastroesophageal junction, diffuse hepatic metastases, mild paraesophageal and gastrohepatic ligament adenopathy and a new 1.8 cm right adrenal mass.   CURRENT THERAPY : first line chemo FOLFOX every 2 weeks starting 11/20/17    INTERVAL HISTORY  William Villegas is here for a follow up and 4th cycle FOLFOX. He presents to the clinic today accompanied by his family member. He notes he feels great. He eats anything he wants and is gaining weight. He denies any dysphagia. He has been drinking ensure 2-4 cans a day. He denies any other pain, bloating or stomach issues. His energy level is well. He does not go out much since before cancer diagnosis.  He lives with his son. He is able to do most things at home for himself. His lump on his back is gone now.      MEDICAL HISTORY:  Past Medical History:  Diagnosis Date  . COPD (chronic obstructive pulmonary disease) ([place])   . Hypertension     SURGICAL HISTORY: Past Surgical History:  Procedure Laterality Date  . ESOPHAGOGASTRODUODENOSCOPY (EGD) WITH PROPOFOL N/A 10/28/2017   Procedure: ESOPHAGOGASTRODUODENOSCOPY  (EGD) WITH PROPOFOL;  Surgeon: Ladene Artist, MD;  Location: [facility];  Service: Endoscopy;  Laterality: N/A;  . IR FLUORO GUIDE PORT INSERTION RIGHT  11/14/2017  . IR US GUIDE VASC ACCESS RIGHT  11/14/2017  . SAVORY DILATION N/A 10/28/2017   Procedure: POSSIBLE SAVORY DILATION;  Surgeon: Ladene Artist, MD;  Location: [facility];  Service: Endoscopy;  Laterality: N/A;    SOCIAL HISTORY: Social History   Socioeconomic History  . Marital status: Divorced    Spouse name: Not on file  . Number of children: Not on file  . Years of education: Not on file  . Highest education level: Not on file  Social Needs  . Financial resource strain: Not on file  . Food insecurity - worry: Not on file  . Food insecurity - inability: Not on file  . Transportation needs - medical: Not on file  . Transportation needs - non-medical: Not on file  Occupational History  . Not on file  Tobacco Use  . Smoking status: Former Smoker    Packs/day: 0.50    Years: 25.00    Pack years: 12.50    Types: Cigarettes    Last attempt to quit: 11/08/2016    Years since quitting: 1.1  . Smokeless tobacco: Never Used  Substance and Sexual Activity  . Alcohol use: No  . Drug use: No  . Sexual activity: Not on file  Other Topics Concern  . Not on file  Social History Narrative  . Not on file    FAMILY HISTORY: Sister deceased with "cancer", type unknown.  ALLERGIES:  has No Known Allergies.  MEDICATIONS:  Current Outpatient Medications  Medication Sig Dispense Refill  . aspirin 81 MG tablet Take 81 mg by mouth daily.    Marland Kitchen lidocaine-prilocaine (EMLA) cream Apply to portacath site 1-2 hours prior to use. 30 g 2  . mirtazapine (REMERON) 7.5 MG tablet Take 1 tablet (7.5 mg total) by mouth at bedtime. 30 tablet 1  . ondansetron (ZOFRAN) 8 MG tablet Take 1 tablet (8 mg total) by mouth 2 (two) times daily as needed for refractory nausea / vomiting. Start on day 3 after chemotherapy. 30 tablet 1  . OXYGEN  Inhale 2 L as needed into the lungs (for shortness of breath).     . pantoprazole (PROTONIX) 40 MG tablet Take 40 mg 2 (two) times daily by mouth.  3  . prochlorperazine (COMPAZINE) 10 MG tablet Take 1 tablet (10 mg total) by mouth every 6 (six) hours as needed for nausea or vomiting. 30 tablet 0  . umeclidinium-vilanterol (ANORO ELLIPTA) 62.5-25 MCG/INH AEPB Inhale 1 puff into the lungs daily. 30 each 0   No current facility-administered medications for this visit.    Facility-Administered Medications Ordered in Other Visits  Medication Dose Route Frequency Provider Last Rate Last Dose  . fluorouracil (ADRUCIL) 4,000 mg in sodium chloride 0.9 % 70 mL chemo infusion  2,400 mg/m2 (Treatment Plan Recorded) Intravenous 1 day or 1 dose Truitt Merle, MD      . heparin lock flush 100 unit/mL  500 Units Intracatheter Once PRN Truitt Merle, MD      . leucovorin 664 mg in dextrose 5 % 250 mL infusion  400 mg/m2 (Treatment Plan Recorded) Intravenous Once Truitt Merle, MD 142 mL/hr at 01/01/18 1257 664 mg at 01/01/18 1257  . oxaliplatin (ELOXATIN) 150 mg in dextrose 5 % 500 mL chemo infusion  150 mg Intravenous Once Truitt Merle, MD 265 mL/hr at 01/01/18 1256 150 mg at 01/01/18 1256  . sodium chloride flush (NS) 0.9 % injection 10 mL  10 mL Intracatheter PRN Truitt Merle, MD        REVIEW OF SYSTEMS:   Constitutional: Denies fevers, chills or abnormal night sweats.  (+) Weight gain with normal appetite  Eyes: Denies blurriness of vision, double vision or watery eyes Ears, nose, mouth, throat, and face: Denies mucositis or sore throat Respiratory: Denies cough, dyspnea or wheezes Cardiovascular: Denies palpitation, chest discomfort or lower extremity swelling Gastrointestinal: (+) 34-month history of dysphagia, now resolved. No odynophagia.  He is tolerating liquids and soft solids.  Denies nausea, heartburn or change in bowel habits Skin: Denies abnormal skin rashes; notes that he bruises easily. Lymphatics: Denies new  lymphadenopathy or easy bruising Neurological:Denies numbness, tingling or new weaknesses Behavioral/Psych: Mood is stable, no new changes  All other systems were reviewed with the patient and are negative.  PHYSICAL EXAMINATION: ECOG PERFORMANCE STATUS: 2-3  Vitals:   01/01/18 1051  BP: 120/73  Pulse: (!) 107  Resp: 18  Temp: 98.5 F (36.9 C)  SpO2: 97%   Filed Weights   01/01/18 1051  Weight: 125 lb 8 oz (56.9 kg)    GENERAL:alert, no distress and comfortable SKIN: skin color, texture, turgor are normal, no rashes or significant lesions, (+) skin hyperpigmentation from the previous skin infection in the mid/low back EYES: normal, conjunctiva are pink and non-injected, sclera clear OROPHARYNX:no exudate, no erythema and lips, buccal mucosa, and tongue normal  NECK: supple, thyroid normal size, non-tender, without nodularity LYMPH:  no palpable lymphadenopathy  in the cervical, axillary or inguinal LUNGS: clear to auscultation and percussion  (+) breathing sounds low on left side.  HEART: regular rate & rhythm and no murmurs and no lower extremity edema ABDOMEN:abdomen soft, non-tender and normal bowel sounds Musculoskeletal:no cyanosis of digits and no clubbing  PSYCH: alert & oriented x 3 with fluent speech NEURO: no focal motor/sensory deficits  LABORATORY DATA:  I have reviewed the data as listed CBC Latest Ref Rng & Units 01/01/2018 12/18/2017 12/04/2017  WBC 4.0 - 10.3 K/uL 7.2 12.2(H) 5.0  Hemoglobin 13.0 - 17.1 g/dL - - 8.6(L)  Hematocrit 38.4 - 49.9 % 25.8(L) 26.9(L) 28.1(L)  Platelets 140 - 400 K/uL 183 222 201   CMP Latest Ref Rng & Units 01/01/2018 12/18/2017 12/04/2017  Glucose 70 - 140 mg/dL 87 87 84  BUN 7 - 26 mg/dL 30(H) 30(H) 21.4  Creatinine 0.7 - 1.3 mg/dL - - 0.7  Sodium 136 - 145 mmol/L 139 138 141  Potassium 3.5 - 5.1 mmol/L 3.9 4.5 4.3  Chloride 98 - 109 mmol/L 104 102 -  CO2 22 - 29 mmol/L 27 26 26   Calcium 8.4 - 10.4 mg/dL 9.3 9.3 8.9  Total  Protein 6.4 - 8.3 g/dL 6.0(L) 6.3(L) 5.8(L)  Total Bilirubin 0.2 - 1.2 mg/dL 0.3 0.4 0.36  Alkaline Phos 40 - 150 U/L 281(H) 347(H) 287(H)  AST 5 - 34 U/L 44(H) 55(H) 35(H)  ALT 0 - 55 U/L 20 30 22     PATHOLOGY REPORT  Diagnosis 1. Stomach, biopsy, gastric cardia mass - POORLY DIFFERENTIATED CARCINOMA, SEE COMMENT. 2. Esophagus, biopsy, distal - POORLY DIFFERENTIATED CARCINOMA. Microscopic Comment 1. , 2. Both biopsies show similar features although tumor fragments are more abundant in specimen #1. The appearance favors adenocarcinoma but it is unclear if this is arising in stomach or esophagus based on this material. Dr Saralyn Pilar reviewed this case and concurs. The results were discussed with Dr Fuller Plan on 10/29/2017. (BNS:ecj 10/29/2017)  RADIOGRAPHIC STUDIES: I have personally reviewed the radiological images as listed and agreed with the findings in the report. No results found.  ASSESSMENT & PLAN:  Mr Chuba is a 72 y.o. male with past medical history of COPD, not on oxygen, hypertension, presented with progressive dysphagia   1. 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  -Upper endoscopy 10/28/2017 with findings of a partially obstructing likely malignant submucosal esophageal tumor at the gastroesophageal junction contiguous with a gastric cardia mass. -Biopsy of both the esophagus and gastric cardia showed poorly differentiated carcinoma -CT scans chest/abdomen/pelvis 11/03/2017 with a 4 x 6 cm mass at the gastroesophageal junction; diffuse hepatic metastases; mild paraesophageal and gastrohepatic ligament adenopathy; by 1.8 cm right adrenal mass -I recommend IR liver biopsy to confirm metastasis, port placement for chemotherapy.   -He started first line FOLFOX for every 2 weeks on 11/20/17. He has tolerated chemo without difficulty so far.  His dysphasia and overall condition has  much improved since he started chemo.  He is clinically responding to treatment. -Labs reviewed today and adequate to proceed with FOLFOX today, cycle 4 -His Hg is borderline low, but given he is asymptomatic, I will hold on giving blood transfusion. He will start OTC oral iron.  -repeat CT CAP scan in 3-4 weeks  -F/u in 4 and 6 weeks   2. Iron Deficiency Anemia -Labs reviewed iron deficient anemia. I recommended IV iron.  We reviewed potential toxicities including a severe allergic reaction.  He agrees to proceed.  He will receive Feraheme on 11/14/17 and 11/20/17 and received blood transfusion on 11/22/17.  -Hg is 8.1, he is not symptomatic.  Does not want blood transfusion for now.  His ferritin is 862 today, I recommend he start OTC oral iron. He can take this with orange juice.  -If his Hg <8.0 or if he becomes symptomatic, I will recommend Blood transfusion. He is currently asymptomatic so we will monitor his levels for now.    3. Dysphagia, Weight loss secondary to #1 -30 pound weight loss over the past 4 months. He will increase intake of nutritional supplements.  We made a referral to the [facility] dietitian. -Need nutrition consult.  He is tolerating liquids well, we also discussed the role of palliative radiation and feeding tube placement if his dysphagia progresses. -Weight has improved with appetite. He takes 2-4 ensure a day.  -Dysphagia has resolved since he started chemo.  -He has declined seeing the dietician for now.    4. COPD -He uses oxygen as needed.    5. Skin abscess mid back, resolved -On exam he appears to have a skin abscess at the mid back.  We obtained a culture.  He will begin a 10-day course of Septra DS.  -abscess is gone now.   6. Goal of care discussion  -We again discussed the incurable nature of his/her cancer, and the overall poor prognosis, especially if he/she does not have good response to chemotherapy or progress on chemo -The patient  understands the goal of care is palliative. -I recommend DNR/DNI, she will think about it    PLAN:  -Labs reviewed and adequate to proceed with cycle 4 FOLFOX today  -Lab, flush, f/u and chemo FOLFOX every 2 weeks  -CT CAP with contrast in 3-4 weeks and see me in 4 weeks    Orders Placed This Encounter  Procedures  . Ferritin    Standing Status:   Standing    Number of Occurrences:   50    Standing Expiration Date:   12/31/2022     Truitt Merle, MD 01/01/2018 1:56 PM   This document serves as a record of services personally performed by Truitt Merle, MD. It was created on her behalf by Joslyn Devon, a trained medical scribe. The creation of this record is based on the scribe's personal observations and the provider's statements to them.    I have reviewed the above documentation for accuracy and completeness, and I agree with the above.

## 2018-01-01 NOTE — Patient Instructions (Signed)
Implanted Port Home Guide An implanted port is a type of central line that is placed under the skin. Central lines are used to provide IV access when treatment or nutrition needs to be given through a person's veins. Implanted ports are used for long-term IV access. An implanted port may be placed because:  You need IV medicine that would be irritating to the small veins in your hands or arms.  You need long-term IV medicines, such as antibiotics.  You need IV nutrition for a long period.  You need frequent blood draws for lab tests.  You need dialysis.  Implanted ports are usually placed in the chest area, but they can also be placed in the upper arm, the abdomen, or the leg. An implanted port has two main parts:  Reservoir. The reservoir is round and will appear as a small, raised area under your skin. The reservoir is the part where a needle is inserted to give medicines or draw blood.  Catheter. The catheter is a thin, flexible tube that extends from the reservoir. The catheter is placed into a large vein. Medicine that is inserted into the reservoir goes into the catheter and then into the vein.  How will I care for my incision site? Do not get the incision site wet. Bathe or shower as directed by your health care provider. How is my port accessed? Special steps must be taken to access the port:  Before the port is accessed, a numbing cream can be placed on the skin. This helps numb the skin over the port site.  Your health care provider uses a sterile technique to access the port. ? Your health care provider must put on a mask and sterile gloves. ? The skin over your port is cleaned carefully with an antiseptic and allowed to dry. ? The port is gently pinched between sterile gloves, and a needle is inserted into the port.  Only "non-coring" port needles should be used to access the port. Once the port is accessed, a blood return should be checked. This helps ensure that the port  is in the vein and is not clogged.  If your port needs to remain accessed for a constant infusion, a clear (transparent) bandage will be placed over the needle site. The bandage and needle will need to be changed every week, or as directed by your health care provider.  Keep the bandage covering the needle clean and dry. Do not get it wet. Follow your health care provider's instructions on how to take a shower or bath while the port is accessed.  If your port does not need to stay accessed, no bandage is needed over the port.  What is flushing? Flushing helps keep the port from getting clogged. Follow your health care provider's instructions on how and when to flush the port. Ports are usually flushed with saline solution or a medicine called heparin. The need for flushing will depend on how the port is used.  If the port is used for intermittent medicines or blood draws, the port will need to be flushed: ? After medicines have been given. ? After blood has been drawn. ? As part of routine maintenance.  If a constant infusion is running, the port may not need to be flushed.  How long will my port stay implanted? The port can stay in for as long as your health care provider thinks it is needed. When it is time for the port to come out, surgery will be   done to remove it. The procedure is similar to the one performed when the port was put in. When should I seek immediate medical care? When you have an implanted port, you should seek immediate medical care if:  You notice a bad smell coming from the incision site.  You have swelling, redness, or drainage at the incision site.  You have more swelling or pain at the port site or the surrounding area.  You have a fever that is not controlled with medicine.  This information is not intended to replace advice given to you by your health care provider. Make sure you discuss any questions you have with your health care provider. Document  Released: 11/25/2005 Document Revised: 05/02/2016 Document Reviewed: 08/02/2013 Elsevier Interactive Patient Education  2017 [employer]  

## 2018-01-01 NOTE — Patient Instructions (Signed)
[facility] Discharge Instructions for Patients Receiving Chemotherapy  Today you received the following chemotherapy agents: Oxaliplatin, Leucovorin and Fluorouracil.   To help prevent nausea and vomiting after your treatment, we encourage you to take your nausea medication as directed.  If you develop nausea and vomiting that is not controlled by your nausea medication, call the clinic.   BELOW ARE SYMPTOMS THAT SHOULD BE REPORTED IMMEDIATELY:  *FEVER GREATER THAN 100.5 F  *CHILLS WITH OR WITHOUT FEVER  NAUSEA AND VOMITING THAT IS NOT CONTROLLED WITH YOUR NAUSEA MEDICATION  *UNUSUAL SHORTNESS OF BREATH  *UNUSUAL BRUISING OR BLEEDING  TENDERNESS IN MOUTH AND THROAT WITH OR WITHOUT PRESENCE OF ULCERS  *URINARY PROBLEMS  *BOWEL PROBLEMS  UNUSUAL RASH Items with * indicate a potential emergency and should be followed up as soon as possible.  Feel free to call the clinic should you have any questions or concerns. The clinic phone number is [phone].  Please show the CHEMO ALERT CARD at check-in to the Emergency Department and triage nurse.   

## 2018-01-03 ENCOUNTER — Inpatient Hospital Stay: Payer: Medicare PPO

## 2018-01-03 VITALS — BP 125/70 | HR 107 | Temp 98.5°F | Resp 18

## 2018-01-03 DIAGNOSIS — Z5111 Encounter for antineoplastic chemotherapy: Secondary | ICD-10-CM | POA: Diagnosis not present

## 2018-01-03 DIAGNOSIS — C159 Malignant neoplasm of esophagus, unspecified: Secondary | ICD-10-CM

## 2018-01-03 MED ORDER — SODIUM CHLORIDE 0.9% FLUSH
10.0000 mL | INTRAVENOUS | Status: DC | PRN
  Administered 2018-01-03: 10 mL
  Filled 2018-01-03: qty 10

## 2018-01-03 MED ORDER — HEPARIN SOD (PORK) LOCK FLUSH 100 UNIT/ML IV SOLN
500.0000 [IU] | Freq: Once | INTRAVENOUS | Status: AC | PRN
  Administered 2018-01-03: 500 [IU]
  Filled 2018-01-03: qty 5

## 2018-01-15 ENCOUNTER — Encounter: Payer: Self-pay | Admitting: Nurse Practitioner

## 2018-01-15 ENCOUNTER — Inpatient Hospital Stay: Payer: Medicare PPO | Attending: Nurse Practitioner

## 2018-01-15 ENCOUNTER — Inpatient Hospital Stay (HOSPITAL_BASED_OUTPATIENT_CLINIC_OR_DEPARTMENT_OTHER): Payer: Medicare PPO | Admitting: Nurse Practitioner

## 2018-01-15 ENCOUNTER — Inpatient Hospital Stay: Payer: Medicare PPO

## 2018-01-15 VITALS — HR 108

## 2018-01-15 VITALS — BP 132/60 | HR 110 | Temp 98.4°F | Resp 18 | Ht 69.0 in | Wt 130.3 lb

## 2018-01-15 DIAGNOSIS — E279 Disorder of adrenal gland, unspecified: Secondary | ICD-10-CM | POA: Diagnosis not present

## 2018-01-15 DIAGNOSIS — Z5111 Encounter for antineoplastic chemotherapy: Secondary | ICD-10-CM | POA: Diagnosis present

## 2018-01-15 DIAGNOSIS — J449 Chronic obstructive pulmonary disease, unspecified: Secondary | ICD-10-CM

## 2018-01-15 DIAGNOSIS — D509 Iron deficiency anemia, unspecified: Secondary | ICD-10-CM | POA: Diagnosis not present

## 2018-01-15 DIAGNOSIS — C159 Malignant neoplasm of esophagus, unspecified: Secondary | ICD-10-CM

## 2018-01-15 DIAGNOSIS — Z452 Encounter for adjustment and management of vascular access device: Secondary | ICD-10-CM | POA: Insufficient documentation

## 2018-01-15 DIAGNOSIS — D500 Iron deficiency anemia secondary to blood loss (chronic): Secondary | ICD-10-CM

## 2018-01-15 DIAGNOSIS — C787 Secondary malignant neoplasm of liver and intrahepatic bile duct: Secondary | ICD-10-CM | POA: Diagnosis not present

## 2018-01-15 DIAGNOSIS — C160 Malignant neoplasm of cardia: Secondary | ICD-10-CM | POA: Diagnosis not present

## 2018-01-15 LAB — CMP (CANCER CENTER ONLY)
ALBUMIN: 2.4 g/dL — AB (ref 3.5–5.0)
ALK PHOS: 338 U/L — AB (ref 40–150)
ALT: 17 U/L (ref 0–55)
AST: 46 U/L — ABNORMAL HIGH (ref 5–34)
Anion gap: 7 (ref 3–11)
BUN: 30 mg/dL — AB (ref 7–26)
CALCIUM: 9.3 mg/dL (ref 8.4–10.4)
CO2: 28 mmol/L (ref 22–29)
CREATININE: 0.71 mg/dL (ref 0.70–1.30)
Chloride: 107 mmol/L (ref 98–109)
GFR, Est AFR Am: >60 mL/min (ref >60)
GFR, Estimated: >60 mL/min (ref >60)
GLUCOSE: 81 mg/dL (ref 70–140)
Potassium: 3.8 mmol/L (ref 3.5–5.1)
SODIUM: 142 mmol/L (ref 136–145)
Total Bilirubin: 0.2 mg/dL (ref 0.2–1.2)
Total Protein: 5.8 g/dL — ABNORMAL LOW (ref 6.4–8.3)

## 2018-01-15 LAB — IRON AND TIBC
Iron: 27 ug/dL — ABNORMAL LOW (ref 42–163)
Saturation Ratios: 12 % — ABNORMAL LOW (ref 42–163)
TIBC: 230 ug/dL (ref 202–409)
UIBC: 203 ug/dL

## 2018-01-15 LAB — CBC WITH DIFFERENTIAL (CANCER CENTER ONLY)
BASOS PCT: 0 %
Basophils Absolute: 0 10*3/uL (ref 0.0–0.1)
EOS ABS: 0.3 10*3/uL (ref 0.0–0.5)
Eosinophils Relative: 4 %
HEMATOCRIT: 27 % — AB (ref 38.4–49.9)
HEMOGLOBIN: 8 g/dL — AB (ref 13.0–17.1)
LYMPHS ABS: 0.8 10*3/uL — AB (ref 0.9–3.3)
Lymphocytes Relative: 11 %
MCH: 26.6 pg — ABNORMAL LOW (ref 27.2–33.4)
MCHC: 29.6 g/dL — ABNORMAL LOW (ref 32.0–36.0)
MCV: 89.7 fL (ref 79.3–98.0)
Monocytes Absolute: 1.3 10*3/uL — ABNORMAL HIGH (ref 0.1–0.9)
Monocytes Relative: 18 %
NEUTROS ABS: 4.9 10*3/uL (ref 1.5–6.5)
Neutrophils Relative %: 67 %
Platelet Count: 200 10*3/uL (ref 140–400)
RBC: 3.01 MIL/uL — AB (ref 4.20–5.82)
RDW: 21 % — ABNORMAL HIGH (ref 11.0–14.6)
WBC: 7.2 10*3/uL (ref 4.0–10.3)

## 2018-01-15 LAB — CEA (IN HOUSE-CHCC): CEA (CHCC-In House): 1.62 ng/mL (ref 0.00–5.00)

## 2018-01-15 MED ORDER — SODIUM CHLORIDE 0.9% FLUSH
3.0000 mL | Freq: Once | INTRAVENOUS | Status: AC | PRN
  Administered 2018-01-15: 10 mL via INTRAVENOUS
  Filled 2018-01-15: qty 10

## 2018-01-15 MED ORDER — PALONOSETRON HCL INJECTION 0.25 MG/5ML
INTRAVENOUS | Status: AC
  Filled 2018-01-15: qty 5

## 2018-01-15 MED ORDER — SODIUM CHLORIDE 0.9 % IV SOLN
2400.0000 mg/m2 | INTRAVENOUS | Status: DC
  Administered 2018-01-15: 4000 mg via INTRAVENOUS
  Filled 2018-01-15: qty 80

## 2018-01-15 MED ORDER — HEPARIN SOD (PORK) LOCK FLUSH 100 UNIT/ML IV SOLN
500.0000 [IU] | Freq: Once | INTRAVENOUS | Status: DC | PRN
  Filled 2018-01-15: qty 5

## 2018-01-15 MED ORDER — DEXAMETHASONE SODIUM PHOSPHATE 10 MG/ML IJ SOLN
10.0000 mg | Freq: Once | INTRAMUSCULAR | Status: AC
  Administered 2018-01-15: 10 mg via INTRAVENOUS

## 2018-01-15 MED ORDER — SODIUM CHLORIDE 0.9% FLUSH
10.0000 mL | INTRAVENOUS | Status: DC | PRN
  Filled 2018-01-15: qty 10

## 2018-01-15 MED ORDER — OXALIPLATIN CHEMO INJECTION 100 MG/20ML
90.0000 mg/m2 | Freq: Once | INTRAVENOUS | Status: AC
  Administered 2018-01-15: 150 mg via INTRAVENOUS
  Filled 2018-01-15: qty 20

## 2018-01-15 MED ORDER — DEXAMETHASONE SODIUM PHOSPHATE 10 MG/ML IJ SOLN
INTRAMUSCULAR | Status: AC
  Filled 2018-01-15: qty 1

## 2018-01-15 MED ORDER — DEXTROSE 5 % IV SOLN
Freq: Once | INTRAVENOUS | Status: AC
  Administered 2018-01-15: 12:00:00 via INTRAVENOUS

## 2018-01-15 MED ORDER — PALONOSETRON HCL INJECTION 0.25 MG/5ML
0.2500 mg | Freq: Once | INTRAVENOUS | Status: AC
  Administered 2018-01-15: 0.25 mg via INTRAVENOUS

## 2018-01-15 MED ORDER — LEUCOVORIN CALCIUM INJECTION 350 MG
400.0000 mg/m2 | Freq: Once | INTRAVENOUS | Status: AC
  Administered 2018-01-15: 664 mg via INTRAVENOUS
  Filled 2018-01-15: qty 33.2

## 2018-01-15 NOTE — Progress Notes (Signed)
  [facility] OFFICE PROGRESS NOTE   Diagnosis: Gastroesophageal carcinoma  INTERVAL HISTORY:   William Villegas returns as scheduled.  He completed cycle 4 FOLFOX 01/01/2018.  He denies nausea/vomiting.  No mouth sores.  No diarrhea.  Cold sensitivity lasts 2 or 3 days.  No persistent neuropathy symptoms.  No dysphagia.  No pain.  Appetite is better.  He is gaining weight.  Objective:  Vital signs in last 24 hours:  Blood pressure 132/60, pulse (!) 110, temperature 98.4 F (36.9 C), temperature source Oral, resp. rate 18, height 5\' 9"  (1.753 m), weight 130 lb 4.8 oz (59.1 kg), SpO2 97 %.    HEENT: White coating over tongue.  No buccal thrush. Resp: Lungs clear bilaterally. Cardio: Regular rate and rhythm. GI: Abdomen soft and nontender. Vascular: No leg edema. Neuro: Vibratory sense intact over the fingertips per tuning fork exam. Skin: Palms without erythema. Port-A-Cath without erythema.   Lab Results:  Lab Results  Component Value Date   WBC 7.2 01/15/2018   HGB 8.6 (L) 12/04/2017   HCT 27.0 (L) 01/15/2018   MCV 89.7 01/15/2018   PLT 200 01/15/2018   NEUTROABS 4.9 01/15/2018    Imaging:  No results found.  Medications: I have reviewed the patient's current medications.  Assessment/Plan: 1. Gastroesophagealjunction adenocarcinoma  Upper endoscopy 10/28/2017 with findings of a partially obstructing likely malignant submucosal esophageal tumor at the gastroesophageal junction contiguous with a gastric cardia mass.  Biopsy of both the esophagus and gastric cardia showed poorly differentiated carcinoma  CT scans chest/abdomen/pelvis 11/03/2017 with a 4 x 6 cm mass at the gastroesophageal junction; diffuse hepatic metastases; mild paraesophageal and gastrohepatic ligament adenopathy; by 1.8 cm right adrenal mass  Biopsy liver lesion 11/14/2017-metastatic poorly differentiated adenocarcinoma  Cycle 1 FOLFOX 11/20/2017  Cycle 2 FOLFOX  12/04/2017  Cycle 3 FOLFOX 12/18/2017  Cycle 4 FOLFOX 01/01/2018  Cycle 5 FOLFOX 01/15/2018 2. Dysphagia secondary to #1.  Improved. 3. Weight loss secondary to #1.  Improved. 4. COPD 5. Skin abscess mid back.He completed a course of Bactrim. 6. Port-A-Cath placement 11/14/2017 7. Iron deficiency anemia. He received Feraheme 11/14/2017, 11/20/2017.   Disposition: William Villegas appears stable.  He has completed 4 cycles of FOLFOX.  Plan to proceed with cycle 5 today as scheduled.  He is scheduled to undergo restaging CTs 01/20/2018.  He will return for a follow-up visit on 01/29/2018.  He will contact the office in the interim with any problems.    Ned Card ANP/GNP-BC   01/15/2018  10:49 AM

## 2018-01-15 NOTE — Patient Instructions (Signed)
[facility] Discharge Instructions for Patients Receiving Chemotherapy  Today you received the following chemotherapy agents: Oxaliplatin, Leucovorin and Fluorouracil.   To help prevent nausea and vomiting after your treatment, we encourage you to take your nausea medication as directed.  If you develop nausea and vomiting that is not controlled by your nausea medication, call the clinic.   BELOW ARE SYMPTOMS THAT SHOULD BE REPORTED IMMEDIATELY:  *FEVER GREATER THAN 100.5 F  *CHILLS WITH OR WITHOUT FEVER  NAUSEA AND VOMITING THAT IS NOT CONTROLLED WITH YOUR NAUSEA MEDICATION  *UNUSUAL SHORTNESS OF BREATH  *UNUSUAL BRUISING OR BLEEDING  TENDERNESS IN MOUTH AND THROAT WITH OR WITHOUT PRESENCE OF ULCERS  *URINARY PROBLEMS  *BOWEL PROBLEMS  UNUSUAL RASH Items with * indicate a potential emergency and should be followed up as soon as possible.  Feel free to call the clinic should you have any questions or concerns. The clinic phone number is [phone].  Please show the CHEMO ALERT CARD at check-in to the Emergency Department and triage nurse.   

## 2018-01-15 NOTE — Progress Notes (Signed)
OK to treat to day with Hgb 8 per NP Lattie Haw

## 2018-01-16 LAB — FOLATE RBC
Folate, Hemolysate: >620 ng/mL
Hematocrit: 25.8 % — ABNORMAL LOW (ref 37.5–51.0)

## 2018-01-17 ENCOUNTER — Inpatient Hospital Stay: Payer: Medicare PPO

## 2018-01-17 VITALS — BP 116/73 | HR 109 | Temp 98.2°F | Resp 16

## 2018-01-17 DIAGNOSIS — Z5111 Encounter for antineoplastic chemotherapy: Secondary | ICD-10-CM | POA: Diagnosis not present

## 2018-01-17 DIAGNOSIS — C159 Malignant neoplasm of esophagus, unspecified: Secondary | ICD-10-CM

## 2018-01-17 MED ORDER — HEPARIN SOD (PORK) LOCK FLUSH 100 UNIT/ML IV SOLN
500.0000 [IU] | Freq: Once | INTRAVENOUS | Status: AC | PRN
  Administered 2018-01-17: 500 [IU]
  Filled 2018-01-17: qty 5

## 2018-01-17 MED ORDER — SODIUM CHLORIDE 0.9% FLUSH
10.0000 mL | INTRAVENOUS | Status: DC | PRN
  Administered 2018-01-17: 10 mL
  Filled 2018-01-17: qty 10

## 2018-01-17 NOTE — Patient Instructions (Signed)
Implanted Port Home Guide An implanted port is a type of central line that is placed under the skin. Central lines are used to provide IV access when treatment or nutrition needs to be given through a person's veins. Implanted ports are used for long-term IV access. An implanted port may be placed because:  You need IV medicine that would be irritating to the small veins in your hands or arms.  You need long-term IV medicines, such as antibiotics.  You need IV nutrition for a long period.  You need frequent blood draws for lab tests.  You need dialysis.  Implanted ports are usually placed in the chest area, but they can also be placed in the upper arm, the abdomen, or the leg. An implanted port has two main parts:  Reservoir. The reservoir is round and will appear as a small, raised area under your skin. The reservoir is the part where a needle is inserted to give medicines or draw blood.  Catheter. The catheter is a thin, flexible tube that extends from the reservoir. The catheter is placed into a large vein. Medicine that is inserted into the reservoir goes into the catheter and then into the vein.  How will I care for my incision site? Do not get the incision site wet. Bathe or shower as directed by your health care provider. How is my port accessed? Special steps must be taken to access the port:  Before the port is accessed, a numbing cream can be placed on the skin. This helps numb the skin over the port site.  Your health care provider uses a sterile technique to access the port. ? Your health care provider must put on a mask and sterile gloves. ? The skin over your port is cleaned carefully with an antiseptic and allowed to dry. ? The port is gently pinched between sterile gloves, and a needle is inserted into the port.  Only "non-coring" port needles should be used to access the port. Once the port is accessed, a blood return should be checked. This helps ensure that the port  is in the vein and is not clogged.  If your port needs to remain accessed for a constant infusion, a clear (transparent) bandage will be placed over the needle site. The bandage and needle will need to be changed every week, or as directed by your health care provider.  Keep the bandage covering the needle clean and dry. Do not get it wet. Follow your health care provider's instructions on how to take a shower or bath while the port is accessed.  If your port does not need to stay accessed, no bandage is needed over the port.  What is flushing? Flushing helps keep the port from getting clogged. Follow your health care provider's instructions on how and when to flush the port. Ports are usually flushed with saline solution or a medicine called heparin. The need for flushing will depend on how the port is used.  If the port is used for intermittent medicines or blood draws, the port will need to be flushed: ? After medicines have been given. ? After blood has been drawn. ? As part of routine maintenance.  If a constant infusion is running, the port may not need to be flushed.  How long will my port stay implanted? The port can stay in for as long as your health care provider thinks it is needed. When it is time for the port to come out, surgery will be   done to remove it. The procedure is similar to the one performed when the port was put in. When should I seek immediate medical care? When you have an implanted port, you should seek immediate medical care if:  You notice a bad smell coming from the incision site.  You have swelling, redness, or drainage at the incision site.  You have more swelling or pain at the port site or the surrounding area.  You have a fever that is not controlled with medicine.  This information is not intended to replace advice given to you by your health care provider. Make sure you discuss any questions you have with your health care provider. Document  Released: 11/25/2005 Document Revised: 05/02/2016 Document Reviewed: 08/02/2013 Elsevier Interactive Patient Education  2017 [employer]  

## 2018-01-20 ENCOUNTER — Ambulatory Visit (HOSPITAL_COMMUNITY)
Admission: RE | Admit: 2018-01-20 | Discharge: 2018-01-20 | Disposition: A | Discharge status: Home / Self Care | Payer: Medicare PPO | Source: Ambulatory Visit | Attending: Hematology | Admitting: Hematology

## 2018-01-20 DIAGNOSIS — J439 Emphysema, unspecified: Secondary | ICD-10-CM | POA: Insufficient documentation

## 2018-01-20 DIAGNOSIS — C787 Secondary malignant neoplasm of liver and intrahepatic bile duct: Secondary | ICD-10-CM | POA: Insufficient documentation

## 2018-01-20 DIAGNOSIS — C159 Malignant neoplasm of esophagus, unspecified: Secondary | ICD-10-CM | POA: Insufficient documentation

## 2018-01-20 DIAGNOSIS — I700 Atherosclerosis of aorta: Secondary | ICD-10-CM | POA: Diagnosis not present

## 2018-01-20 DIAGNOSIS — R591 Generalized enlarged lymph nodes: Secondary | ICD-10-CM | POA: Diagnosis not present

## 2018-01-20 DIAGNOSIS — E279 Disorder of adrenal gland, unspecified: Secondary | ICD-10-CM | POA: Diagnosis not present

## 2018-01-20 MED ORDER — IOPAMIDOL (ISOVUE-300) INJECTION 61%
INTRAVENOUS | Status: AC
  Filled 2018-01-20: qty 100

## 2018-01-20 MED ORDER — IOPAMIDOL (ISOVUE-300) INJECTION 61%
30.0000 mL | Freq: Once | INTRAVENOUS | Status: AC | PRN
  Administered 2018-01-20: 30 mL via ORAL

## 2018-01-20 MED ORDER — IOPAMIDOL (ISOVUE-300) INJECTION 61%
INTRAVENOUS | Status: AC
  Filled 2018-01-20: qty 30

## 2018-01-20 MED ORDER — IOPAMIDOL (ISOVUE-300) INJECTION 61%
100.0000 mL | Freq: Once | INTRAVENOUS | Status: AC | PRN
  Administered 2018-01-20: 100 mL via INTRAVENOUS

## 2018-01-27 NOTE — Progress Notes (Signed)
William Villegas  [phone] Fax:([phone]) [phone]  Clinic Follow Up Note   Patient Care Team: Susy Frizzle, MD as PCP - General (Family Medicine)   Date of Service:  01/29/2018   CHIEF COMPLAINTS:  Gastroesophageal carcinoma    Esophageal cancer, stage IV ([place])   10/28/2017 Initial Diagnosis    Esophageal cancer, stage IV ([place])      10/28/2017 Procedure    Upper endoscopy on 10/28/2017 with findings of a partially obstructing likely malignant submucosal esophageal tumor at the gastroesophageal junction contiguous with a gastric cardia mass.        10/28/2017 Initial Biopsy    Biopsy of both the esophagus and gastric cardia showed poorly differentiated carcinoma.        11/03/2017 Imaging    CT scans 11/03/2017 showed a 4 x 6 cm mass at the gastroesophageal junction, diffuse hepatic metastases, mild paraesophageal and gastrohepatic ligament adenopathy and a new 1.8 cm right adrenal mass.        11/14/2017 Pathology Results    Liver Biopsy  Diagnosis 11/14/17  Liver, needle/core biopsy, right lobe METASTATIC POORLY DIFFERENTIATED ADENOCARCINOMA CONSISTENT WITH ESOPHAGEAL PRIMARY Microscopic Comment The carcinoma stains positive for cytokeratin AE1&3, ck8/18, ck7, cdx 2 (patchy), negative for ck5/6, TTF-1, ck20, synaptophysin, chromogranin, s100, melan A and cd 45. The morphology and immunostaining pattern support the above diagnosis.        11/20/2017 - 01/29/2018 Chemotherapy    First line chemo FOLFOX every 2 weeks starting 11/20/17 due to disease progression he will stop FOLFOX.       01/20/2018 Imaging    CT CAP W Contrast 01/20/18 IMPRESSION: 1. Similar appearance of the gastroesophageal junction mass measuring approximately 5.6 x 4.0 cm today. 2. Interval progression of bulky hepatic metastases. 3. Stable right adrenal nodule with new left adrenal nodule concerning for metastatic disease. 4. Distal paraesophageal and  gastrohepatic ligament lymphadenopathy measures minimally smaller today. 5.  Aortic Atherosclerois (ICD10-170.0) 6.  Emphysema. ([id number]-C94.9)       HISTORY OF PRESENTING ILLNESS: 11/05/17 William Villegas 72 y.o. male is here because of recent diagnosis of gastroesophageal carcinoma.  He initially presented with dysphagia and weight loss.  He was found to have iron deficiency anemia.  He underwent an upper endoscopy on 10/28/2017 with findings of a partially obstructing likely malignant submucosal esophageal tumor at the gastroesophageal junction contiguous with a gastric cardia mass.  Biopsy of both the esophagus and gastric cardia showed poorly differentiated carcinoma.  CT scans 11/03/2017 showed a 4 x 6 cm mass at the gastroesophageal junction, diffuse hepatic metastases, mild paraesophageal and gastrohepatic ligament adenopathy and a new 1.8 cm right adrenal mass.   CURRENT THERAPY : PENDING second line Keytruda every 3 weeks starting 02/08/18.    INTERVAL HISTORY  William Villegas is here for a follow up and 6th cycle FOLFOX and review his recent CT CAP from 01/20/18. He presents to the clinic today accompanied by his family member.    On review of symptoms, pt denies any new or concerning symptoms, bowel changes, weight or appetite loss, abdominal or new pain. He is overall doing well with treatment.     MEDICAL HISTORY:  Past Medical History:  Diagnosis Date  . COPD (chronic obstructive pulmonary disease) ([place])   . Hypertension     SURGICAL HISTORY: Past Surgical History:  Procedure Laterality Date  . ESOPHAGOGASTRODUODENOSCOPY (EGD) WITH PROPOFOL N/A 10/28/2017   Procedure: ESOPHAGOGASTRODUODENOSCOPY (EGD) WITH PROPOFOL;  Surgeon: Ladene Artist, MD;  Location: [facility];  Service: Endoscopy;  Laterality: N/A;  . IR FLUORO GUIDE PORT INSERTION RIGHT  11/14/2017  . IR US GUIDE VASC ACCESS RIGHT  11/14/2017  . SAVORY DILATION N/A 10/28/2017   Procedure: POSSIBLE SAVORY  DILATION;  Surgeon: Ladene Artist, MD;  Location: [facility];  Service: Endoscopy;  Laterality: N/A;    SOCIAL HISTORY: Social History   Socioeconomic History  . Marital status: Divorced    Spouse name: Not on file  . Number of children: Not on file  . Years of education: Not on file  . Highest education level: Not on file  Social Needs  . Financial resource strain: Not on file  . Food insecurity - worry: Not on file  . Food insecurity - inability: Not on file  . Transportation needs - medical: Not on file  . Transportation needs - non-medical: Not on file  Occupational History  . Not on file  Tobacco Use  . Smoking status: Former Smoker    Packs/day: 0.50    Years: 25.00    Pack years: 12.50    Types: Cigarettes    Last attempt to quit: 11/08/2016    Years since quitting: 1.2  . Smokeless tobacco: Never Used  Substance and Sexual Activity  . Alcohol use: No  . Drug use: No  . Sexual activity: Not on file  Other Topics Concern  . Not on file  Social History Narrative  . Not on file    FAMILY HISTORY: Sister deceased with "cancer", type unknown.  ALLERGIES:  has No Known Allergies.  MEDICATIONS:  Current Outpatient Medications  Medication Sig Dispense Refill  . aspirin 81 MG tablet Take 81 mg by mouth daily.    . Ferrous Sulfate (IRON) 325 (65 Fe) MG TABS Take 1 tablet by mouth daily.    Marland Kitchen lidocaine-prilocaine (EMLA) cream Apply to portacath site 1-2 hours prior to use. 30 g 2  . mirtazapine (REMERON) 7.5 MG tablet Take 1 tablet (7.5 mg total) by mouth at bedtime. 30 tablet 1  . OXYGEN Inhale 2 L as needed into the lungs (for shortness of breath).     . pantoprazole (PROTONIX) 40 MG tablet Take 40 mg 2 (two) times daily by mouth.  3  . prochlorperazine (COMPAZINE) 10 MG tablet Take 1 tablet (10 mg total) by mouth every 6 (six) hours as needed for nausea or vomiting. 30 tablet 0  . umeclidinium-vilanterol (ANORO ELLIPTA) 62.5-25 MCG/INH AEPB Inhale 1 puff into  the lungs daily. 30 each 0   No current facility-administered medications for this visit.     REVIEW OF SYSTEMS:   Constitutional: Denies fevers, chills or abnormal night sweats.  (+) Weight gain with normal appetite  Eyes: Denies blurriness of vision, double vision or watery eyes Ears, nose, mouth, throat, and face: Denies mucositis or sore throat Respiratory: Denies cough, dyspnea or wheezes Cardiovascular: Denies palpitation, chest discomfort or lower extremity swelling Gastrointestinal: (+) Denies nausea, heartburn or change in bowel habits Skin: Denies abnormal skin rashes; notes that he bruises easily. Lymphatics: Denies new lymphadenopathy or easy bruising Neurological:Denies numbness, tingling or new weaknesses Behavioral/Psych: Mood is stable, no new changes  All other systems were reviewed with the patient and are negative.  PHYSICAL EXAMINATION: ECOG PERFORMANCE STATUS: 2  Vitals:   01/29/18 0940  BP: 131/65  Pulse: (!) 118  Resp: 18  Temp: 98.4 F (36.9 C)  SpO2: 98%   Filed Weights   01/29/18 0940  Weight: 134 lb 11.2  oz (61.1 kg)    GENERAL:alert, no distress and comfortable SKIN: skin color, texture, turgor are normal, no rashes or significant lesions, (+) skin hyperpigmentation from the previous skin infection in the mid/low back  EYES: normal, conjunctiva are pink and non-injected, sclera clear OROPHARYNX:no exudate, no erythema and lips, buccal mucosa, and tongue normal  NECK: supple, thyroid normal size, non-tender, without nodularity LYMPH:  no palpable lymphadenopathy in the cervical, axillary or inguinal LUNGS: clear to auscultation and percussion  (+) breathing sounds low on left side.  HEART: regular rate & rhythm and no murmurs and no lower extremity edema ABDOMEN:abdomen soft, non-tender and normal bowel sounds Musculoskeletal:no cyanosis of digits and no clubbing  PSYCH: alert & oriented x 3 with fluent speech NEURO: no focal motor/sensory  deficits  LABORATORY DATA:  I have reviewed the data as listed CBC Latest Ref Rng & Units 01/29/2018 01/15/2018 01/15/2018  WBC 4.0 - 10.3 K/uL 8.4 7.2 -  Hemoglobin 13.0 - 17.1 g/dL - - -  Hematocrit 38.4 - 49.9 % 23.1(L) 27.0(L) 25.8(L)  Platelets 140 - 400 K/uL 226 200 -   CMP Latest Ref Rng & Units 01/29/2018 01/15/2018 01/01/2018  Glucose 70 - 140 mg/dL 87 81 87  BUN 7 - 26 mg/dL 22 30(H) 30(H)  Creatinine 0.70 - 1.30 mg/dL 0.67(L) 0.71 0.74  Sodium 136 - 145 mmol/L 140 142 139  Potassium 3.5 - 5.1 mmol/L 4.2 3.8 3.9  Chloride 98 - 109 mmol/L 107 107 104  CO2 22 - 29 mmol/L '24 28 27  '$ Calcium 8.4 - 10.4 mg/dL 9.3 9.3 9.3  Total Protein 6.4 - 8.3 g/dL 5.4(L) 5.8(L) 6.0(L)  Total Bilirubin 0.2 - 1.2 mg/dL 0.3 0.2 0.3  Alkaline Phos 40 - 150 U/L 420(H) 338(H) 281(H)  AST 5 - 34 U/L 58(H) 46(H) 44(H)  ALT 0 - 55 U/L '26 17 20    '$ PATHOLOGY REPORT   Liver Biopsy  Diagnosis 11/14/17  Liver, needle/core biopsy, right lobe METASTATIC POORLY DIFFERENTIATED ADENOCARCINOMA CONSISTENT WITH ESOPHAGEAL PRIMARY Microscopic Comment The carcinoma stains positive for cytokeratin AE1&3, ck8/18, ck7, cdx 2 (patchy), negative for ck5/6, TTF-1, ck20, synaptophysin, chromogranin, s100, melan A and cd 45. The morphology and immunostaining pattern support the above diagnosis.   Stomach Biopsy  Diagnosis 10/28/17 1. Stomach, biopsy, gastric cardia mass - POORLY DIFFERENTIATED CARCINOMA, SEE COMMENT. 2. Esophagus, biopsy, distal - POORLY DIFFERENTIATED CARCINOMA. Microscopic Comment 1. , 2. Both biopsies show similar features although tumor fragments are more abundant in specimen #1. The appearance favors adenocarcinoma but it is unclear if this is arising in stomach or esophagus based on this material. Dr Saralyn Pilar reviewed this case and concurs. The results were discussed with Dr Fuller Plan on 10/29/2017. (BNS:ecj 10/29/2017)  RADIOGRAPHIC STUDIES: I have personally reviewed the radiological images as  listed and agreed with the findings in the report. Ct Chest W Contrast  Result Date: 01/20/2018 CLINICAL DATA:  Stage IV esophageal cancer.  Restaging. EXAM: CT CHEST, ABDOMEN, AND PELVIS WITH CONTRAST TECHNIQUE: Multidetector CT imaging of the chest, abdomen and pelvis was performed following the standard protocol during bolus administration of intravenous contrast. CONTRAST:  179m ISOVUE-300 IOPAMIDOL (ISOVUE-300) INJECTION 61% COMPARISON:  11/03/2017 FINDINGS: CT CHEST FINDINGS Cardiovascular: The heart size is normal. No pericardial effusion. Coronary artery calcification is evident. Right Port-A-Cath tip is positioned in the mid to distal SVC. Mediastinum/Nodes: Distal paraesophageal lymph node measured previously at 1.7 cm short axis is 1.5 cm today. No other mediastinal lymphadenopathy. There is no hilar lymphadenopathy.  Similar appearance of soft tissue fullness distal esophagus at the esophagogastric junction tracking into the gastric cardia (see below). There is no axillary lymphadenopathy. Lungs/Pleura: Centrilobular and paraseptal emphysema noted. Subsegmental atelectasis noted right lower lobe. Atelectasis in the lingula is slightly progressed in the interval. No suspicious pulmonary nodule or mass. No focal airspace consolidation. Musculoskeletal: Bone windows reveal no worrisome lytic or sclerotic osseous lesions. CT ABDOMEN PELVIS FINDINGS Hepatobiliary: Bulky liver metastases again noted. Index lesion measured previously in the medial right liver at 6.2 x 5.3 cm now measures 8.9 x 7.6 cm. Lesion in the anterior right dome of liver seen on image 56 of series 2 today measures 9.4 x 7.4 cm. This compares to 6.2 x 5.4 cm when I remeasure the same lesion on the prior exam. Other lesions scattered in both lobes of the liver show similar levels of progression. There is no evidence for gallstones, gallbladder wall thickening, or pericholecystic fluid. No intrahepatic or extrahepatic biliary dilation.  Pancreas: No focal mass lesion. No dilatation of the main duct. No intraparenchymal cyst. No peripancreatic edema. Spleen: No splenomegaly. No focal mass lesion. Adrenals/Urinary Tract: 1.8 x 1.4 cm right adrenal nodule measured on the prior study is 1.3 x 1.9 cm today, unchanged. 11 mm left adrenal nodule appears new in the interval. Bilateral renal cysts are again noted. No evidence for hydroureter. The urinary bladder appears normal for the degree of distention. Stomach/Bowel: Ill-defined lesion in the gastroesophageal junction is again identified. This measures 5.6 x 4.0 cm today compared to 5.7 x 4.1 cm previously, not substantially changed. Duodenum is normally positioned as is the ligament of Treitz. No small bowel wall thickening. No small bowel dilatation. The terminal ileum is normal. The appendix is normal. Diverticular changes are noted in the left colon without evidence of diverticulitis. Wall thickening in the sigmoid colon likely related to the underlying diverticular disease. Vascular/Lymphatic: There is abdominal aortic atherosclerosis without aneurysm. 1.2 cm short axis gastrohepatic ligament lymph node measured on the previous study is 1.0 cm today. No hepatoduodenal ligament lymphadenopathy. No intraperitoneal or retroperitoneal lymphadenopathy. No pelvic sidewall lymphadenopathy. Reproductive: Prostate gland is enlarged. Other: Small volume free fluid identified in the pelvis, new in the interval. Musculoskeletal: Right groin hernia contains only fat. Fluid or scar from previous herniorrhaphy noted in the left groin region. Bone windows reveal no worrisome lytic or sclerotic osseous lesions. IMPRESSION: 1. Similar appearance of the gastroesophageal junction mass measuring approximately 5.6 x 4.0 cm today. 2. Interval progression of bulky hepatic metastases. 3. Stable right adrenal nodule with new left adrenal nodule concerning for metastatic disease. 4. Distal paraesophageal and gastrohepatic  ligament lymphadenopathy measures minimally smaller today. 5.  Aortic Atherosclerois (ICD10-170.0) 6.  Emphysema. ([id number]-J44.9) Electronically Signed   By: Misty Stanley M.D.   On: 01/20/2018 14:42   Ct Abdomen Pelvis W Contrast  Result Date: 01/20/2018 CLINICAL DATA:  Stage IV esophageal cancer.  Restaging. EXAM: CT CHEST, ABDOMEN, AND PELVIS WITH CONTRAST TECHNIQUE: Multidetector CT imaging of the chest, abdomen and pelvis was performed following the standard protocol during bolus administration of intravenous contrast. CONTRAST:  165m ISOVUE-300 IOPAMIDOL (ISOVUE-300) INJECTION 61% COMPARISON:  11/03/2017 FINDINGS: CT CHEST FINDINGS Cardiovascular: The heart size is normal. No pericardial effusion. Coronary artery calcification is evident. Right Port-A-Cath tip is positioned in the mid to distal SVC. Mediastinum/Nodes: Distal paraesophageal lymph node measured previously at 1.7 cm short axis is 1.5 cm today. No other mediastinal lymphadenopathy. There is no hilar lymphadenopathy. Similar appearance of soft tissue  fullness distal esophagus at the esophagogastric junction tracking into the gastric cardia (see below). There is no axillary lymphadenopathy. Lungs/Pleura: Centrilobular and paraseptal emphysema noted. Subsegmental atelectasis noted right lower lobe. Atelectasis in the lingula is slightly progressed in the interval. No suspicious pulmonary nodule or mass. No focal airspace consolidation. Musculoskeletal: Bone windows reveal no worrisome lytic or sclerotic osseous lesions. CT ABDOMEN PELVIS FINDINGS Hepatobiliary: Bulky liver metastases again noted. Index lesion measured previously in the medial right liver at 6.2 x 5.3 cm now measures 8.9 x 7.6 cm. Lesion in the anterior right dome of liver seen on image 56 of series 2 today measures 9.4 x 7.4 cm. This compares to 6.2 x 5.4 cm when I remeasure the same lesion on the prior exam. Other lesions scattered in both lobes of the liver show similar levels  of progression. There is no evidence for gallstones, gallbladder wall thickening, or pericholecystic fluid. No intrahepatic or extrahepatic biliary dilation. Pancreas: No focal mass lesion. No dilatation of the main duct. No intraparenchymal cyst. No peripancreatic edema. Spleen: No splenomegaly. No focal mass lesion. Adrenals/Urinary Tract: 1.8 x 1.4 cm right adrenal nodule measured on the prior study is 1.3 x 1.9 cm today, unchanged. 11 mm left adrenal nodule appears new in the interval. Bilateral renal cysts are again noted. No evidence for hydroureter. The urinary bladder appears normal for the degree of distention. Stomach/Bowel: Ill-defined lesion in the gastroesophageal junction is again identified. This measures 5.6 x 4.0 cm today compared to 5.7 x 4.1 cm previously, not substantially changed. Duodenum is normally positioned as is the ligament of Treitz. No small bowel wall thickening. No small bowel dilatation. The terminal ileum is normal. The appendix is normal. Diverticular changes are noted in the left colon without evidence of diverticulitis. Wall thickening in the sigmoid colon likely related to the underlying diverticular disease. Vascular/Lymphatic: There is abdominal aortic atherosclerosis without aneurysm. 1.2 cm short axis gastrohepatic ligament lymph node measured on the previous study is 1.0 cm today. No hepatoduodenal ligament lymphadenopathy. No intraperitoneal or retroperitoneal lymphadenopathy. No pelvic sidewall lymphadenopathy. Reproductive: Prostate gland is enlarged. Other: Small volume free fluid identified in the pelvis, new in the interval. Musculoskeletal: Right groin hernia contains only fat. Fluid or scar from previous herniorrhaphy noted in the left groin region. Bone windows reveal no worrisome lytic or sclerotic osseous lesions. IMPRESSION: 1. Similar appearance of the gastroesophageal junction mass measuring approximately 5.6 x 4.0 cm today. 2. Interval progression of bulky  hepatic metastases. 3. Stable right adrenal nodule with new left adrenal nodule concerning for metastatic disease. 4. Distal paraesophageal and gastrohepatic ligament lymphadenopathy measures minimally smaller today. 5.  Aortic Atherosclerois (ICD10-170.0) 6.  Emphysema. ([id number]) Electronically Signed   By: Misty Stanley M.D.   On: 01/20/2018 14:42    ASSESSMENT & PLAN:  Mr Kane is a 72 y.o. male with past medical history of COPD, not on oxygen, hypertension, presented with progressive dysphagia   1. 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  -Upper endoscopy 10/28/2017 with findings of a partially obstructing likely malignant submucosal esophageal tumor at the gastroesophageal junction contiguous with a gastric cardia mass. -Biopsy of both the esophagus and gastric cardia showed poorly differentiated carcinoma -CT scans chest/abdomen/pelvis 11/03/2017 with a 4 x 6 cm mass at the gastroesophageal junction; diffuse hepatic metastases; mild paraesophageal and gastrohepatic ligament adenopathy; by 1.8 cm right adrenal mass -IR liver biopsy from 11/14/17 showed poorly differentiated adenocarcinoma consistent  with metastatic disease.  -He started first line FOLFOX for every 2 weeks on 11/20/17. He has tolerated chemo without difficulty so far. His dysphasia and overall condition has much improved since he started chemo. He is clinically responding to treatment. -We discussed his CT CAP from 01/20/18 which shows growth in liver mets with stable primary tumor, overall disease progression unfortumnatley.  -I discussed FOLFOX regimen is not controlling his disease and I discussed his next treatment options.  -His PD-L1 IHC was positive (10%).  Based on the recent [employer] 181 trial data, pts with PD-L1 expression 10% or above do better with pembrolizumab than chemotherapy in second line (median OS improve  2.9month, with response rate 21.5%.  I recommend him to switch to [facility]200mg'$  every 3 weeks. I discussed the side effects in great detail.  But not limited to fatigue, autoimmune disease, such as pneumonitis, thyroiditis etc, . He agreed to start.  -We again discussed the goal of therapy is palliative. -Labs reviewed, Hg at 7.2 today. I recommend blood transfusion today, he is agreeable.  -Will start IV Keytruda next week and continue every 3 weeks.  -F/u in 4 weeks with cycle 2. If he develops any new or concerning side effects he can contact the clinic.    2. Iron Deficiency Anemia  -I recommended IV iron. We reviewed potential toxicities including a severe allergic reaction. He agrees to proceed.  He received Feraheme on 11/14/17 and 11/20/17 and received blood transfusion on 11/22/17.  -I recommended he start OTC oral iron. He can take this with orange juice.  -If his Hg <8.0 or if he becomes symptomatic, I will recommend Blood transfusion. He is currently asymptomatic so we will monitor his levels for now.  -Hg today (01/29/2018) is 7.2, I recommend blood transfusion today, he is agreeable.  -He will continue oral ferrous sulfate and increase to BID.     3. Dysphagia, Weight loss secondary to #1  -30 pound weight loss over 4 months. He will increase intake of nutritional supplements.  We previously made a referral to the [facility]. -He is tolerating liquids well, we also discussed the role of palliative radiation and feeding tube placement if his dysphagia progresses. -Weight has improved with appetite. He takes 2-4 ensure a day.  -Dysphagia has resolved since he started chemo.  -He has declined seeing the dietician for now.  -His weight is starting to increase with improved appetite.    4. COPD -He uses oxygen as needed.    5. Goal of care discussion  -We again discussed the incurable nature of his/her cancer, and the overall poor prognosis, especially if he/she  does not have good response to chemotherapy or progress on chemo -The patient understands the goal of care is palliative. -I recommended DNR/DNI, she will think about it    PLAN:  Blood transfusion today  Lab and Keytruda in 1 and 4 weeks  F/u in 4 weeks     Orders Placed This Encounter  Procedures  . Practitioner attestation of consent    I, the ordering practitioner, attest that I have discussed with the patient the benefits, risks, side effects, alternatives, likelihood of achieving goals and potential problems during recovery for the procedure listed.    Standing Status:   Future    Standing Expiration Date:   01/29/2019    Order Specific Question:   Procedure    Answer:   Blood Product(s)  . Complete patient signature process for consent form  Standing Status:   Future    Standing Expiration Date:   01/29/2019  . Care order/instruction    STAT  XXM for 2 units PRBCs for transfusion today at [facility] when available.    Standing Status:   Future    Number of Occurrences:   1    Standing Expiration Date:   01/29/2019  . Type and screen    Standing Status:   Future    Number of Occurrences:   1    Standing Expiration Date:   01/29/2019    Scheduling Instructions:     STAT  XXM for 2 units PRBCs for transfusion today at [facility] when available.  . Sample to Blood Bank    STAT  XXM for 2 units PRBCs for transfusion today at [facility] when available.    Standing Status:   Future    Number of Occurrences:   1    Standing Expiration Date:   01/29/2019     Truitt Merle, MD 01/29/2018    This document serves as a record of services personally performed by Truitt Merle, MD. It was created on her behalf by Joslyn Devon, a trained medical scribe. The creation of this record is based on the scribe's personal observations and the provider's statements to them.    I have reviewed the above documentation for accuracy and completeness, and I agree with the above.

## 2018-01-29 ENCOUNTER — Inpatient Hospital Stay: Payer: Medicare PPO

## 2018-01-29 ENCOUNTER — Inpatient Hospital Stay (HOSPITAL_BASED_OUTPATIENT_CLINIC_OR_DEPARTMENT_OTHER): Payer: Medicare PPO | Admitting: Hematology

## 2018-01-29 ENCOUNTER — Telehealth: Payer: Self-pay | Admitting: Hematology

## 2018-01-29 VITALS — BP 131/65 | HR 118 | Temp 98.4°F | Resp 18 | Ht 69.0 in | Wt 134.7 lb

## 2018-01-29 DIAGNOSIS — D500 Iron deficiency anemia secondary to blood loss (chronic): Secondary | ICD-10-CM

## 2018-01-29 DIAGNOSIS — Z5111 Encounter for antineoplastic chemotherapy: Secondary | ICD-10-CM | POA: Diagnosis present

## 2018-01-29 DIAGNOSIS — C159 Malignant neoplasm of esophagus, unspecified: Secondary | ICD-10-CM

## 2018-01-29 DIAGNOSIS — C787 Secondary malignant neoplasm of liver and intrahepatic bile duct: Secondary | ICD-10-CM | POA: Diagnosis present

## 2018-01-29 DIAGNOSIS — R1310 Dysphagia, unspecified: Secondary | ICD-10-CM

## 2018-01-29 DIAGNOSIS — C160 Malignant neoplasm of cardia: Secondary | ICD-10-CM | POA: Diagnosis present

## 2018-01-29 DIAGNOSIS — I10 Essential (primary) hypertension: Secondary | ICD-10-CM | POA: Diagnosis not present

## 2018-01-29 DIAGNOSIS — J449 Chronic obstructive pulmonary disease, unspecified: Secondary | ICD-10-CM

## 2018-01-29 DIAGNOSIS — D509 Iron deficiency anemia, unspecified: Secondary | ICD-10-CM

## 2018-01-29 DIAGNOSIS — R634 Abnormal weight loss: Secondary | ICD-10-CM

## 2018-01-29 DIAGNOSIS — Z452 Encounter for adjustment and management of vascular access device: Secondary | ICD-10-CM | POA: Diagnosis not present

## 2018-01-29 DIAGNOSIS — Z95828 Presence of other vascular implants and grafts: Secondary | ICD-10-CM | POA: Insufficient documentation

## 2018-01-29 DIAGNOSIS — Z7189 Other specified counseling: Secondary | ICD-10-CM

## 2018-01-29 LAB — CMP (CANCER CENTER ONLY)
ALBUMIN: 2.3 g/dL — AB (ref 3.5–5.0)
ALK PHOS: 420 U/L — AB (ref 40–150)
ALT: 26 U/L (ref 0–55)
ANION GAP: 9 (ref 3–11)
AST: 58 U/L — ABNORMAL HIGH (ref 5–34)
BUN: 22 mg/dL (ref 7–26)
CO2: 24 mmol/L (ref 22–29)
Calcium: 9.3 mg/dL (ref 8.4–10.4)
Chloride: 107 mmol/L (ref 98–109)
Creatinine: 0.67 mg/dL — ABNORMAL LOW (ref 0.70–1.30)
GFR, Est AFR Am: >60 mL/min (ref >60)
GFR, Estimated: >60 mL/min (ref >60)
Glucose, Bld: 87 mg/dL (ref 70–140)
POTASSIUM: 4.2 mmol/L (ref 3.5–5.1)
SODIUM: 140 mmol/L (ref 136–145)
TOTAL PROTEIN: 5.4 g/dL — AB (ref 6.4–8.3)
Total Bilirubin: 0.3 mg/dL (ref 0.2–1.2)

## 2018-01-29 LAB — ABO/RH: ABO/RH(D): A POS

## 2018-01-29 LAB — FERRITIN: FERRITIN: 337 ng/mL — AB (ref 22–316)

## 2018-01-29 LAB — SAMPLE TO BLOOD BANK

## 2018-01-29 LAB — CBC WITH DIFFERENTIAL (CANCER CENTER ONLY)
BASOS PCT: 1 %
Basophils Absolute: 0 10*3/uL (ref 0.0–0.1)
EOS ABS: 0.2 10*3/uL (ref 0.0–0.5)
Eosinophils Relative: 2 %
HCT: 23.1 % — ABNORMAL LOW (ref 38.4–49.9)
HEMOGLOBIN: 7.2 g/dL — AB (ref 13.0–17.1)
Lymphocytes Relative: 10 %
Lymphs Abs: 0.8 10*3/uL — ABNORMAL LOW (ref 0.9–3.3)
MCH: 28.1 pg (ref 27.2–33.4)
MCHC: 31.2 g/dL — ABNORMAL LOW (ref 32.0–36.0)
MCV: 89.9 fL (ref 79.3–98.0)
Monocytes Absolute: 1.3 10*3/uL — ABNORMAL HIGH (ref 0.1–0.9)
Monocytes Relative: 16 %
NEUTROS PCT: 71 %
Neutro Abs: 5.9 10*3/uL (ref 1.5–6.5)
PLATELETS: 226 10*3/uL (ref 140–400)
RBC: 2.57 MIL/uL — AB (ref 4.20–5.82)
RDW: 25.2 % — ABNORMAL HIGH (ref 11.0–14.6)
WBC: 8.4 10*3/uL (ref 4.0–10.3)

## 2018-01-29 LAB — PREPARE RBC (CROSSMATCH)

## 2018-01-29 MED ORDER — SODIUM CHLORIDE 0.9% FLUSH
10.0000 mL | Freq: Once | INTRAVENOUS | Status: AC
  Administered 2018-01-29: 10 mL
  Filled 2018-01-29: qty 10

## 2018-01-29 MED ORDER — SODIUM CHLORIDE 0.9 % IV SOLN
250.0000 mL | Freq: Once | INTRAVENOUS | Status: AC
  Administered 2018-01-29: 250 mL via INTRAVENOUS

## 2018-01-29 MED ORDER — ACETAMINOPHEN 325 MG PO TABS
ORAL_TABLET | ORAL | Status: AC
  Filled 2018-01-29: qty 2

## 2018-01-29 MED ORDER — DIPHENHYDRAMINE HCL 25 MG PO TABS
25.0000 mg | ORAL_TABLET | Freq: Once | ORAL | Status: AC
  Administered 2018-01-29: 25 mg via ORAL
  Filled 2018-01-29: qty 1

## 2018-01-29 MED ORDER — HEPARIN SOD (PORK) LOCK FLUSH 100 UNIT/ML IV SOLN
500.0000 [IU] | Freq: Once | INTRAVENOUS | Status: AC
  Administered 2018-01-29: 500 [IU]
  Filled 2018-01-29: qty 5

## 2018-01-29 MED ORDER — ACETAMINOPHEN 325 MG PO TABS
650.0000 mg | ORAL_TABLET | Freq: Once | ORAL | Status: AC
  Administered 2018-01-29: 650 mg via ORAL

## 2018-01-29 MED ORDER — DIPHENHYDRAMINE HCL 25 MG PO CAPS
ORAL_CAPSULE | ORAL | Status: AC
Start: 2018-01-29 — End: ?
  Filled 2018-01-29: qty 1

## 2018-01-29 NOTE — Progress Notes (Signed)
Call from Dr. Burr Medico, no treatment today, 1 unit RBC only.

## 2018-01-29 NOTE — Patient Instructions (Signed)

## 2018-01-29 NOTE — Telephone Encounter (Signed)
Future appointments cancelled and new scheduled appointments per 2/21 los/ AVS/Calendar printed

## 2018-01-30 LAB — BPAM RBC
Blood Product Expiration Date: 201903072359
ISSUE DATE / TIME: 201902211214
Unit Type and Rh: 6200

## 2018-01-30 LAB — TYPE AND SCREEN
ABO/RH(D): A POS
Antibody Screen: NEGATIVE
Unit division: 0

## 2018-01-31 ENCOUNTER — Encounter: Payer: Self-pay | Admitting: Hematology

## 2018-01-31 DIAGNOSIS — Z7189 Other specified counseling: Secondary | ICD-10-CM | POA: Insufficient documentation

## 2018-01-31 NOTE — Progress Notes (Signed)
DISCONTINUE ON PATHWAY REGIMEN - Gastroesophageal     A cycle is every 14 days:     Oxaliplatin      Leucovorin      5-Fluorouracil      5-Fluorouracil   **Always confirm dose/schedule in your pharmacy ordering system**    REASON: Disease Progression PRIOR TREATMENT: GEOS3: mFOLFOX6 q14 Days Until Progression or Unacceptable Toxicity TREATMENT RESPONSE: Progressive Disease (PD)  START OFF PATHWAY REGIMEN - Gastroesophageal   [id number]:Pembrolizumab 2 mg/kg q21 Days:   A cycle is every 21 days:     Pembrolizumab   **Always confirm dose/schedule in your pharmacy ordering system**    Patient Characteristics: Distant Metastases (cM1/pM1) / Locally Recurrent Disease, Adenocarcinoma - Esophageal, GE Junction, and Gastric, Second Line, MSS / pMMR or MSI Unknown Histology: Adenocarcinoma Disease Classification: GE Junction Therapeutic Status: Distant Metastases (No Additional Staging) Would you be surprised if this patient died  in the next year<= I would NOT be surprised if this patient died in the next year Line of Therapy: Second Line Microsatellite/Mismatch Repair Status: MSS/pMMR Intent of Therapy: Non-Curative / Palliative Intent, Discussed with Patient 

## 2018-02-02 ENCOUNTER — Inpatient Hospital Stay (HOSPITAL_COMMUNITY)
Admission: EM | Admit: 2018-02-02 | Discharge: 2018-02-06 | DRG: 375 | Disposition: E | Payer: Medicare PPO | Attending: Family Medicine | Admitting: Family Medicine

## 2018-02-02 ENCOUNTER — Encounter (HOSPITAL_COMMUNITY): Payer: Self-pay | Admitting: Emergency Medicine

## 2018-02-02 ENCOUNTER — Encounter (HOSPITAL_COMMUNITY): Admission: EM | Disposition: E | Payer: Self-pay | Source: Home / Self Care | Attending: Family Medicine

## 2018-02-02 ENCOUNTER — Inpatient Hospital Stay (HOSPITAL_COMMUNITY): Payer: Medicare PPO | Admitting: Anesthesiology

## 2018-02-02 ENCOUNTER — Other Ambulatory Visit: Payer: Self-pay

## 2018-02-02 ENCOUNTER — Emergency Department (HOSPITAL_COMMUNITY): Payer: Medicare PPO

## 2018-02-02 DIAGNOSIS — K921 Melena: Secondary | ICD-10-CM | POA: Diagnosis present

## 2018-02-02 DIAGNOSIS — R64 Cachexia: Secondary | ICD-10-CM | POA: Diagnosis present

## 2018-02-02 DIAGNOSIS — C159 Malignant neoplasm of esophagus, unspecified: Secondary | ICD-10-CM

## 2018-02-02 DIAGNOSIS — Z7401 Bed confinement status: Secondary | ICD-10-CM

## 2018-02-02 DIAGNOSIS — K117 Disturbances of salivary secretion: Secondary | ICD-10-CM | POA: Diagnosis not present

## 2018-02-02 DIAGNOSIS — Z66 Do not resuscitate: Secondary | ICD-10-CM | POA: Diagnosis not present

## 2018-02-02 DIAGNOSIS — Z515 Encounter for palliative care: Secondary | ICD-10-CM

## 2018-02-02 DIAGNOSIS — I9589 Other hypotension: Secondary | ICD-10-CM

## 2018-02-02 DIAGNOSIS — Z9221 Personal history of antineoplastic chemotherapy: Secondary | ICD-10-CM | POA: Diagnosis not present

## 2018-02-02 DIAGNOSIS — K922 Gastrointestinal hemorrhage, unspecified: Secondary | ICD-10-CM | POA: Diagnosis present

## 2018-02-02 DIAGNOSIS — R7989 Other specified abnormal findings of blood chemistry: Secondary | ICD-10-CM | POA: Diagnosis present

## 2018-02-02 DIAGNOSIS — Z7982 Long term (current) use of aspirin: Secondary | ICD-10-CM | POA: Diagnosis not present

## 2018-02-02 DIAGNOSIS — M25572 Pain in left ankle and joints of left foot: Secondary | ICD-10-CM

## 2018-02-02 DIAGNOSIS — N179 Acute kidney failure, unspecified: Secondary | ICD-10-CM | POA: Diagnosis present

## 2018-02-02 DIAGNOSIS — C787 Secondary malignant neoplasm of liver and intrahepatic bile duct: Secondary | ICD-10-CM | POA: Diagnosis present

## 2018-02-02 DIAGNOSIS — F329 Major depressive disorder, single episode, unspecified: Secondary | ICD-10-CM | POA: Diagnosis present

## 2018-02-02 DIAGNOSIS — D500 Iron deficiency anemia secondary to blood loss (chronic): Secondary | ICD-10-CM | POA: Diagnosis present

## 2018-02-02 DIAGNOSIS — I959 Hypotension, unspecified: Secondary | ICD-10-CM | POA: Diagnosis present

## 2018-02-02 DIAGNOSIS — Z87891 Personal history of nicotine dependence: Secondary | ICD-10-CM | POA: Diagnosis not present

## 2018-02-02 DIAGNOSIS — S8262XA Displaced fracture of lateral malleolus of left fibula, initial encounter for closed fracture: Secondary | ICD-10-CM | POA: Diagnosis present

## 2018-02-02 DIAGNOSIS — J449 Chronic obstructive pulmonary disease, unspecified: Secondary | ICD-10-CM | POA: Diagnosis present

## 2018-02-02 DIAGNOSIS — C158 Malignant neoplasm of overlapping sites of esophagus: Secondary | ICD-10-CM | POA: Diagnosis present

## 2018-02-02 DIAGNOSIS — E861 Hypovolemia: Secondary | ICD-10-CM | POA: Diagnosis not present

## 2018-02-02 DIAGNOSIS — Z681 Body mass index (BMI) 19 or less, adult: Secondary | ICD-10-CM

## 2018-02-02 DIAGNOSIS — R945 Abnormal results of liver function studies: Secondary | ICD-10-CM | POA: Diagnosis present

## 2018-02-02 DIAGNOSIS — C160 Malignant neoplasm of cardia: Secondary | ICD-10-CM | POA: Diagnosis present

## 2018-02-02 DIAGNOSIS — R0603 Acute respiratory distress: Secondary | ICD-10-CM | POA: Diagnosis not present

## 2018-02-02 DIAGNOSIS — K920 Hematemesis: Secondary | ICD-10-CM | POA: Diagnosis present

## 2018-02-02 DIAGNOSIS — F32A Depression, unspecified: Secondary | ICD-10-CM | POA: Diagnosis present

## 2018-02-02 DIAGNOSIS — W1830XA Fall on same level, unspecified, initial encounter: Secondary | ICD-10-CM | POA: Diagnosis present

## 2018-02-02 DIAGNOSIS — I10 Essential (primary) hypertension: Secondary | ICD-10-CM | POA: Diagnosis present

## 2018-02-02 DIAGNOSIS — Z79899 Other long term (current) drug therapy: Secondary | ICD-10-CM

## 2018-02-02 DIAGNOSIS — D62 Acute posthemorrhagic anemia: Secondary | ICD-10-CM | POA: Diagnosis present

## 2018-02-02 DIAGNOSIS — D649 Anemia, unspecified: Secondary | ICD-10-CM

## 2018-02-02 DIAGNOSIS — Z9981 Dependence on supplemental oxygen: Secondary | ICD-10-CM

## 2018-02-02 DIAGNOSIS — Z8249 Family history of ischemic heart disease and other diseases of the circulatory system: Secondary | ICD-10-CM

## 2018-02-02 DIAGNOSIS — X58XXXA Exposure to other specified factors, initial encounter: Secondary | ICD-10-CM | POA: Diagnosis not present

## 2018-02-02 DIAGNOSIS — S93402A Sprain of unspecified ligament of left ankle, initial encounter: Secondary | ICD-10-CM | POA: Diagnosis not present

## 2018-02-02 HISTORY — PX: ESOPHAGOGASTRODUODENOSCOPY: SHX5428

## 2018-02-02 HISTORY — DX: Personal history of other medical treatment: Z92.89

## 2018-02-02 HISTORY — DX: Malignant (primary) neoplasm, unspecified: C80.1

## 2018-02-02 HISTORY — DX: Anemia, unspecified: D64.9

## 2018-02-02 LAB — CBC WITH DIFFERENTIAL/PLATELET
BASOS ABS: 0 10*3/uL (ref 0.0–0.1)
Basophils Relative: 0 %
Eosinophils Absolute: 0 10*3/uL (ref 0.0–0.7)
Eosinophils Relative: 0 %
HCT: 17.3 % — ABNORMAL LOW (ref 39.0–52.0)
HEMOGLOBIN: 5.1 g/dL — AB (ref 13.0–17.0)
LYMPHS ABS: 1.4 10*3/uL (ref 0.7–4.0)
Lymphocytes Relative: 17 %
MCH: 28.3 pg (ref 26.0–34.0)
MCHC: 29.5 g/dL — AB (ref 30.0–36.0)
MCV: 96.1 fL (ref 78.0–100.0)
MONO ABS: 1.5 10*3/uL — AB (ref 0.1–1.0)
Monocytes Relative: 18 %
NEUTROS ABS: 5.2 10*3/uL (ref 1.7–7.7)
Neutrophils Relative %: 65 %
Platelets: 336 10*3/uL (ref 150–400)
RBC: 1.8 MIL/uL — ABNORMAL LOW (ref 4.22–5.81)
RDW: 23.3 % — AB (ref 11.5–15.5)
WBC: 8.1 10*3/uL (ref 4.0–10.5)

## 2018-02-02 LAB — PREPARE FRESH FROZEN PLASMA
UNIT DIVISION: 0
Unit division: 0

## 2018-02-02 LAB — URINALYSIS, ROUTINE W REFLEX MICROSCOPIC
BILIRUBIN URINE: NEGATIVE
GLUCOSE, UA: NEGATIVE mg/dL
Ketones, ur: NEGATIVE mg/dL
LEUKOCYTES UA: NEGATIVE
NITRITE: NEGATIVE
PROTEIN: NEGATIVE mg/dL
SPECIFIC GRAVITY, URINE: 1.016 (ref 1.005–1.030)
pH: 5 (ref 5.0–8.0)

## 2018-02-02 LAB — COMPREHENSIVE METABOLIC PANEL
ALK PHOS: 336 U/L — AB (ref 38–126)
ALT: 72 U/L — ABNORMAL HIGH (ref 17–63)
ANION GAP: 17 — AB (ref 5–15)
AST: 206 U/L — ABNORMAL HIGH (ref 15–41)
Albumin: 1.9 g/dL — ABNORMAL LOW (ref 3.5–5.0)
BUN: 68 mg/dL — ABNORMAL HIGH (ref 6–20)
CALCIUM: 8.1 mg/dL — AB (ref 8.9–10.3)
CO2: 14 mmol/L — AB (ref 22–32)
Chloride: 110 mmol/L (ref 101–111)
Creatinine, Ser: 1.8 mg/dL — ABNORMAL HIGH (ref 0.61–1.24)
GFR calc non Af Amer: 36 mL/min — ABNORMAL LOW (ref >60)
GFR, EST AFRICAN AMERICAN: 41 mL/min — AB (ref >60)
Glucose, Bld: 91 mg/dL (ref 65–99)
Potassium: 5 mmol/L (ref 3.5–5.1)
SODIUM: 141 mmol/L (ref 135–145)
TOTAL PROTEIN: 4 g/dL — AB (ref 6.5–8.1)
Total Bilirubin: 0.4 mg/dL (ref 0.3–1.2)

## 2018-02-02 LAB — CBC
HCT: 22.5 % — ABNORMAL LOW (ref 39.0–52.0)
HEMATOCRIT: 31.2 % — AB (ref 39.0–52.0)
HEMOGLOBIN: 10.2 g/dL — AB (ref 13.0–17.0)
Hemoglobin: 7.1 g/dL — ABNORMAL LOW (ref 13.0–17.0)
MCH: 28.2 pg (ref 26.0–34.0)
MCH: 29 pg (ref 26.0–34.0)
MCHC: 31.6 g/dL (ref 30.0–36.0)
MCHC: 32.7 g/dL (ref 30.0–36.0)
MCV: 89.3 fL (ref 78.0–100.0)
MCV: 88.6 fL (ref 78.0–100.0)
PLATELETS: 249 10*3/uL (ref 150–400)
Platelets: 216 10*3/uL (ref 150–400)
RBC: 2.52 MIL/uL — AB (ref 4.22–5.81)
RBC: 3.52 MIL/uL — ABNORMAL LOW (ref 4.22–5.81)
RDW: 20.3 % — AB (ref 11.5–15.5)
RDW: 19.5 % — ABNORMAL HIGH (ref 11.5–15.5)
WBC: 7.5 10*3/uL (ref 4.0–10.5)
WBC: 10.3 10*3/uL (ref 4.0–10.5)

## 2018-02-02 LAB — BPAM FFP
BLOOD PRODUCT EXPIRATION DATE: 201903012359
Blood Product Expiration Date: 201903082359
ISSUE DATE / TIME: 201902250125
ISSUE DATE / TIME: 201902250125
UNIT TYPE AND RH: 6200
UNIT TYPE AND RH: 6200

## 2018-02-02 LAB — OCCULT BLOOD GASTRIC / DUODENUM (SPECIMEN CUP): OCCULT BLOOD, GASTRIC: POSITIVE — AB

## 2018-02-02 LAB — LIPASE, BLOOD: Lipase: 29 U/L (ref 11–51)

## 2018-02-02 LAB — BASIC METABOLIC PANEL
ANION GAP: 10 (ref 5–15)
BUN: 72 mg/dL — ABNORMAL HIGH (ref 6–20)
CHLORIDE: 113 mmol/L — AB (ref 101–111)
CO2: 16 mmol/L — AB (ref 22–32)
CREATININE: 1.62 mg/dL — AB (ref 0.61–1.24)
Calcium: 8.1 mg/dL — ABNORMAL LOW (ref 8.9–10.3)
GFR calc non Af Amer: 40 mL/min — ABNORMAL LOW (ref >60)
GFR, EST AFRICAN AMERICAN: 47 mL/min — AB (ref >60)
Glucose, Bld: 87 mg/dL (ref 65–99)
Potassium: 5.5 mmol/L — ABNORMAL HIGH (ref 3.5–5.1)
SODIUM: 139 mmol/L (ref 135–145)

## 2018-02-02 LAB — ABO/RH: ABO/RH(D): A POS

## 2018-02-02 LAB — PROTIME-INR
INR: 1.52
PROTHROMBIN TIME: 18.2 s — AB (ref 11.4–15.2)

## 2018-02-02 LAB — APTT: aPTT: 36 seconds (ref 24–36)

## 2018-02-02 LAB — PREPARE RBC (CROSSMATCH)

## 2018-02-02 SURGERY — EGD (ESOPHAGOGASTRODUODENOSCOPY)
Anesthesia: Monitor Anesthesia Care

## 2018-02-02 MED ORDER — SODIUM CHLORIDE 0.9 % IV SOLN
10.0000 mL/h | Freq: Once | INTRAVENOUS | Status: AC
  Administered 2018-02-02: 10 mL/h via INTRAVENOUS

## 2018-02-02 MED ORDER — PROPOFOL 10 MG/ML IV BOLUS
INTRAVENOUS | Status: DC | PRN
  Administered 2018-02-02: 20 mg via INTRAVENOUS
  Administered 2018-02-02: 10 mg via INTRAVENOUS

## 2018-02-02 MED ORDER — PROPOFOL 500 MG/50ML IV EMUL
INTRAVENOUS | Status: DC | PRN
  Administered 2018-02-02: 150 ug/kg/min via INTRAVENOUS

## 2018-02-02 MED ORDER — MORPHINE SULFATE (PF) 4 MG/ML IV SOLN: 1.0000 mg | INTRAVENOUS | Status: DC | PRN

## 2018-02-02 MED ORDER — SODIUM CHLORIDE 0.9 % IV BOLUS (SEPSIS)
1000.0000 mL | Freq: Once | INTRAVENOUS | Status: AC
  Administered 2018-02-02: 1000 mL via INTRAVENOUS

## 2018-02-02 MED ORDER — ALBUTEROL SULFATE (2.5 MG/3ML) 0.083% IN NEBU: 2.5000 mg | INHALATION_SOLUTION | RESPIRATORY_TRACT | Status: DC | PRN

## 2018-02-02 MED ORDER — ZOLPIDEM TARTRATE 5 MG PO TABS: 5.0000 mg | ORAL_TABLET | Freq: Every evening | ORAL | Status: DC | PRN

## 2018-02-02 MED ORDER — PHENYLEPHRINE 40 MCG/ML (10ML) SYRINGE FOR IV PUSH (FOR BLOOD PRESSURE SUPPORT)
PREFILLED_SYRINGE | INTRAVENOUS | Status: DC | PRN
  Administered 2018-02-02 (×4): 80 ug via INTRAVENOUS

## 2018-02-02 MED ORDER — FERROUS SULFATE 325 (65 FE) MG PO TABS
325.0000 mg | ORAL_TABLET | Freq: Every day | ORAL | Status: DC
  Administered 2018-02-02 – 2018-02-04 (×3): 325 mg via ORAL
  Filled 2018-02-02 (×3): qty 1

## 2018-02-02 MED ORDER — UMECLIDINIUM-VILANTEROL 62.5-25 MCG/INH IN AEPB
1.0000 | INHALATION_SPRAY | Freq: Every day | RESPIRATORY_TRACT | Status: DC
  Administered 2018-02-05: 1 via RESPIRATORY_TRACT
  Filled 2018-02-02 (×2): qty 14

## 2018-02-02 MED ORDER — LIDOCAINE-PRILOCAINE 2.5-2.5 % EX CREA: TOPICAL_CREAM | Freq: Two times a day (BID) | CUTANEOUS | Status: DC | PRN

## 2018-02-02 MED ORDER — MIRTAZAPINE 7.5 MG PO TABS: 7.5000 mg | ORAL_TABLET | Freq: Every day | ORAL | Status: DC

## 2018-02-02 MED ORDER — SODIUM CHLORIDE 0.9 % IV SOLN
80.0000 mg | Freq: Once | INTRAVENOUS | Status: AC
  Administered 2018-02-02: 02:00:00 80 mg via INTRAVENOUS
  Filled 2018-02-02: qty 80

## 2018-02-02 MED ORDER — DEXMEDETOMIDINE HCL 200 MCG/2ML IV SOLN
INTRAVENOUS | Status: DC | PRN
  Administered 2018-02-02: 8 ug via INTRAVENOUS
  Administered 2018-02-02: 4 ug via INTRAVENOUS

## 2018-02-02 MED ORDER — SODIUM CHLORIDE 0.9 % IV SOLN
INTRAVENOUS | Status: DC
  Administered 2018-02-02 – 2018-02-03 (×3): via INTRAVENOUS

## 2018-02-02 MED ORDER — SODIUM CHLORIDE 0.9 % IV SOLN
8.0000 mg/h | INTRAVENOUS | Status: DC
  Administered 2018-02-02 (×3): 8 mg/h via INTRAVENOUS
  Filled 2018-02-02 (×6): qty 80

## 2018-02-02 MED ORDER — ONDANSETRON HCL 4 MG/2ML IJ SOLN
4.0000 mg | Freq: Once | INTRAMUSCULAR | Status: AC
  Administered 2018-02-02: 4 mg via INTRAVENOUS
  Filled 2018-02-02: qty 2

## 2018-02-02 NOTE — Progress Notes (Signed)
PROGRESS NOTE    William Villegas  SJG:[id number] DOB: 21-Oct-1946 DOA: 01/22/2018 PCP: Susy Frizzle, MD   Brief Narrative:  HPI on 02/01/2018 by Dr. Ivor Costa William Villegas is a 72 y.o. male with medical history significant of hypertension, COPD, GERD, depression, stage IV metastasized esophageal cancer, who presents with hematemesis and hematochezia.  Patient states that he has been having dark stool diarrhea in the past 3 days. Last night he had 1 large amount of hematemesis with dark blood. He had mild lower abdominal pain, which has resolved. He has mild dizziness, and shortness of breath, no chest pain. He has mild cough, no fever or chills. He states that sometimes he has mild dysuria. No unilateral weakness. Patient was initially hypotensive with blood pressure 62/38, which improved to 92/50 after 2 L normal saline bolus and 1 unit to urgent blood transfusion.  Interim history Patient admitted for GI bleeding, gastroenterology consulted and appreciated. Assessment & Plan   GI bleed/Acute blood loss anemia/Acute on chronic anemia -Patient presented with hematemesis and melena -Patient was seen and had an EGD November 2018 which showed an esophageal mass -Patient does have history of stage IV esophageal cancer -Continue Protonix GTT -Presented with hemoglobin 5.1, patient transfused 2 units PRBC -baseline hemoglobin 8 -Continue to monitor CBC  Essential hypertension /hypotension -currently not on any medications at home and has hypotension likely secondary to blood loss -Continue to monitor closely  Acute kidney injury -Creatinine 1.80 on admission, currently 1.62 -Suspect secondary to blood loss  -continue to monitor BMP  Abnormal LFTs -Likely due to liver metastasis -continue to monitor  Depression -Remeron currently held  COPD -Appears stable, no wheezing -Continue oxygen -Continue albuterol nebs  DVT Prophylaxis  SCDs  Code Status: Full  Family  Communication: None at bedside  Disposition Plan: Admitted, pending GI consultation and recommendations  Consultants Gastroenterology  Procedures  None  Antibiotics   Anti-infectives (From admission, onward)   None      Subjective:   William Villegas seen and examined today.  Patient states he is only vomited once since EMS had picked him up.  Has had very dark bowel movements.  Currently denies chest pain or shortness of breath, abdominal pain, dizziness or headache.  Objective:   Vitals:   01/22/2018 1145 01/31/2018 1200 01/16/2018 1205 01/11/2018 1249  BP: (!) 114/51 (!) 112/56    Pulse: 88 90    Resp: 19 19    Temp:   98.2 F (36.8 C) 98.4 F (36.9 C)  TempSrc:   Oral Oral  SpO2: 100% 97%    Weight:      Height:        Intake/Output Summary (Last 24 hours) at 02/04/2018 1323 Last data filed at 01/11/2018 1013 Gross per 24 hour  Intake 4603 ml  Output -  Net 4603 ml   Filed Weights   01/22/2018 0130  Weight: 60.8 kg (134 lb)    Exam  General: Well developed, chronically ill-appearing, thin, no apparent distress  HEENT: NCAT, PERRLA, EOMI, Anicteic Sclera, mucous membranes moist.   Neck: Supple, no JVD, no masses  Cardiovascular: S1 S2 auscultated, no rubs, murmurs or gallops. Regular rate and rhythm.  Respiratory: Clear to auscultation bilaterally with equal chest rise  Abdomen: Soft, nontender, nondistended, + bowel sounds  Extremities: warm dry without cyanosis clubbing.  Trace LE edema  Neuro: AAOx3, nonfocal  Skin: Without rashes exudates or nodules  Psych: Normal affect and demeanor with intact judgement and insight  Data Reviewed: I have personally reviewed following labs and imaging studies  CBC: Recent Labs  Lab 01/29/18 0909 02/01/2018 0142 02/01/2018 1014  WBC 8.4 8.1 7.5  NEUTROABS 5.9 5.2  --   HGB  --  5.1* 7.1*  HCT 23.1* 17.3* 22.5*  MCV 89.9 96.1 89.3  PLT 226 336 878   Basic Metabolic Panel: Recent Labs  Lab 01/29/18 0909  02/04/2018 0142 01/20/2018 1014  NA 140 141 139  K 4.2 5.0 5.5*  CL 107 110 113*  CO2 24 14* 16*  GLUCOSE 87 91 87  BUN 22 68* 72*  CREATININE 0.67* 1.80* 1.62*  CALCIUM 9.3 8.1* 8.1*   GFR: Estimated Creatinine Clearance: 34.9 mL/min (A) (by C-G formula based on SCr of 1.62 mg/dL (H)). Liver Function Tests: Recent Labs  Lab 01/29/18 0909 01/13/2018 0142  AST 58* 206*  ALT 26 72*  ALKPHOS 420* 336*  BILITOT 0.3 0.4  PROT 5.4* 4.0*  ALBUMIN 2.3* 1.9*   Recent Labs  Lab 02/04/2018 0142  LIPASE 29   No results for input(s): AMMONIA in the last 168 hours. Coagulation Profile: Recent Labs  Lab 01/21/2018 0142  INR 1.52   Cardiac Enzymes: No results for input(s): CKTOTAL, CKMB, CKMBINDEX, TROPONINI in the last 168 hours. BNP (last 3 results) No results for input(s): PROBNP in the last 8760 hours. HbA1C: No results for input(s): HGBA1C in the last 72 hours. CBG: No results for input(s): GLUCAP in the last 168 hours. Lipid Profile: No results for input(s): CHOL, HDL, LDLCALC, TRIG, CHOLHDL, LDLDIRECT in the last 72 hours. Thyroid Function Tests: No results for input(s): TSH, T4TOTAL, FREET4, T3FREE, THYROIDAB in the last 72 hours. Anemia Panel: No results for input(s): VITAMINB12, FOLATE, FERRITIN, TIBC, IRON, RETICCTPCT in the last 72 hours. Urine analysis:    Component Value Date/Time   COLORURINE YELLOW 12/18/2017 [place] 12/18/2017 1315   LABSPEC 1.013 12/18/2017 1315   PHURINE 6.0 12/18/2017 1315   GLUCOSEU NEGATIVE 12/18/2017 1315   HGBUR NEGATIVE 12/18/2017 [place] 12/18/2017 1315   [place] 12/18/2017 1315   PROTEINUR NEGATIVE 12/18/2017 1315   NITRITE NEGATIVE 12/18/2017 1315   LEUKOCYTESUR NEGATIVE 12/18/2017 1315   Sepsis Labs: @LABRCNTIP (procalcitonin:4,lacticidven:4)  )No results found for this or any previous visit (from the past 240 hour(s)).    Radiology Studies: Dg Abd 1 View  Result Date:  01/18/2018 CLINICAL DATA:  Difficulty breathing, lower abdominal pain. EXAM: ABDOMEN - 1 VIEW COMPARISON:  None. FINDINGS: Nonobstructive bowel gas pattern. Hepatomegaly likely present, likely related to the bulky hepatic metastases seen on prior CT. No free air. IMPRESSION: No obstruction or free air. Probable hepatomegaly. This is likely related to the bulky metastatic disease seen on prior CT. Electronically Signed   By: Rolm Baptise M.D.   On: 01/12/2018 01:59   Dg Chest Portable 1 View  Result Date: 01/23/2018 CLINICAL DATA:  Shortness of Breath EXAM: PORTABLE CHEST 1 VIEW COMPARISON:  Chest CT 01/20/2018 FINDINGS: Right Port-A-Cath tip is in the SVC. Heart is normal size. No confluent airspace opacities or effusions. No acute bony abnormality. IMPRESSION: No active cardiopulmonary disease. Electronically Signed   By: Rolm Baptise M.D.   On: 01/14/2018 01:58     Scheduled Meds: . [MAR Hold] ferrous sulfate  325 mg Oral Daily  . [MAR Hold] umeclidinium-vilanterol  1 puff Inhalation Daily   Continuous Infusions: . sodium chloride 125 mL/hr at 01/12/2018 0157  . pantoprozole (PROTONIX) infusion 8 mg/hr (01/23/2018  1247)     LOS: 0 days   Time Spent in minutes   30 minutes  Johnisha Louks D.O. on 02/01/2018 at 1:23 PM  Between 7am to 7pm - Pager - [phone]  After 7pm go to [email or web address] - password [email or web address]  And look for the night coverage person covering for me after hours  [facility] Office  [phone]

## 2018-02-02 NOTE — Anesthesia Procedure Notes (Signed)
Procedure Name: MAC Performed by: Valda Favia, CRNA Oxygen Delivery Method: Nasal cannula Airway Equipment and Method: Bite block Placement Confirmation: positive ETCO2 Dental Injury: Teeth and Oropharynx as per pre-operative assessment

## 2018-02-02 NOTE — Consult Note (Signed)
Referring Provider: [facility]   Primary Care Physician:  Susy Frizzle, MD Primary Gastroenterologist:   Lucio Edward, MD  Reason for Consultation:  GI bleed  ASSESSMENT AND PLAN:    74.  72 yo male with stage IV esophageal cancer. Disease progression after 6 cycles of FOLFOX. Followed by Dr. Burr Medico. Supposed to start [place] in a few days.   2. GI bleed, presumably upper, with black stools and dark emesis (x1) on iron. Could be bleeding from esophageal mass.  -Needs EGD. The risks and benefits of EGD were discussed and the patient agrees to proceed.  -continue PPI gtt for now  3. AKI, improving with IV fluids  4. Acute blood loss anemia / chronic anemia. Hgb mid 8 range over last few months, now down to ~ 5.  -getting 3rd unit of blood now. Follow up hgb not yet done.   5. COPD, on home 02 prn  HPI: SAHMIR WEATHERBEE is a 72 y.o. 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 He was treated with 6 cycles of FOLFOX but chemo stopped due to disease progression. He is supposed to start [place] on 2018-02-28.   Over the last few days patient has been having frequent loose black stools at home. He started iron several days ago. He has developed concurrent non-radiating RUQ pain and nausea. The pain has been intermittent. Not eating much because of the nausea.Brought to to ED during the night with SOB.  No actual vomiting until he was in the ambulance and vomiting dark material. Hypotensive on arrival with BP in 60's. He has AKI. Patient takes a daily baby asa at home, no other NSAIDS. Hgb has been mid 8 range over last couple of months, currently at 5.1. Breathing better now. Getting 3rd unit of blood now.   Past Medical History:  Diagnosis Date  . Anemia   . Cancer ([place])   . COPD (chronic obstructive pulmonary disease) ([place])   . History of blood transfusion   . Hypertension     Past Surgical History:  Procedure  Laterality Date  . ESOPHAGOGASTRODUODENOSCOPY (EGD) WITH PROPOFOL N/A 10/28/2017   Procedure: ESOPHAGOGASTRODUODENOSCOPY (EGD) WITH PROPOFOL;  Surgeon: Ladene Artist, MD;  Location: [facility];  Service: Endoscopy;  Laterality: N/A;  . IR FLUORO GUIDE PORT INSERTION RIGHT  11/14/2017  . IR US GUIDE VASC ACCESS RIGHT  11/14/2017  . SAVORY DILATION N/A 10/28/2017   Procedure: POSSIBLE SAVORY DILATION;  Surgeon: Ladene Artist, MD;  Location: [facility]OSCOPY;  Service: Endoscopy;  Laterality: N/A;    Prior to Admission medications   Medication Sig Start Date End Date Taking? Authorizing Provider  aspirin 81 MG tablet Take 81 mg by mouth daily.    [provider]  Ferrous Sulfate (IRON) 325 (65 Fe) MG TABS Take 1 tablet by mouth daily.    [provider]  lidocaine-prilocaine (EMLA) cream Apply to portacath site 1-2 hours prior to use. 11/20/17   Owens Shark, NP  mirtazapine (REMERON) 7.5 MG tablet Take 1 tablet (7.5 mg total) by mouth at bedtime. 11/20/17   Truitt Merle, MD  OXYGEN Inhale 2 L as needed into the lungs (for shortness of breath).     [provider]  pantoprazole (PROTONIX) 40 MG tablet Take 40 mg 2 (two) times daily by mouth. 10/10/17   [provider]  prochlorperazine (COMPAZINE) 10 MG tablet Take 1 tablet (10 mg total) by mouth  every 6 (six) hours as needed for nausea or vomiting. 11/05/17   Owens Shark, NP  umeclidinium-vilanterol (ANORO ELLIPTA) 62.5-25 MCG/INH AEPB Inhale 1 puff into the lungs daily. 11/29/16   Susy Frizzle, MD    Current Facility-Administered Medications  Medication Dose Route Frequency Provider Last Rate Last Dose  . 0.9 %  sodium chloride infusion   Intravenous Continuous Ward, Kristen N, DO 125 mL/hr at 02/01/2018 0157    . 0.9 %  sodium chloride infusion  10 mL/hr Intravenous Once Ward, Kristen N, DO      . albuterol (PROVENTIL) (2.5 MG/3ML) 0.083% nebulizer solution 2.5 mg  2.5 mg Nebulization Q4H PRN Ivor Costa, MD      . ferrous sulfate tablet 325 mg  325 mg Oral Daily Ivor Costa, MD      . lidocaine-prilocaine (EMLA) cream   Topical BID PRN Ivor Costa, MD      . morphine 4 MG/ML injection 1 mg  1 mg Intravenous Q4H PRN Ivor Costa, MD      . pantoprazole (PROTONIX) 80 mg in sodium chloride 0.9 % 250 mL (0.32 mg/mL) infusion  8 mg/hr Intravenous Continuous Ward, Kristen N, DO 25 mL/hr at 02/01/2018 0227 8 mg/hr at 01/21/2018 0227  . umeclidinium-vilanterol (ANORO ELLIPTA) 62.5-25 MCG/INH 1 puff  1 puff Inhalation Daily Ivor Costa, MD      . zolpidem (AMBIEN) tablet 5 mg  5 mg Oral QHS PRN Ivor Costa, MD       Current Outpatient Medications  Medication Sig Dispense Refill  . aspirin 81 MG tablet Take 81 mg by mouth daily.    . Ferrous Sulfate (IRON) 325 (65 Fe) MG TABS Take 1 tablet by mouth daily.    Marland Kitchen lidocaine-prilocaine (EMLA) cream Apply to portacath site 1-2 hours prior to use. 30 g 2  . mirtazapine (REMERON) 7.5 MG tablet Take 1 tablet (7.5 mg total) by mouth at bedtime. 30 tablet 1  . OXYGEN Inhale 2 L as needed into the lungs (for shortness of breath).     . pantoprazole (PROTONIX) 40 MG tablet Take 40 mg 2 (two) times daily by mouth.  3  . prochlorperazine (COMPAZINE) 10 MG tablet Take 1 tablet (10 mg total) by mouth every 6 (six) hours as needed for nausea or vomiting. 30 tablet 0  . umeclidinium-vilanterol (ANORO ELLIPTA) 62.5-25 MCG/INH AEPB Inhale 1 puff into the lungs daily. 30 each 0    Allergies as of 02/01/2018  . (No Known Allergies)    Family History  Problem Relation Age of Onset  . Heart disease Mother   . Heart disease Father     Social History   Socioeconomic History  . Marital status: Divorced    Spouse name: Not on file  . Number of children: Not on file  . Years of education: Not on file  . Highest education level: Not on file  Social Needs  . Financial resource strain: Not on file  . Food insecurity - worry: Not on file  . Food insecurity - inability:  Not on file  . Transportation needs - medical: Not on file  . Transportation needs - non-medical: Not on file  Occupational History  . Not on file  Tobacco Use  . Smoking status: Former Smoker    Packs/day: 0.50    Years: 25.00    Pack years: 12.50    Types: Cigarettes    Last attempt to quit: 11/08/2016    Years since quitting: 1.2  .  Smokeless tobacco: Never Used  Substance and Sexual Activity  . Alcohol use: No  . Drug use: No  . Sexual activity: Not on file  Other Topics Concern  . Not on file  Social History Narrative  . Not on file    Review of Systems: All systems reviewed and negative except where noted in HPI.  Physical Exam: Vital signs in last 24 hours: Temp:  [96.6 F (35.9 C)-97.9 F (36.6 C)] 97.9 F (36.6 C) (02/25 0644) Pulse Rate:  [79-106] 87 (02/25 0945) Resp:  [14-22] 20 (02/25 0945) BP: (62-116)/(38-63) 94/49 (02/25 0945) SpO2:  [73 %-100 %] 98 % (02/25 0945) Weight:  [134 lb (60.8 kg)] 134 lb (60.8 kg) (02/25 0130)   General:   Alert, thin white male in NAD Psych:  Pleasant, cooperative. Normal mood and affect. Eyes:  Pupils equal, sclera clear, no icterus.   Conjunctiva pink. Ears:  Normal auditory acuity. Nose:  No deformity, discharge,  or lesions. Neck:  Supple; no masses Lungs:  Clear throughout to auscultation.   No wheezes, crackles, or rhonchi.  Heart:  Regular rate and rhythm; no murmurs, trace bilateral pedal edema Abdomen:  Soft, non-distended, nontender, BS active, no palp mass    Rectal:  Deferred  Msk:  Symmetrical without gross deformities.  Skin: lower extremities both dry. Pulses:  Normal pulses noted. Neurologic:  Alert and  oriented x4;  grossly normal neurologically. Skin:  Intact without significant lesions or rashes..   Intake/Output from previous day: 02/24 0701 - 02/25 0700 In: 4288 [I.V.:2000; Blood:1288; IV Piggyback:1000] Out: -  Intake/Output this shift: No intake/output data recorded.  Lab  Results: Recent Labs    01/23/2018 0142  WBC 8.1  HGB 5.1*  HCT 17.3*  PLT 336   BMET Recent Labs    02/04/2018 0142  NA 141  K 5.0  CL 110  CO2 14*  GLUCOSE 91  BUN 68*  CREATININE 1.80*  CALCIUM 8.1*   LFT Recent Labs    01/31/2018 0142  PROT 4.0*  ALBUMIN 1.9*  AST 206*  ALT 72*  ALKPHOS 336*  BILITOT 0.4   PT/INR Recent Labs    02/03/2018 0142  LABPROT 18.2*  INR 1.52   Hepatitis Panel No results for input(s): HEPBSAG, HCVAB, HEPAIGM, HEPBIGM in the last 72 hours.    Studies/Results: Dg Abd 1 View  Result Date: 02/03/2018 CLINICAL DATA:  Difficulty breathing, lower abdominal pain. EXAM: ABDOMEN - 1 VIEW COMPARISON:  None. FINDINGS: Nonobstructive bowel gas pattern. Hepatomegaly likely present, likely related to the bulky hepatic metastases seen on prior CT. No free air. IMPRESSION: No obstruction or free air. Probable hepatomegaly. This is likely related to the bulky metastatic disease seen on prior CT. Electronically Signed   By: Rolm Baptise M.D.   On: 01/20/2018 01:59   Dg Chest Portable 1 View  Result Date: 01/20/2018 CLINICAL DATA:  Shortness of Breath EXAM: PORTABLE CHEST 1 VIEW COMPARISON:  Chest CT 01/20/2018 FINDINGS: Right Port-A-Cath tip is in the SVC. Heart is normal size. No confluent airspace opacities or effusions. No acute bony abnormality. IMPRESSION: No active cardiopulmonary disease. Electronically Signed   By: Rolm Baptise M.D.   On: 01/27/2018 01:58     Tye Savoy, NP-C @  01/12/2018, 10:02 AM  Pager number [phone]

## 2018-02-02 NOTE — ED Notes (Signed)
Son - Larkin Ina going home please call if there is an issue or to update.  [phone].  Please leave message and try home [phone]

## 2018-02-02 NOTE — ED Notes (Signed)
Grandson at bedside stating pt had 5-6 black liquid stools today.

## 2018-02-02 NOTE — ED Notes (Signed)
Pt signed consent for blood electronically. Copy printed, and placed at bedside, along with in computer chart. 1 unit blood ready in lab, will go get.

## 2018-02-02 NOTE — Op Note (Signed)
[facility] Patient Name: William Villegas Procedure Date : 01/30/2018 MRN: [id number] Attending MD: Jerene Bears , MD Date of Birth: 08-07-1946 CSN: [id number] Age: 72 Admit Type: Inpatient Procedure:                Upper GI endoscopy Indications:              Acute post hemorrhagic anemia, Coffee-ground                            emesis, Melena Providers:                Lajuan Lines. Hilarie Fredrickson, MD, Burtis Junes, RN, Cherylynn Ridges,                            Technician, Edmonia James, CRNA Referring MD:             [facility] Medicines:                Monitored Anesthesia Care Complications:            No immediate complications. Estimated Blood Loss:     Estimated blood loss: none. Procedure:                Pre-Anesthesia Assessment:                           - Prior to the procedure, a History and Physical                            was performed, and patient medications and                            allergies were reviewed. The patient's tolerance of                            previous anesthesia was also reviewed. The risks                            and benefits of the procedure and the sedation                            options and risks were discussed with the patient.                            All questions were answered, and informed consent                            was obtained. Prior Anticoagulants: The patient has                            taken no previous anticoagulant or antiplatelet                            agents. ASA Grade Assessment: III - A patient with  severe systemic disease. After reviewing the risks                            and benefits, the patient was deemed in                            satisfactory condition to undergo the procedure.                           After obtaining informed consent, the endoscope was                            passed under direct vision. Throughout the   procedure, the patient's blood pressure, pulse, and                            oxygen saturations were monitored continuously. The                            EG-[id number] ([id number]) scope was introduced through the                            mouth, and advanced to the second part of duodenum.                            The upper GI endoscopy was accomplished without                            difficulty. The patient tolerated the procedure                            well. Scope In: Scope Out: Findings:      A large, fungating and ulcerating mass with no active bleeding was found       at the gastroesophageal junction and in the cardia, beginning 38 cm from       the incisors. The mass was partially obstructing and partially       circumferential (involving one-half of the lumen circumference).      Hematin (altered blood/coffee-ground-like material) was found in the       gastric body which obscured complete visualization of the gastric mucosa.      No gross lesions were noted in the entire examined stomach.      The examined duodenum was normal. Impression:               - Partially obstructing, malignant esophageal tumor                            was found at the gastroesophageal junction and in                            the cardia. Likely source of recent bleeding                            (coffee ground emesis, old blood in the stomach,  and melena).                           - Hematin (altered blood/coffee-ground-like                            material) in the gastric body.                           - No other gross lesions in the stomach.                           - Normal examined duodenum.                           - No specimens collected. Moderate Sedation:      N/A Recommendation:           - Return patient to hospital ward for ongoing care.                           - Full liquid diet.                           - Monitor Hgb and response to red  cell transfusion.                           - Very limited endoscopic options for tumor                            bleeding in the distal esophagus/gastric cardia.                            Support anemia with transfusion as needed. If brisk                            rebleeding, hemospray would be the only potential,                            temporizing, endoscopic option. IR for embolization                            may also be considered if brisk upper GI bleeding.                           - Continue present medications, including BID PPI.                           - GI will be available, call with questions. Procedure Code(s):        --- Professional ---                           [phone], Esophagogastroduodenoscopy, flexible,                            transoral; diagnostic, including collection of  specimen(s) by brushing or washing, when performed                            (separate procedure) Diagnosis Code(s):        --- Professional ---                           C16.0, Malignant neoplasm of cardia                           K92.2, Gastrointestinal hemorrhage, unspecified                           D62, Acute posthemorrhagic anemia                           K92.0, Hematemesis                           K92.1, Melena (includes Hematochezia) CPT copyright 2016 [employer]. All rights reserved. The codes documented in this report are preliminary and upon coder review may  be revised to meet current compliance requirements. Jerene Bears, MD 01/22/2018 2:42:27 PM This report has been signed electronically. Number of Addenda: 0

## 2018-02-02 NOTE — Anesthesia Postprocedure Evaluation (Signed)
Anesthesia Post Note  Patient: TYDE LAMISON  Procedure(s) Performed: ESOPHAGOGASTRODUODENOSCOPY (EGD) (N/A )     Patient location during evaluation: PACU Anesthesia Type: MAC Level of consciousness: awake and alert Pain management: pain level controlled Vital Signs Assessment: post-procedure vital signs reviewed and stable Respiratory status: spontaneous breathing, nonlabored ventilation, respiratory function stable and patient connected to nasal cannula oxygen Cardiovascular status: stable and blood pressure returned to baseline Postop Assessment: no apparent nausea or vomiting Anesthetic complications: no    Last Vitals:  Vitals:   01/23/2018 1455 02/04/2018 1500  BP:  (!) 100/48  Pulse: 88 88  Resp: (!) 23 (!) 22  Temp:    SpO2: 100% 95%    Last Pain:  Vitals:   01/26/2018 1428  TempSrc: Oral  PainSc:                  Brielle Moro,W. EDMOND

## 2018-02-02 NOTE — H&P (Signed)
History and Physical    NYZIR DUBOIS ZJQ:[id number] DOB: 1946/02/04 DOA: 01/31/2018  Referring MD/NP/PA:   PCP: Susy Frizzle, MD   Patient coming from:  The patient is coming from home.  At baseline, pt is independent for most of ADL. SNF  Assistant living facility   [facility]irement center.       Chief Complaint: Hematemesis, hematochezia  HPI: William Villegas is a 72 y.o. male with medical history significant of hypertension, COPD, GERD, depression, stage IV metastasized esophageal cancer, who presents with hematemesis and hematochezia.  Patient states that he has been having dark stool diarrhea in the past 3 days. Last night he had 1 large amount of hematemesis with dark blood. He had mild lower abdominal pain, which has resolved. He has mild dizziness, and shortness of breath, no chest pain. He has mild cough, no fever or chills. He states that sometimes he has mild dysuria. No unilateral weakness. Patient was initially hypotensive with blood pressure 62/38, which improved to 92/50 after 2 L normal saline bolus and 1 unit to urgent blood transfusion.  ED Course: pt was found to have  hemoglobin dropped from 7.2 on 01/29/18 to 5.1, WBC 8.1, INR 1.52, PTT 36, acute renal injury with creatinine 1.80, BUN 68, abnormal liver functions with AST 206, ALP 72, ALP 336, total bilirubin 0.4, chest x-ray negative. Abdominal x-ray is negative for free air, but showed Metastasize disease. Patient is admitted to stepdown as inpatient.   Review of Systems:   General: no fevers, chills, no body weight gain, has poor appetite, has fatigue HEENT: no blurry vision, hearing changes or sore throat Respiratory: has dyspnea, coughing,no  wheezing CV: no chest pain, no palpitations GI: no nausea,  has hematemesis, hematochezia, abdominal pain and diarrhea, no constipation GU: no dysuria, burning on urination, increased urinary frequency, hematuria  Ext: no leg edema Neuro: no unilateral weakness,  numbness, or tingling, no vision change or hearing loss Skin: no rash, no skin tear. MSK: No muscle spasm, no deformity, no limitation of range of movement in spin Heme: No easy bruising.  Travel history: No recent long distant travel.  Allergy: No Known Allergies  Past Medical History:  Diagnosis Date  . Anemia   . Cancer ([place])   . COPD (chronic obstructive pulmonary disease) ([place])   . History of blood transfusion   . Hypertension     Past Surgical History:  Procedure Laterality Date  . ESOPHAGOGASTRODUODENOSCOPY (EGD) WITH PROPOFOL N/A 10/28/2017   Procedure: ESOPHAGOGASTRODUODENOSCOPY (EGD) WITH PROPOFOL;  Surgeon: Ladene Artist, MD;  Location: [facility];  Service: Endoscopy;  Laterality: N/A;  . IR FLUORO GUIDE PORT INSERTION RIGHT  11/14/2017  . IR US GUIDE VASC ACCESS RIGHT  11/14/2017  . SAVORY DILATION N/A 10/28/2017   Procedure: POSSIBLE SAVORY DILATION;  Surgeon: Ladene Artist, MD;  Location: [facility];  Service: Endoscopy;  Laterality: N/A;    Social History:  reports that he quit smoking about 14 months ago. His smoking use included cigarettes. He has a 12.50 pack-year smoking history. he has never used smokeless tobacco. He reports that he does not drink alcohol or use drugs.  Family History:  Family History  Problem Relation Age of Onset  . Heart disease Mother   . Heart disease Father      Prior to Admission medications   Medication Sig Start Date End Date Taking? Authorizing Provider  aspirin 81 MG tablet Take 81 mg by mouth daily.    [provider]  Ferrous Sulfate (IRON) 325 (65 Fe) MG TABS Take 1 tablet by mouth daily.    [provider]  lidocaine-prilocaine (EMLA) cream Apply to portacath site 1-2 hours prior to use. 11/20/17   Owens Shark, NP  mirtazapine (REMERON) 7.5 MG tablet Take 1 tablet (7.5 mg total) by mouth at bedtime. 11/20/17   Truitt Merle, MD  OXYGEN Inhale 2 L as needed into the lungs (for shortness of breath).      [provider]  pantoprazole (PROTONIX) 40 MG tablet Take 40 mg 2 (two) times daily by mouth. 10/10/17   [provider]  prochlorperazine (COMPAZINE) 10 MG tablet Take 1 tablet (10 mg total) by mouth every 6 (six) hours as needed for nausea or vomiting. 11/05/17   Owens Shark, NP  umeclidinium-vilanterol (ANORO ELLIPTA) 62.5-25 MCG/INH AEPB Inhale 1 puff into the lungs daily. 11/29/16   Susy Frizzle, MD    Physical Exam: Vitals:   02/04/2018 0355 01/16/2018 0415 01/24/2018 0445 01/16/2018 0500  BP:  (!) 116/50 (!) 102/54 (!) 101/56  Pulse:  92 94 92  Resp:  17 18 17   Temp: 97.8 F (36.6 C)     TempSrc: Temporal     SpO2:  91% 94% 95%  Weight:      Height:       General: Not in acute distress. Pale looking, cachectic HEENT:       Eyes: PERRL, EOMI, no scleral icterus.       ENT: No discharge from the ears and nose, no pharynx injection, no tonsillar enlargement.        Neck: No JVD, no bruit, no mass felt. Heme: No neck lymph node enlargement. Cardiac: S1/S2, RRR, No murmurs, No gallops or rubs. Respiratory: No rales, wheezing, rhonchi or rubs. GI: Soft, nondistended, nontender, no rebound pain, no organomegaly, BS present. GU: No hematuria Ext: No pitting leg edema bilaterally. 2+DP/PT pulse bilaterally. Musculoskeletal: No joint deformities, No joint redness or warmth, no limitation of ROM in spin. Skin: No rashes.  Neuro: Alert, oriented X3, cranial nerves II-XII grossly intact, moves all extremities normally. Psych: Patient is not psychotic, no suicidal or hemocidal ideation.  Labs on Admission: I have personally reviewed following labs and imaging studies  CBC: Recent Labs  Lab 01/29/18 0909 01/14/2018 0142  WBC 8.4 8.1  NEUTROABS 5.9 5.2  HGB  --  5.1*  HCT 23.1* 17.3*  MCV 89.9 96.1  PLT 226 867   Basic Metabolic Panel: Recent Labs  Lab 01/29/18 0909 02/01/2018 0142  NA 140 141  K 4.2 5.0  CL 107 110  CO2 24 14*  GLUCOSE 87 91  BUN  22 68*  CREATININE 0.67* 1.80*  CALCIUM 9.3 8.1*   GFR: Estimated Creatinine Clearance: 31.4 mL/min (A) (by C-G formula based on SCr of 1.8 mg/dL (H)). Liver Function Tests: Recent Labs  Lab 01/29/18 0909 01/21/2018 0142  AST 58* 206*  ALT 26 72*  ALKPHOS 420* 336*  BILITOT 0.3 0.4  PROT 5.4* 4.0*  ALBUMIN 2.3* 1.9*   Recent Labs  Lab 01/09/2018 0142  LIPASE 29   No results for input(s): AMMONIA in the last 168 hours. Coagulation Profile: Recent Labs  Lab 01/29/2018 0142  INR 1.52   Cardiac Enzymes: No results for input(s): CKTOTAL, CKMB, CKMBINDEX, TROPONINI in the last 168 hours. BNP (last 3 results) No results for input(s): PROBNP in the last 8760 hours. HbA1C: No results for input(s): HGBA1C in the last 72 hours. CBG:  No results for input(s): GLUCAP in the last 168 hours. Lipid Profile: No results for input(s): CHOL, HDL, LDLCALC, TRIG, CHOLHDL, LDLDIRECT in the last 72 hours. Thyroid Function Tests: No results for input(s): TSH, T4TOTAL, FREET4, T3FREE, THYROIDAB in the last 72 hours. Anemia Panel: No results for input(s): VITAMINB12, FOLATE, FERRITIN, TIBC, IRON, RETICCTPCT in the last 72 hours. Urine analysis:    Component Value Date/Time   COLORURINE YELLOW 12/18/2017 [place] 12/18/2017 1315   LABSPEC 1.013 12/18/2017 1315   PHURINE 6.0 12/18/2017 1315   GLUCOSEU NEGATIVE 12/18/2017 1315   HGBUR NEGATIVE 12/18/2017 [place] 12/18/2017 1315   [place] 12/18/2017 1315   PROTEINUR NEGATIVE 12/18/2017 1315   NITRITE NEGATIVE 12/18/2017 1315   LEUKOCYTESUR NEGATIVE 12/18/2017 1315   Sepsis Labs: @LABRCNTIP (procalcitonin:4,lacticidven:4) )No results found for this or any previous visit (from the past 240 hour(s)).   Radiological Exams on Admission: Dg Abd 1 View  Result Date: 01/10/2018 CLINICAL DATA:  Difficulty breathing, lower abdominal pain. EXAM: ABDOMEN - 1 VIEW COMPARISON:  None. FINDINGS:  Nonobstructive bowel gas pattern. Hepatomegaly likely present, likely related to the bulky hepatic metastases seen on prior CT. No free air. IMPRESSION: No obstruction or free air. Probable hepatomegaly. This is likely related to the bulky metastatic disease seen on prior CT. Electronically Signed   By: Rolm Baptise M.D.   On: 01/09/2018 01:59   Dg Chest Portable 1 View  Result Date: 02/04/2018 CLINICAL DATA:  Shortness of Breath EXAM: PORTABLE CHEST 1 VIEW COMPARISON:  Chest CT 01/20/2018 FINDINGS: Right Port-A-Cath tip is in the SVC. Heart is normal size. No confluent airspace opacities or effusions. No acute bony abnormality. IMPRESSION: No active cardiopulmonary disease. Electronically Signed   By: Rolm Baptise M.D.   On: 01/14/2018 01:58     EKG: Independently reviewed.  Sinus rhythm, QTC 559, low voltage, nonspecific T-wave change.   Assessment/Plan Principal Problem:   GIB (gastrointestinal bleeding) Active Problems:   Essential hypertension   Esophageal cancer, stage IV (HCC)   Abnormal LFTs   AKI (acute kidney injury) (HCC)   Hypotension   Acute blood loss anemia   COPD (chronic obstructive pulmonary disease) (HCC)   Depression   GIB (gastrointestinal bleeding): likely upper GI bleeding, probably related to esophageal cancer. Patient was initially hypotensive with blood pressure 62/38, which improved to 92/50 after 2 L normal saline bolus and 1 unit to urgent blood transfusion. Currently he is hemodynamically stable.  - will admit to tele bed as inpt - transfuse totally 4 units of blood - NPO  - IVF: 2L NS bolus, then at 125 mL/hr - Start IV pantoprazole gtt - Avoid NSAIDs and SQ heparin - hold ASA - Maintain IV access (2 large bore IVs if possible). - Monitor closely and follow q6h cbc, transfuse as necessary, if Hgb<7.0 - LaB: INR, PTT and type screen - please call GI in AM (pt had EGD by Dr. Fuller Plan recently)  Essential hypertension: Not taking medication at home.  Will not add oral medication due to hypotension -IV hydralazine when necessary  AKI (acute kidney injury): Cre 1.80 and BUN 68, possibly due to ATN 2/2 hypotension. -IVF as above -f/u renal Fx by BMP  Abnormal LFTs: most likely due to liver metastasize disease -Avoid using newer toxic medications, such as Tylenol  Hypotension: has resolved. -continue IVF as above  Acute blood loss anemia: -transfusing 4 U of blood  Depression: Stable, no suicidal or homicidal ideations. -hold Remeron  due to QTc prolongation  COPD (chronic obstructive pulmonary disease) (HCC) -continue Anoro Ellipta inhaler -prn albuterol nebs  DVT ppx: SCD Code Status: Full code Family Communication: None at bed side. Disposition Plan:  Anticipate discharge back to previous home environment Consults called:  none Admission status:  SDU/inpation       Date of Service 02/04/2018    Ivor Costa [facility] Pager [phone]  If 7PM-7AM, please contact night-coverage [email or web address] Password [facility] 01/25/2018, 5:21 AM

## 2018-02-02 NOTE — ED Notes (Signed)
Admitting provider bedside 

## 2018-02-02 NOTE — Anesthesia Preprocedure Evaluation (Addendum)
Anesthesia Evaluation  Patient identified by MRN, date of birth, ID band Patient awake    Reviewed: Allergy & Precautions, H&P , NPO status , Patient's Chart, lab work & pertinent test results  Airway Mallampati: II  TM Distance: >3 FB Neck ROM: Full    Dental no notable dental hx. (+) Edentulous Upper, Partial Lower, Dental Advisory Given   Pulmonary COPD,  oxygen dependent, former smoker,    Pulmonary exam normal breath sounds clear to auscultation       Cardiovascular hypertension, Pt. on medications  Rhythm:Regular Rate:Normal     Neuro/Psych Depression negative neurological ROS     GI/Hepatic negative GI ROS, Neg liver ROS,   Endo/Other  negative endocrine ROS  Renal/GU Renal InsufficiencyRenal disease  negative genitourinary   Musculoskeletal   Abdominal   Peds  Hematology negative hematology ROS (+) anemia ,   Anesthesia Other Findings   Reproductive/Obstetrics negative OB ROS                            Anesthesia Physical Anesthesia Plan  ASA: III  Anesthesia Plan: MAC   Post-op Pain Management:    Induction: Intravenous  PONV Risk Score and Plan: 1 and Propofol infusion  Airway Management Planned: Nasal Cannula  Additional Equipment:   Intra-op Plan:   Post-operative Plan:   Informed Consent: I have reviewed the patients History and Physical, chart, labs and discussed the procedure including the risks, benefits and alternatives for the proposed anesthesia with the patient or authorized representative who has indicated his/her understanding and acceptance.   Dental advisory given  Plan Discussed with: CRNA  Anesthesia Plan Comments:         Anesthesia Quick Evaluation

## 2018-02-02 NOTE — ED Provider Notes (Signed)
TIME SEEN: 1:37 AM  CHIEF COMPLAINT: Upper GI bleed  HPI: Patient is a 72 year old male with history of metastatic esophageal cancer, anemia requiring blood transfusion, COPD, hypertension who presents to the emergency department with upper GI bleed.  Patient states that he has been having several days of black tarry stools and began vomiting coffee-ground emesis tonight.  Found to be hypotensive with a systolic blood pressure in the 60s with EMS.  Patient was complaining of shortness of breath but this has resolved.  No chest pain.  No current abdominal pain.  Is getting radiation for his esophageal cancer.  States he finished chemotherapy 2 weeks ago.  His oncologist is Dr. Burr Medico.  His PCP is Dr. Dennard Schaumann.  He states he does not have a gastroenterologist.  He denies history of alcohol abuse.  States that he used to drink heavily years ago but does not anymore.  It appears on reviewing patient's chart he actually received a blood transfusion on the 21st.    ROS: See HPI Constitutional: no fever  Eyes: no drainage  ENT: no runny nose   Cardiovascular:  no chest pain  Resp: no SOB  GI: vomiting GU: no dysuria Integumentary: no rash  Allergy: no hives  Musculoskeletal: no leg swelling  Neurological: no slurred speech ROS otherwise negative  PAST MEDICAL HISTORY/PAST SURGICAL HISTORY:  Past Medical History:  Diagnosis Date  . Anemia   . Cancer ([place])   . COPD (chronic obstructive pulmonary disease) ([place])   . History of blood transfusion   . Hypertension     MEDICATIONS:  Prior to Admission medications   Medication Sig Start Date End Date Taking? Authorizing Provider  aspirin 81 MG tablet Take 81 mg by mouth daily.    [provider]  Ferrous Sulfate (IRON) 325 (65 Fe) MG TABS Take 1 tablet by mouth daily.    [provider]  lidocaine-prilocaine (EMLA) cream Apply to portacath site 1-2 hours prior to use. 11/20/17   Owens Shark, NP  mirtazapine (REMERON) 7.5 MG  tablet Take 1 tablet (7.5 mg total) by mouth at bedtime. 11/20/17   Truitt Merle, MD  OXYGEN Inhale 2 L as needed into the lungs (for shortness of breath).     [provider]  pantoprazole (PROTONIX) 40 MG tablet Take 40 mg 2 (two) times daily by mouth. 10/10/17   [provider]  prochlorperazine (COMPAZINE) 10 MG tablet Take 1 tablet (10 mg total) by mouth every 6 (six) hours as needed for nausea or vomiting. 11/05/17   Owens Shark, NP  umeclidinium-vilanterol (ANORO ELLIPTA) 62.5-25 MCG/INH AEPB Inhale 1 puff into the lungs daily. 11/29/16   Susy Frizzle, MD    ALLERGIES:  No Known Allergies  SOCIAL HISTORY:  Social History   Tobacco Use  . Smoking status: Former Smoker    Packs/day: 0.50    Years: 25.00    Pack years: 12.50    Types: Cigarettes    Last attempt to quit: 11/08/2016    Years since quitting: 1.2  . Smokeless tobacco: Never Used  Substance Use Topics  . Alcohol use: No    FAMILY HISTORY: No family history on file.  EXAM: BP (!) 62/38 (BP Location: Left Arm)   Pulse (!) 106   Temp (!) 96.7 F (35.9 C) (Temporal)   Resp 18   Ht 5\' 9"  (1.753 m)   Wt 60.8 kg (134 lb)   SpO2 (!) 73% Comment: pt placed on 2L [place], sat up  to 90%  BMI 19.79 kg/m  CONSTITUTIONAL: Alert and oriented and responds appropriately to questions.  Chronically ill-appearing.  Patient is holding an emesis bag with approximately 150 mL of coffee-ground emesis. HEAD: Normocephalic EYES: Conjunctivae clear, pupils appear equal, EOMI pale conjunctiva ENT: normal nose; moist mucous membranes NECK: Supple, no meningismus, no nuchal rigidity, no LAD  CARD: RRR; S1 and S2 appreciated; no murmurs, no clicks, no rubs, no gallops RESP: Normal chest excursion without splinting or tachypnea; breath sounds clear and equal bilaterally; no wheezes, no rhonchi, no rales, no hypoxia or respiratory distress, speaking full sentences ABD/GI: Normal bowel sounds; non-distended; soft,  non-tender, no rebound, no guarding, no peritoneal signs, patient does have hepatomegaly on my examination BACK:  The back appears normal and is non-tender to palpation, there is no CVA tenderness EXT: Normal ROM in all joints; non-tender to palpation; no edema; normal capillary refill; no cyanosis, no calf tenderness or swelling    SKIN: Normal color for age and race; warm; no rash NEURO: Moves all extremities equally PSYCH: The patient's mood and manner are appropriate. Grooming and personal hygiene are appropriate.  MEDICAL DECISION MAKING: Patient here with upper GI bleed.  No history of esophageal varices.  He denies previous GI bleed but has a history of symptomatic anemia and states his last transfusion was in the past couple of weeks.  Last hemoglobin 4 days ago was 7.2.  He is on aspirin but no other antiplatelet or anticoagulant.  Abdominal exam is benign.  Will give 2 units of emergent blood given he is hypotensive here.  Labs pending.  Will start IV fluids and give Protonix bolus and Protonix drip.  He will need admission.  ED PROGRESS: Patient has not had any further vomiting in the emergency department and states feeling better after Protonix.  He received 2 units of emergent blood in his blood pressure has improved to the 25K systolic.  Hemoglobin of 5.1.  We will give him another 2 units of typed and crossed blood in the ED.  He will need admission to stepdown.   3:27 AM Discussed patient's case with hospitalist, Dr. Blaine Hamper.  I have recommended admission and patient (and family if present) agree with this plan. Admitting physician will place admission orders.   I reviewed all nursing notes, vitals, pertinent previous records, EKGs, lab and urine results, imaging (as available).      EKG Interpretation  Date/Time:  Monday February 02 2018 01:33:20 EST Ventricular Rate:  102 PR Interval:    QRS Duration: 93 QT Interval:  429 QTC Calculation: 559 R Axis:   75 Text  Interpretation:  Sinus tachycardia Low voltage, precordial leads Borderline T abnormalities, anterior leads Prolonged QT interval No significant change since last tracing Confirmed by Pryor Curia [phone]) on 01/23/2018 1:37:31 AM        CRITICAL CARE Performed by: Cyril Mourning Makhiya Coburn   Total critical care time: 45 minutes  Critical care time was exclusive of separately billable procedures and treating other patients.  Critical care was necessary to treat or prevent imminent or life-threatening deterioration.  Critical care was time spent personally by me on the following activities: development of treatment plan with patient and/or surrogate as well as nursing, discussions with consultants, evaluation of patient's response to treatment, examination of patient, obtaining history from patient or surrogate, ordering and performing treatments and interventions, ordering and review of laboratory studies, ordering and review of radiographic studies, pulse oximetry and re-evaluation of patient's condition.    Ygnacio Fecteau,  Delice Bison, DO 01/29/2018 [phone]

## 2018-02-02 NOTE — Transfer of Care (Signed)
Immediate Anesthesia Transfer of Care Note  Patient: William Villegas  Procedure(s) Performed: ESOPHAGOGASTRODUODENOSCOPY (EGD) (N/A )  Patient Location: Endoscopy Unit  Anesthesia Type:MAC  Level of Consciousness: awake, alert  and oriented  Airway & Oxygen Therapy: Patient Spontanous Breathing and Patient connected to nasal cannula oxygen  Post-op Assessment: Report given to RN, Post -op Vital signs reviewed and stable and Patient moving all extremities X 4  Post vital signs: Reviewed and stable  Last Vitals:  Vitals:   02/01/2018 1435 01/26/2018 1437  BP:    Pulse:    Resp:    Temp:    SpO2: 99% 100%    Last Pain:  Vitals:   01/13/2018 1428  TempSrc: Oral  PainSc:          Complications: No apparent anesthesia complications

## 2018-02-02 NOTE — ED Triage Notes (Signed)
Pt transported from home by EMS after having difficulty breathing all day today and lower abd pain, just prior to arrival 171ml dark brown emesis. IV est, Zofran 4mg  given, 516ml NS bolus given.  Pt is a CA patient, pt appears pale.

## 2018-02-03 DIAGNOSIS — C160 Malignant neoplasm of cardia: Principal | ICD-10-CM

## 2018-02-03 DIAGNOSIS — S93402A Sprain of unspecified ligament of left ankle, initial encounter: Secondary | ICD-10-CM

## 2018-02-03 DIAGNOSIS — X58XXXA Exposure to other specified factors, initial encounter: Secondary | ICD-10-CM

## 2018-02-03 DIAGNOSIS — J449 Chronic obstructive pulmonary disease, unspecified: Secondary | ICD-10-CM

## 2018-02-03 DIAGNOSIS — Z515 Encounter for palliative care: Secondary | ICD-10-CM

## 2018-02-03 DIAGNOSIS — K922 Gastrointestinal hemorrhage, unspecified: Secondary | ICD-10-CM

## 2018-02-03 DIAGNOSIS — I959 Hypotension, unspecified: Secondary | ICD-10-CM

## 2018-02-03 LAB — TYPE AND SCREEN
ABO/RH(D): A POS
Antibody Screen: NEGATIVE
UNIT DIVISION: 0
Unit division: 0
Unit division: 0
Unit division: 0

## 2018-02-03 LAB — BASIC METABOLIC PANEL
Anion gap: 8 (ref 5–15)
BUN: 70 mg/dL — AB (ref 6–20)
CO2: 17 mmol/L — ABNORMAL LOW (ref 22–32)
Calcium: 8.3 mg/dL — ABNORMAL LOW (ref 8.9–10.3)
Chloride: 115 mmol/L — ABNORMAL HIGH (ref 101–111)
Creatinine, Ser: 1.62 mg/dL — ABNORMAL HIGH (ref 0.61–1.24)
GFR calc Af Amer: 47 mL/min — ABNORMAL LOW (ref >60)
GFR, EST NON AFRICAN AMERICAN: 40 mL/min — AB (ref >60)
GLUCOSE: 79 mg/dL (ref 65–99)
POTASSIUM: 4.9 mmol/L (ref 3.5–5.1)
Sodium: 140 mmol/L (ref 135–145)

## 2018-02-03 LAB — CBC
HEMATOCRIT: 29.1 % — AB (ref 39.0–52.0)
Hemoglobin: 9.2 g/dL — ABNORMAL LOW (ref 13.0–17.0)
MCH: 28.6 pg (ref 26.0–34.0)
MCHC: 31.6 g/dL (ref 30.0–36.0)
MCV: 90.4 fL (ref 78.0–100.0)
Platelets: 184 10*3/uL (ref 150–400)
RBC: 3.22 MIL/uL — ABNORMAL LOW (ref 4.22–5.81)
RDW: 20.6 % — AB (ref 11.5–15.5)
WBC: 8.6 10*3/uL (ref 4.0–10.5)

## 2018-02-03 LAB — BPAM RBC
BLOOD PRODUCT EXPIRATION DATE: 201903092359
BLOOD PRODUCT EXPIRATION DATE: 201903092359
Blood Product Expiration Date: 201903092359
Blood Product Expiration Date: 201903092359
ISSUE DATE / TIME: 201902250124
ISSUE DATE / TIME: 201902250947
ISSUE DATE / TIME: 201902250124
ISSUE DATE / TIME: 201902251630
Unit Type and Rh: 6200
Unit Type and Rh: 6200
Unit Type and Rh: 9500
Unit Type and Rh: 9500

## 2018-02-03 MED ORDER — SODIUM CHLORIDE 0.9% FLUSH
10.0000 mL | INTRAVENOUS | Status: DC | PRN
  Administered 2018-02-04: 10 mL
  Filled 2018-02-03: qty 40

## 2018-02-03 MED ORDER — ORAL CARE MOUTH RINSE
15.0000 mL | Freq: Two times a day (BID) | OROMUCOSAL | Status: DC
  Administered 2018-02-03 – 2018-02-05 (×3): 15 mL via OROMUCOSAL

## 2018-02-03 MED ORDER — PANTOPRAZOLE SODIUM 40 MG IV SOLR
40.0000 mg | Freq: Two times a day (BID) | INTRAVENOUS | Status: DC
  Administered 2018-02-03 – 2018-02-05 (×5): 40 mg via INTRAVENOUS
  Filled 2018-02-03 (×5): qty 40

## 2018-02-03 NOTE — Progress Notes (Signed)
Palliative Medicine consult noted. Due to high referral volume, there may be a delay seeing this patient. Please call the Palliative Medicine Team office at [phone] if recommendations are needed in the interim.  Thank you for inviting [place] to see this patient.  Marjie Skiff Stephanne Greeley, RN, BSN, [facility] Palliative Medicine Team 02/03/2018 8:23 AM Office [phone]

## 2018-02-03 NOTE — Care Management Note (Signed)
Case Management Note  Patient Details  Name: ADISON JERGER MRN: [id number] Date of Birth: 08/29/1946  Subjective/Objective:         Patient admitted from home with likely upper GI bleeding, probably related to esophageal cancer. IV protonix, IVF, transfusions. Will continue to follow for [facility] needs.            Action/Plan:   Expected Discharge Date:                  Expected Discharge Plan:  Home/Self Care  In-House Referral:     Discharge planning Services  CM Consult  Post Acute Care Choice:    Choice offered to:     DME Arranged:    DME Agency:     HH Arranged:    HH Agency:     Status of Service:  In process, will continue to follow  If discussed at Long Length of Stay Meetings, dates discussed:    Additional Comments:  Carles Collet, RN 02/03/2018, 3:38 PM

## 2018-02-03 NOTE — Progress Notes (Signed)
William Villegas   DOB:18-Feb-1946   CN#:[id number]   OQH#:[id number]  Oncology follow-up note  Subjective: Patient is well-known to me, under my care for his metastatic GE junction adenocarcinoma.  I saw him in my office last week, he received a blood transfusion for his worsening anemia.  No overt GI bleeding at that time.  He was admitted to physical hospital 2 days ago due to severe anemia, melena, and sprained left ankle.  He complains of left ankle pain, and mild intermittent epigastric pain.  He has been quite fatigued, cannot walk, has been n.p.o. due to the GI bleeding, just started liquid, complains of nausea after drinking liquids.  His son was at bedside.   Objective:  Vitals:   02/03/18 0425 02/03/18 1430  BP: 114/71 116/61  Pulse: (!) 109 (!) 102  Resp: 19 19  Temp: 98.3 F (36.8 C)   SpO2: 98% 100%    Body mass index is 23.55 kg/m.  Intake/Output Summary (Last 24 hours) at 02/03/2018 1742 Last data filed at 02/03/2018 1500 Gross per 24 hour  Intake 2751.67 ml  Output 200 ml  Net 2551.67 ml     Sclerae unicteric  Oropharynx clear  No peripheral adenopathy  Lungs clear -- no rales or rhonchi  Heart regular rate and rhythm  Abdomen benign  MSK no focal spinal tenderness, no peripheral edema  Neuro nonfocal    CBG (last 3)  No results for input(s): GLUCAP in the last 72 hours.   Labs:  Lab Results  Component Value Date   WBC 8.6 02/03/2018   HGB 9.2 (L) 02/03/2018   HCT 29.1 (L) 02/03/2018   MCV 90.4 02/03/2018   PLT 184 02/03/2018   NEUTROABS 5.2 01/20/2018   CMP Latest Ref Rng & Units 02/03/2018 01/19/2018 02/03/2018  Glucose 65 - 99 mg/dL 79 87 91  BUN 6 - 20 mg/dL 70(H) 72(H) 68(H)  Creatinine 0.61 - 1.24 mg/dL 1.62(H) 1.62(H) 1.80(H)  Sodium 135 - 145 mmol/L 140 139 141  Potassium 3.5 - 5.1 mmol/L 4.9 5.5(H) 5.0  Chloride 101 - 111 mmol/L 115(H) 113(H) 110  CO2 22 - 32 mmol/L 17(L) 16(L) 14(L)  Calcium 8.9 - 10.3 mg/dL 8.3(L) 8.1(L) 8.1(L)  Total  Protein 6.5 - 8.1 g/dL - - 4.0(L)  Total Bilirubin 0.3 - 1.2 mg/dL - - 0.4  Alkaline Phos 38 - 126 U/L - - 336(H)  AST 15 - 41 U/L - - 206(H)  ALT 17 - 63 U/L - - 72(H)     Urine Studies No results for input(s): UHGB, CRYS in the last 72 hours.  Invalid input(s): UACOL, UAPR, USPG, UPH, UTP, UGL, UKET, UBIL, UNIT, UROB, [place], UEPI, UWBC, [place], [place], [place], [place], [place]  Basic Metabolic Panel: Recent Labs  Lab 01/29/18 0909 02/03/2018 0142 01/25/2018 1014 02/03/18 0419  NA 140 141 139 140  K 4.2 5.0 5.5* 4.9  CL 107 110 113* 115*  CO2 24 14* 16* 17*  GLUCOSE 87 91 87 79  BUN 22 68* 72* 70*  CREATININE 0.67* 1.80* 1.62* 1.62*  CALCIUM 9.3 8.1* 8.1* 8.3*   GFR Estimated Creatinine Clearance: 38 mL/min (A) (by C-G formula based on SCr of 1.62 mg/dL (H)). Liver Function Tests: Recent Labs  Lab 01/29/18 0909 01/31/2018 0142  AST 58* 206*  ALT 26 72*  ALKPHOS 420* 336*  BILITOT 0.3 0.4  PROT 5.4* 4.0*  ALBUMIN 2.3* 1.9*   Recent Labs  Lab 01/14/2018 0142  LIPASE 29   No results  for input(s): AMMONIA in the last 168 hours. Coagulation profile Recent Labs  Lab 01/12/2018 0142  INR 1.52    CBC: Recent Labs  Lab 01/29/18 0909 01/20/2018 0142 01/27/2018 1014 01/25/2018 2204 02/03/18 1145  WBC 8.4 8.1 7.5 10.3 8.6  NEUTROABS 5.9 5.2  --   --   --   HGB  --  5.1* 7.1* 10.2* 9.2*  HCT 23.1* 17.3* 22.5* 31.2* 29.1*  MCV 89.9 96.1 89.3 88.6 90.4  PLT 226 336 249 216 184   Cardiac Enzymes: No results for input(s): CKTOTAL, CKMB, CKMBINDEX, TROPONINI in the last 168 hours. BNP: Invalid input(s): POCBNP CBG: No results for input(s): GLUCAP in the last 168 hours. D-Dimer No results for input(s): DDIMER in the last 72 hours. Hgb A1c No results for input(s): HGBA1C in the last 72 hours. Lipid Profile No results for input(s): CHOL, HDL, LDLCALC, TRIG, CHOLHDL, LDLDIRECT in the last 72 hours. Thyroid function studies No results for input(s): TSH, T4TOTAL, T3FREE, THYROIDAB in  the last 72 hours.  Invalid input(s): FREET3 Anemia work up No results for input(s): VITAMINB12, FOLATE, FERRITIN, TIBC, IRON, RETICCTPCT in the last 72 hours. Microbiology No results found for this or any previous visit (from the past 240 hour(s)).    Studies:  Dg Abd 1 View  Result Date: 01/30/2018 CLINICAL DATA:  Difficulty breathing, lower abdominal pain. EXAM: ABDOMEN - 1 VIEW COMPARISON:  None. FINDINGS: Nonobstructive bowel gas pattern. Hepatomegaly likely present, likely related to the bulky hepatic metastases seen on prior CT. No free air. IMPRESSION: No obstruction or free air. Probable hepatomegaly. This is likely related to the bulky metastatic disease seen on prior CT. Electronically Signed   By: Rolm Baptise M.D.   On: 01/14/2018 01:59   Dg Chest Portable 1 View  Result Date: 01/09/2018 CLINICAL DATA:  Shortness of Breath EXAM: PORTABLE CHEST 1 VIEW COMPARISON:  Chest CT 01/20/2018 FINDINGS: Right Port-A-Cath tip is in the SVC. Heart is normal size. No confluent airspace opacities or effusions. No acute bony abnormality. IMPRESSION: No active cardiopulmonary disease. Electronically Signed   By: Rolm Baptise M.D.   On: 01/18/2018 01:58    Assessment: 72 y.o. with metastatic GE junction adenocarcinoma, on chemo, admitted for upper GI bleeding from tumor, and severe anemia.  1.  Severe anemia secondary to GI bleeding from his GE junction tumor 2.  Metastatic esophageal cancer, recently progressed after first-line chemotherapy, last chemo about 3 weeks ago 3.  Hypertension/hypotension 4. AKI 5. COPD 6. Left ankle injury   Plan:  -He received blood transfusion, and IV fluids, kidney function is improving -Continue supportive care, including pain management -I encouraged him to drink liquid, and nutrition supplement.  He still has melena.  But no active bleeding from EGD yesterday. -We discussed the option of disposition.  The patient has limited social support, his son works  most times during the day, he has difficulty ambulating due to the left ankle sprain.  His current condition certainly would not make him a candidate for any anticancer treatment due to his poor PS and nutrition status.  We discussed the option of going home with hospice, so he can get some help at home.  He will discussed with his son. -If physical therapy recommends short-term rehab, he would also consider.  -If his overall condition does improve in the near future, I will see him back in my clinic and discuss cancer treatment.  He is scheduled to start second line immunotherapy Keytruda this stage, all  considered for now -I discussed with hospitalist Dr. Ree Kida and palliative NP Haynes Dage today   Truitt Merle, MD 02/03/2018  5:42 PM

## 2018-02-03 NOTE — Consult Note (Signed)
Consultation Note Date: 02/03/2018   Patient Name: William Villegas  DOB: 10-29-1946  MRN: [id number]  Age / Sex: 72 y.o., male  PCP: Susy Frizzle, MD Referring Physician: Cristal Ford, DO  Reason for Consultation: Establishing goals of care  HPI/Patient Profile: 72 y.o. male  with past medical history of stage IV adenocarcinoma of the esophagus on palliative chemo and COPD, who was admitted on 01/12/2018 with hematemesis and hematochezia.  His BP in the ER was 62/38 and he required urgent RBC transfusion and fluid resuscitation. Work up with EGD revealed a large fungating/ulcerated mass in his G/E junction and cardia.  CT scan show and increase in liver mets and a new left adrenal nodule.  Clinical Assessment and Goals of Care:  I have reviewed medical records including EPIC notes, labs and imaging, received report from the attending MD and Dr. Burr Medico of Oncology, assessed the patient and then met briefly at the bedside along with his son and ex-sister in law to introduce Palliative and schedule an appointment for further discussion.  I introduced Palliative Medicine as specialized medical care for people living with serious illness. It focuses on providing relief from the symptoms and stress of a serious illness. The goal is to improve quality of life for both the patient and the family.  The patient appears exhausted and does not have the energy to have a complex conversation.  We agreed with the patient and his son to meet in his room tomorrow morning at 7:30 am.   Primary Decision Maker:  PATIENT    SUMMARY OF RECOMMENDATIONS    PMT meeting 7:30 am on 2/27 for a discussion of Goals of Care, code status, and hospice options.   Prognosis: very poor given stage 4 metastatic cancer progressing despite chemo, GI bleeding, known bleeding ulcerated stomach tumor, very limited mobility, very poor PO  intake.    Discharge Planning: To Be Determined      Primary Diagnoses: Present on Admission: . Essential hypertension . Esophageal cancer, stage IV ([place]) . GIB (gastrointestinal bleeding) . Abnormal LFTs . AKI (acute kidney injury) ([place]) . Hypotension . Acute blood loss anemia . COPD (chronic obstructive pulmonary disease) ([place]) . Depression   I have reviewed the medical record, interviewed the patient and family, and examined the patient. The following aspects are pertinent.  Past Medical History:  Diagnosis Date  . Anemia   . Cancer ([place])   . COPD (chronic obstructive pulmonary disease) ([place])   . History of blood transfusion   . Hypertension    Social History   Socioeconomic History  . Marital status: Divorced    Spouse name: None  . Number of children: None  . Years of education: None  . Highest education level: None  Social Needs  . Financial resource strain: None  . Food insecurity - worry: None  . Food insecurity - inability: None  . Transportation needs - medical: None  . Transportation needs - non-medical: None  Occupational History  . None  Tobacco Use  .  Smoking status: Former Smoker    Packs/day: 0.50    Years: 25.00    Pack years: 12.50    Types: Cigarettes    Last attempt to quit: 11/08/2016    Years since quitting: 1.2  . Smokeless tobacco: Never Used  Substance and Sexual Activity  . Alcohol use: No  . Drug use: No  . Sexual activity: None  Other Topics Concern  . None  Social History Narrative  . None   Family History  Problem Relation Age of Onset  . Heart disease Mother   . Heart disease Father    Scheduled Meds: . ferrous sulfate  325 mg Oral Daily  . mouth rinse  15 mL Mouth Rinse BID  . pantoprazole (PROTONIX) IV  40 mg Intravenous Q12H  . umeclidinium-vilanterol  1 puff Inhalation Daily   Continuous Infusions: . sodium chloride 125 mL/hr at 02/03/18 0635   PRN Meds:.albuterol, lidocaine-prilocaine, morphine  injection, sodium chloride flush, zolpidem No Known Allergies Review of Systems fatigue, weakness, GI bleeding.  Physical Exam  Chronically ill appearing male, poor dentition, appears fatigued, awake, alert, flat affect.  Vital Signs: BP 114/71 (BP Location: Left Arm)   Pulse (!) 109   Temp 98.3 F (36.8 C) (Oral)   Resp 19   Ht '5\' 7"'$  (1.702 m)   Wt 68.2 kg (150 lb 5.7 oz)   SpO2 98%   BMI 23.55 kg/m  Pain Assessment: No/denies pain   Pain Score: 0-No pain   SpO2: SpO2: 98 % O2 Device:SpO2: 98 % O2 Flow Rate: .O2 Flow Rate (L/min): 2 L/min  IO: Intake/output summary:   Intake/Output Summary (Last 24 hours) at 02/03/2018 1358 Last data filed at 02/03/2018 8833 Gross per 24 hour  Intake 1500 ml  Output 200 ml  Net 1300 ml    LBM: Last BM Date: 01/21/2018 Baseline Weight: Weight: 60.8 kg (134 lb) Most recent weight: Weight: 68.2 kg (150 lb 5.7 oz)     Palliative Assessment/Data:40%     Time In: 3:00 Time Out: 3:30 Time Total: 30 MIN. Greater than 50%  of this time was spent counseling and coordinating care related to the above assessment and plan.  Signed by: Florentina Jenny, PA-C Palliative Medicine Pager: [phone]  Please contact Palliative Medicine Team phone at [phone] for questions and concerns.  For individual provider: See Shea Evans

## 2018-02-03 NOTE — Progress Notes (Signed)
PROGRESS NOTE    ARIA PICKRELL  TDV:[id number] DOB: Nov 23, 1946 DOA: 01/30/2018 PCP: Susy Frizzle, MD   Brief Narrative:  HPI on 01/21/2018 by Dr. Ivor Costa JAEVIN MEDEARIS is a 72 y.o. male with medical history significant of hypertension, COPD, GERD, depression, stage IV metastasized esophageal cancer, who presents with hematemesis and hematochezia.  Patient states that he has been having dark stool diarrhea in the past 3 days. Last night he had 1 large amount of hematemesis with dark blood. He had mild lower abdominal pain, which has resolved. He has mild dizziness, and shortness of breath, no chest pain. He has mild cough, no fever or chills. He states that sometimes he has mild dysuria. No unilateral weakness. Patient was initially hypotensive with blood pressure 62/38, which improved to 92/50 after 2 L normal saline bolus and 1 unit to urgent blood transfusion.  Interim history Patient admitted for GI bleeding, gastroenterology consulted and appreciated, s/p EGD.  Assessment & Plan   GI bleed/Acute blood loss anemia/Acute on chronic anemia -Patient presented with hematemesis and melena -Patient was seen and had an EGD November 2018 which showed an esophageal mass -Patient does have history of stage IV esophageal cancer -Was on Protonix GTT, will transition to PPI BID -Presented with hemoglobin 5.1, was given blood transfusion -baseline hemoglobin 8, currently 10.2 -Continue to monitor CBC -Gastroenterology consulted and appreciated -EGD showed partially obstructing, malignant esophageal tumor was found at the GE junction and the cardia.  Likely source of recent bleeding.  Gastroenterology recommended full liquid diet along with PPI twice daily.  Metastatic esophageal carcinoma -patient follows with Dr. Burr Medico, oncology, and states that he is supposed to start a new treatment on Thursday of this week -Dr. Burr Medico, consulted and appreciated -Palliative care consulted and  appreciated to discuss goals of care  Essential hypertension /hypotension -currently not on any medications at home and has hypotension likely secondary to blood loss -Continue to monitor closely  Acute kidney injury -Creatinine 1.80 on admission, currently 1.62 -Suspect secondary to blood loss  -continue to monitor BMP  Abnormal LFTs -Likely due to liver metastasis -continue to monitor  Depression -Remeron currently held  COPD -Appears stable, no wheezing -Continue oxygen -Continue albuterol nebs  DVT Prophylaxis  SCDs  Code Status: Full  Family Communication: None at bedside  Disposition Plan: Admitted, pending oncology consultation, dispo TBD  Consultants Gastroenterology  Procedures  None  Antibiotics   Anti-infectives (From admission, onward)   None      Subjective:   Freada Bergeron seen and examined today.  Patient states that his stomach hurts, and he is unable to eat or drink much.  Denies current chest pain or shortness of breath, nausea or vomiting.  Objective:   Vitals:   01/19/2018 2030 02/04/2018 2100 01/22/2018 2135 02/03/18 0425  BP: (!) 111/59 (!) 103/58 109/65 114/71  Pulse: 94 (!) 104 (!) 102 (!) 109  Resp: (!) 21 (!) 22 (!) 22 19  Temp:   99.5 F (37.5 C) 98.3 F (36.8 C)  TempSrc:   Oral Oral  SpO2: 100% 94% 98% 98%  Weight:   68.2 kg (150 lb 5.7 oz)   Height:   5\' 7"  (1.702 m)     Intake/Output Summary (Last 24 hours) at 02/03/2018 1231 Last data filed at 02/03/2018 0626 Gross per 24 hour  Intake 1500 ml  Output 200 ml  Net 1300 ml   Filed Weights   02/04/2018 0130 01/30/2018 1324 01/25/2018 2135  Weight: 60.8  kg (134 lb) 60.8 kg (134 lb) 68.2 kg (150 lb 5.7 oz)   Exam  General: Well developed, chronically ill-appearing, thin, NAD  HEENT: NCAT, mucous membranes moist.   Neck: Supple  Cardiovascular: S1 S2 auscultated, RRR, no murmurs  Respiratory: Clear to auscultation bilaterally with equal chest rise  Abdomen: Soft,  nontender, nondistended, + bowel sounds  Extremities: warm dry without cyanosis clubbing. LE edema  Neuro: AAOx3, nonfocal  Psych: appropriate  Data Reviewed: I have personally reviewed following labs and imaging studies  CBC: Recent Labs  Lab 01/29/18 0909 01/29/2018 0142 02/03/2018 1014 01/21/2018 2204  WBC 8.4 8.1 7.5 10.3  NEUTROABS 5.9 5.2  --   --   HGB  --  5.1* 7.1* 10.2*  HCT 23.1* 17.3* 22.5* 31.2*  MCV 89.9 96.1 89.3 88.6  PLT 226 336 249 315   Basic Metabolic Panel: Recent Labs  Lab 01/29/18 0909 01/28/2018 0142 02/01/2018 1014 02/03/18 0419  NA 140 141 139 140  K 4.2 5.0 5.5* 4.9  CL 107 110 113* 115*  CO2 24 14* 16* 17*  GLUCOSE 87 91 87 79  BUN 22 68* 72* 70*  CREATININE 0.67* 1.80* 1.62* 1.62*  CALCIUM 9.3 8.1* 8.1* 8.3*   GFR: Estimated Creatinine Clearance: 38 mL/min (A) (by C-G formula based on SCr of 1.62 mg/dL (H)). Liver Function Tests: Recent Labs  Lab 01/29/18 0909 01/14/2018 0142  AST 58* 206*  ALT 26 72*  ALKPHOS 420* 336*  BILITOT 0.3 0.4  PROT 5.4* 4.0*  ALBUMIN 2.3* 1.9*   Recent Labs  Lab 01/15/2018 0142  LIPASE 29   No results for input(s): AMMONIA in the last 168 hours. Coagulation Profile: Recent Labs  Lab 02/04/2018 0142  INR 1.52   Cardiac Enzymes: No results for input(s): CKTOTAL, CKMB, CKMBINDEX, TROPONINI in the last 168 hours. BNP (last 3 results) No results for input(s): PROBNP in the last 8760 hours. HbA1C: No results for input(s): HGBA1C in the last 72 hours. CBG: No results for input(s): GLUCAP in the last 168 hours. Lipid Profile: No results for input(s): CHOL, HDL, LDLCALC, TRIG, CHOLHDL, LDLDIRECT in the last 72 hours. Thyroid Function Tests: No results for input(s): TSH, T4TOTAL, FREET4, T3FREE, THYROIDAB in the last 72 hours. Anemia Panel: No results for input(s): VITAMINB12, FOLATE, FERRITIN, TIBC, IRON, RETICCTPCT in the last 72 hours. Urine analysis:    Component Value Date/Time   COLORURINE YELLOW  02/03/2018 1815   APPEARANCEUR CLOUDY (A) 01/10/2018 1815   LABSPEC 1.016 01/20/2018 1815   PHURINE 5.0 02/01/2018 1815   GLUCOSEU NEGATIVE 01/10/2018 1815   HGBUR SMALL (A) 01/11/2018 1815   BILIRUBINUR NEGATIVE 02/01/2018 1815   KETONESUR NEGATIVE 01/17/2018 1815   PROTEINUR NEGATIVE 02/03/2018 1815   NITRITE NEGATIVE 01/19/2018 1815   LEUKOCYTESUR NEGATIVE 01/09/2018 1815   Sepsis Labs: @LABRCNTIP (procalcitonin:4,lacticidven:4)  )No results found for this or any previous visit (from the past 240 hour(s)).    Radiology Studies: Dg Abd 1 View  Result Date: 01/09/2018 CLINICAL DATA:  Difficulty breathing, lower abdominal pain. EXAM: ABDOMEN - 1 VIEW COMPARISON:  None. FINDINGS: Nonobstructive bowel gas pattern. Hepatomegaly likely present, likely related to the bulky hepatic metastases seen on prior CT. No free air. IMPRESSION: No obstruction or free air. Probable hepatomegaly. This is likely related to the bulky metastatic disease seen on prior CT. Electronically Signed   By: Rolm Baptise M.D.   On: 01/25/2018 01:59   Dg Chest Portable 1 View  Result Date: 01/16/2018 CLINICAL DATA:  Shortness  of Breath EXAM: PORTABLE CHEST 1 VIEW COMPARISON:  Chest CT 01/20/2018 FINDINGS: Right Port-A-Cath tip is in the SVC. Heart is normal size. No confluent airspace opacities or effusions. No acute bony abnormality. IMPRESSION: No active cardiopulmonary disease. Electronically Signed   By: Rolm Baptise M.D.   On: 02/04/2018 01:58     Scheduled Meds: . ferrous sulfate  325 mg Oral Daily  . mouth rinse  15 mL Mouth Rinse BID  . umeclidinium-vilanterol  1 puff Inhalation Daily   Continuous Infusions: . sodium chloride 125 mL/hr at 02/03/18 0635  . pantoprozole (PROTONIX) infusion 8 mg/hr (02/03/18 0635)     LOS: 1 day   Time Spent in minutes   30 minutes  Alexsys Eskin D.O. on 02/03/2018 at 12:31 PM  Between 7am to 7pm - Pager - [phone]  After 7pm go to [email or web address] - password  [email or web address]  And look for the night coverage person covering for me after hours  [facility] Office  [phone]

## 2018-02-04 ENCOUNTER — Inpatient Hospital Stay (HOSPITAL_COMMUNITY): Payer: Medicare PPO

## 2018-02-04 DIAGNOSIS — M25572 Pain in left ankle and joints of left foot: Secondary | ICD-10-CM

## 2018-02-04 LAB — CBC
HEMATOCRIT: 25.3 % — AB (ref 39.0–52.0)
Hemoglobin: 8.2 g/dL — ABNORMAL LOW (ref 13.0–17.0)
MCH: 29.6 pg (ref 26.0–34.0)
MCHC: 32.4 g/dL (ref 30.0–36.0)
MCV: 91.3 fL (ref 78.0–100.0)
PLATELETS: 134 10*3/uL — AB (ref 150–400)
RBC: 2.77 MIL/uL — ABNORMAL LOW (ref 4.22–5.81)
RDW: 20.6 % — AB (ref 11.5–15.5)
WBC: 7 10*3/uL (ref 4.0–10.5)

## 2018-02-04 LAB — BASIC METABOLIC PANEL
Anion gap: 7 (ref 5–15)
BUN: 59 mg/dL — AB (ref 6–20)
CHLORIDE: 113 mmol/L — AB (ref 101–111)
CO2: 19 mmol/L — ABNORMAL LOW (ref 22–32)
Calcium: 8.5 mg/dL — ABNORMAL LOW (ref 8.9–10.3)
Creatinine, Ser: 1.54 mg/dL — ABNORMAL HIGH (ref 0.61–1.24)
GFR, EST AFRICAN AMERICAN: 50 mL/min — AB (ref >60)
GFR, EST NON AFRICAN AMERICAN: 43 mL/min — AB (ref >60)
Glucose, Bld: 86 mg/dL (ref 65–99)
POTASSIUM: 4.6 mmol/L (ref 3.5–5.1)
SODIUM: 139 mmol/L (ref 135–145)

## 2018-02-04 MED ORDER — LORAZEPAM 2 MG/ML PO CONC
0.2500 mg | Freq: Two times a day (BID) | ORAL | Status: DC
  Administered 2018-02-04 – 2018-02-05 (×2): 0.26 mg via ORAL
  Filled 2018-02-04 (×2): qty 1

## 2018-02-04 MED ORDER — MORPHINE SULFATE (CONCENTRATE) 10 MG/0.5ML PO SOLN: 10.0000 mg | ORAL | Status: DC | PRN

## 2018-02-04 MED ORDER — LORAZEPAM 2 MG/ML IJ SOLN: 0.5000 mg | Freq: Four times a day (QID) | INTRAMUSCULAR | Status: DC | PRN

## 2018-02-04 MED ORDER — SUCRALFATE 1 GM/10ML PO SUSP
1.0000 g | Freq: Three times a day (TID) | ORAL | Status: DC
  Administered 2018-02-04 – 2018-02-05 (×2): 1 g via ORAL
  Filled 2018-02-04 (×3): qty 10

## 2018-02-04 MED ORDER — MORPHINE SULFATE (PF) 2 MG/ML IV SOLN: 2.0000 mg | INTRAVENOUS | Status: DC | PRN

## 2018-02-04 MED ORDER — ENSURE ENLIVE PO LIQD
237.0000 mL | Freq: Three times a day (TID) | ORAL | Status: DC
  Administered 2018-02-04 – 2018-02-05 (×2): 237 mL via ORAL

## 2018-02-04 MED ORDER — COLCHICINE 0.6 MG PO TABS: 0.6000 mg | ORAL_TABLET | Freq: Once | ORAL | Status: DC

## 2018-02-04 MED ORDER — MORPHINE SULFATE (CONCENTRATE) 10 MG/0.5ML PO SOLN
10.0000 mg | Freq: Two times a day (BID) | ORAL | Status: DC
  Administered 2018-02-04 – 2018-02-05 (×2): 10 mg via ORAL
  Filled 2018-02-04 (×2): qty 0.5

## 2018-02-04 MED ORDER — ONDANSETRON HCL 4 MG/2ML IJ SOLN
4.0000 mg | Freq: Three times a day (TID) | INTRAMUSCULAR | Status: DC
  Administered 2018-02-05 (×3): 4 mg via INTRAVENOUS
  Filled 2018-02-04 (×3): qty 2

## 2018-02-04 MED ORDER — ACETAMINOPHEN 160 MG/5ML PO SOLN
650.0000 mg | Freq: Four times a day (QID) | ORAL | Status: DC
  Administered 2018-02-04 – 2018-02-05 (×4): 650 mg via ORAL
  Filled 2018-02-04 (×4): qty 20.3

## 2018-02-04 NOTE — Progress Notes (Signed)
Daily Progress Note   Patient Name: William Villegas       Date: 02/04/2018 DOB: [birth year]  Age: 72 y.o. MRN#: [id number] Attending Physician: Velvet Bathe, MD Primary Care Physician: Susy Frizzle, MD Admit Date: 02/03/2018  Reason for Consultation/Follow-up: Establishing goals of care, Hospice Evaluation and Psychosocial/spiritual support  Subjective: Spoke with patient and son (who is 36) at bedside.  The patient actually has 5 children.  He worked for the [employer] for 30 years and retired 11 years ago.  His COPD has become significant.  He's on 2l of oxygen at home which allows him to walk from room to room but he is unable to do much more.  He stopped smoking November 2017 after a bad COPD exacerbation that hospitalized him.  His son at bedside is his sole care taker.  The most of family lives in [place].  His son talks of caring for his father - he had been weak and experiencing nausea and vomiting PTA.  If he ate (even a bite or two) he had to be in the bathroom because he would have a bowel movement or vomit.    Finding him on the floor in a massive amount of black liquid unresponsive - was quite traumatizing.  We discussed prognosis.  If he has a big re-bleed he could go very quickly.  Otherwise if he keeps drinking liquids and is able to get up he likely has weeks to maybe months..  Per the son - the father's will to live is very poor at the moment and he refuses to eat any solid food.   He is unable to get up due to his left ankle.    The ankle is swollen and the patient asks me not to even touch it.   The patient's only request is that we improve his ankle so that he can stand up again.  Patient describes intense pain in his left ankle and aching that is continuous in his right  lower quadrant (made worse by eating/drinking).  In the past the patient has refused SNF.  We discussed the option of SNF vs Hospice House.  The son has to work so the patient would be at home alone most of the day even if Hospice visited him at his house.  My recommendation was that  he go to hospice house where he would be comfortable and receive excellent care.    The patient remained silent during most of our conversation (which is normal for him).  We agreed to meet once again with patient and son on 2/28 to make decisions.  Assessment: 72 yo male with advanced COPD on home oxygen.  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  He has a large fungating, ulcerated mass that has grown into the top of his stomach.  He is refusing all solid food.  He is unable to stand or walk to due extreme weakness and severe pain in his left ankle.   Patient Profile/HPI:  72 y.o. 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  His BP in the ER was 62/38 and he required urgent RBC transfusion and fluid resuscitation. Work up with EGD revealed a large fungating/ulcerated mass in his G/E junction and cardia.  CT scan show and increase in liver mets and a new left adrenal nodule.  Length of Stay: 2  Current Medications: Scheduled Meds:  . ferrous sulfate  325 mg Oral Daily  . mouth rinse  15 mL Mouth Rinse BID  . pantoprazole (PROTONIX) IV  40 mg Intravenous Q12H  . umeclidinium-vilanterol  1 puff Inhalation Daily    Continuous Infusions:   PRN Meds: albuterol, lidocaine-prilocaine, morphine injection, sodium chloride flush, zolpidem  Physical Exam        Chronically ill appearing male, on oxygen, poor dentition, awake, alert Chest is cachectic.  No w/c/r.  Increased work of breathing at rest. CV difficult to hear but  regular Abdomen swollen, tender to palpation in the right lower quadrant. LLE with swelling.  Patient refuses to allow me to palpate it.  Vital Signs: BP (!) 103/57 (BP Location: Left Arm)   Pulse (!) 102   Temp 98.5 F (36.9 C) (Oral)   Resp (!) 22   Ht 5\' 7"  (1.702 m)   Wt 68.2 kg (150 lb 5.7 oz)   SpO2 94%   BMI 23.55 kg/m  SpO2: SpO2: 94 % O2 Device: O2 Device: Nasal Cannula O2 Flow Rate: O2 Flow Rate (L/min): 2 L/min  Intake/output summary:   Intake/Output Summary (Last 24 hours) at 02/04/2018 1005 Last data filed at 02/04/2018 0445 Gross per 24 hour  Intake 1361.67 ml  Output 300 ml  Net 1061.67 ml   LBM: Last BM Date: 01/15/2018 Baseline Weight: Weight: 60.8 kg (134 lb) Most recent weight: Weight: 68.2 kg (150 lb 5.7 oz)       Palliative Assessment/Data: 20%      Patient Active Problem List   Diagnosis Date Noted  . Upper GI bleed   . Palliative care encounter   . GIB (gastrointestinal bleeding) 01/14/2018  . Abnormal LFTs 01/31/2018  . AKI (acute kidney injury) ([place]) 01/09/2018  . Hypotension 01/27/2018  . Acute blood loss anemia 01/29/2018  . COPD (chronic obstructive pulmonary disease) ([place]) 01/19/2018  . Depression 02/03/2018  . Symptomatic anemia   . Goals of care, counseling/discussion 01/31/2018  . Port-A-Cath in place 01/29/2018  . Esophageal cancer, stage IV ([employer]) 11/05/2017  . Iron deficiency anemia   . Loss of weight   . Other dysphagia   . Respiratory distress   . Essential hypertension   . Acute encephalopathy   . Community acquired pneumonia of right  lower lobe of lung ([place])   . Influenza due to identified novel influenza A virus with other respiratory manifestations   . Acute respiratory failure with hypoxia ([place]) 12/05/2016  . COPD exacerbation ([place])   . Nicotine dependence 09/01/2013  . Acute respiratory distress 08/30/2013  . SOB (shortness of breath) 08/30/2013  . COPD (chronic obstructive pulmonary disease) with acute bronchitis  ([place]) 08/30/2013  . HTN (hypertension) 08/30/2013    Palliative Care Plan    Recommendations/Plan:  Discussed with Dr. Wendee Beavers - appreciate his conversation.  Per Dr. Wendee Beavers will order LLE xray and trial colchicine.  I will order liquid tylenol scheduled, liquid morphine PRN, and very low dose scheduled ativan.  Will add sucralfate to coat stomach.  He is on protonix IV BID.  Add zofran TIC AC to help prevent nausea during meals.  PMT to remeet with family tomorrow afternoon to reassess and assist with GOC  Goals of Care and Additional Recommendations:  Limitations on Scope of Treatment: Full Scope Treatment  Code Status:  Full code (Need to discuss)  Prognosis:   < 2 weeks.  Patient with poor nutritional status refusing solid food, currently bedbound, weakened respiratory status, at high risk for on-going GI bleeding from metastatic ulcerated adenocarcinoma.   Discharge Planning:  To Be Determined would recommend Hospice House.  Patient / Pandora Leiter may desire a trial at [facility].  Son unable to provide 24 hour care in the home even with Hospice support.  Care plan was discussed with Son, [facility] attending.  Thank you for allowing the Palliative Medicine Team to assist in the care of this patient.  Total time spent:  60 min.     Greater than 50%  of this time was spent counseling and coordinating care related to the above assessment and plan.  Florentina Jenny, PA-C Palliative Medicine  Please contact Palliative MedicineTeam phone at [phone] for questions and concerns between 7 am - 7 pm.   Please see AMION for individual provider pager numbers.

## 2018-02-04 NOTE — Progress Notes (Signed)
PROGRESS NOTE    William Villegas  EYC:[id number] DOB: January 04, 1946 DOA: 01/12/2018 PCP: Susy Frizzle, MD   Brief Narrative:  HPI on 01/13/2018 by Dr. Ivor Costa William Villegas is a 72 y.o. male with medical history significant of hypertension, COPD, GERD, depression, stage IV metastasized esophageal cancer, who presents with hematemesis and hematochezia.  Patient states that he has been having dark stool diarrhea in the past 3 days. Last night he had 1 large amount of hematemesis with dark blood. He had mild lower abdominal pain, which has resolved. He has mild dizziness, and shortness of breath, no chest pain. He has mild cough, no fever or chills. He states that sometimes he has mild dysuria. No unilateral weakness. Patient was initially hypotensive with blood pressure 62/38, which improved to 92/50 after 2 L normal saline bolus and 1 unit to urgent blood transfusion.  Interim history Patient admitted for GI bleeding, gastroenterology consulted and appreciated, s/p EGD.  Palliative on board and discussing Residential hospice options with patient. Assessment & Plan   GI bleed/Acute blood loss anemia/Acute on chronic anemia -Patient presented with hematemesis and melena -Patient was seen and had an EGD November 2018 which showed an esophageal mass -Patient does have history of stage IV esophageal cancer -Was on Protonix GTT, will transition to PPI BID -Presented with hemoglobin 5.1, was given blood transfusion -baseline hemoglobin 8, currently 10.2 -Continue to monitor CBC -Gastroenterology consulted and appreciated -EGD showed partially obstructing, malignant esophageal tumor was found at the GE junction and the cardia.  Likely source of recent bleeding.  Gastroenterology recommended full liquid diet along with PPI twice daily. - Plan is for residential hospice.   Ankle injury - Patient has lateral malleolar fracture based on x-ray. Colchicine was used prior to x-ray results -  not a surgical candidate but will consult ortho for further evaluation. - continue morphine for prn discomfort.  Metastatic esophageal carcinoma -patient follows with Dr. Burr Medico, oncology, and states that he is supposed to start a new treatment on Thursday of this week -Dr. Burr Medico, consulted and appreciated -Palliative care consulted and currently discussing Residential hospice options.   Essential hypertension /hypotension -currently not on any medications at home and has hypotension likely secondary to blood loss -Continue to monitor closely  Acute kidney injury -Creatinine 1.80 on admission, currently 1.62 -Suspect secondary to blood loss  -continue to monitor BMP  Abnormal LFTs -Likely due to liver metastasis -continue to monitor  Depression -Remeron currently held  COPD -Appears stable, no wheezing -Continue oxygen -Continue albuterol nebs  DVT Prophylaxis  SCDs  Code Status: Full  Family Communication: None at bedside  Disposition Plan: Admitted, pending oncology consultation, dispo TBD  Consultants Gastroenterology  Procedures  None  Antibiotics   Anti-infectives (From admission, onward)   None      Subjective:   William Villegas Pt has no new complaints, no acute issues overnight.   Objective:   Vitals:   02/03/18 2336 02/04/18 0443 02/04/18 1215 02/04/18 1628  BP: (!) 111/57 (!) 103/57 (!) 121/56 (!) 113/59  Pulse: (!) 107 (!) 102 74   Resp: 20 (!) 22 (!) 28 18  Temp: 97.8 F (36.6 C) 98.5 F (36.9 C)    TempSrc: Oral Oral Oral Oral  SpO2: 96% 94% 92%   Weight:      Height:        Intake/Output Summary (Last 24 hours) at 02/04/2018 1812 Last data filed at 02/04/2018 1757 Gross per 24 hour  Intake 10  ml  Output 600 ml  Net -590 ml   Filed Weights   02/04/2018 0130 01/13/2018 1324 01/09/2018 2135  Weight: 60.8 kg (134 lb) 60.8 kg (134 lb) 68.2 kg (150 lb 5.7 oz)   Exam  General: Pt in nad, alert and awake.   HEENT: NCAT, mucous membranes  moist.   Neck: Supple  Cardiovascular: S1 S2 auscultated, RRR, no murmurs  Respiratory: Clear to auscultation bilaterally with equal chest rise  Abdomen: Soft, nontender, nondistended, + bowel sounds  Extremities: warm dry without cyanosis clubbing. LE edema  Neuro: AAOx3, nonfocal  Psych: appropriate  Data Reviewed: I have personally reviewed following labs and imaging studies  CBC: Recent Labs  Lab 01/29/18 0909 01/29/2018 0142 01/29/2018 1014 01/21/2018 2204 02/03/18 1145 02/04/18 0438  WBC 8.4 8.1 7.5 10.3 8.6 7.0  NEUTROABS 5.9 5.2  --   --   --   --   HGB  --  5.1* 7.1* 10.2* 9.2* 8.2*  HCT 23.1* 17.3* 22.5* 31.2* 29.1* 25.3*  MCV 89.9 96.1 89.3 88.6 90.4 91.3  PLT 226 336 249 216 184 810*   Basic Metabolic Panel: Recent Labs  Lab 01/29/18 0909 01/20/2018 0142 01/11/2018 1014 02/03/18 0419 02/04/18 0438  NA 140 141 139 140 139  K 4.2 5.0 5.5* 4.9 4.6  CL 107 110 113* 115* 113*  CO2 24 14* 16* 17* 19*  GLUCOSE 87 91 87 79 86  BUN 22 68* 72* 70* 59*  CREATININE 0.67* 1.80* 1.62* 1.62* 1.54*  CALCIUM 9.3 8.1* 8.1* 8.3* 8.5*   GFR: Estimated Creatinine Clearance: 39.9 mL/min (A) (by C-G formula based on SCr of 1.54 mg/dL (H)). Liver Function Tests: Recent Labs  Lab 01/29/18 0909 01/26/2018 0142  AST 58* 206*  ALT 26 72*  ALKPHOS 420* 336*  BILITOT 0.3 0.4  PROT 5.4* 4.0*  ALBUMIN 2.3* 1.9*   Recent Labs  Lab 01/16/2018 0142  LIPASE 29   No results for input(s): AMMONIA in the last 168 hours. Coagulation Profile: Recent Labs  Lab 01/26/2018 0142  INR 1.52   Cardiac Enzymes: No results for input(s): CKTOTAL, CKMB, CKMBINDEX, TROPONINI in the last 168 hours. BNP (last 3 results) No results for input(s): PROBNP in the last 8760 hours. HbA1C: No results for input(s): HGBA1C in the last 72 hours. CBG: No results for input(s): GLUCAP in the last 168 hours. Lipid Profile: No results for input(s): CHOL, HDL, LDLCALC, TRIG, CHOLHDL, LDLDIRECT in the last  72 hours. Thyroid Function Tests: No results for input(s): TSH, T4TOTAL, FREET4, T3FREE, THYROIDAB in the last 72 hours. Anemia Panel: No results for input(s): VITAMINB12, FOLATE, FERRITIN, TIBC, IRON, RETICCTPCT in the last 72 hours. Urine analysis:    Component Value Date/Time   COLORURINE YELLOW 01/18/2018 1815   APPEARANCEUR CLOUDY (A) 01/28/2018 1815   LABSPEC 1.016 01/25/2018 1815   PHURINE 5.0 01/18/2018 1815   GLUCOSEU NEGATIVE 01/09/2018 1815   HGBUR SMALL (A) 01/16/2018 1815   BILIRUBINUR NEGATIVE 01/13/2018 1815   KETONESUR NEGATIVE 01/09/2018 1815   PROTEINUR NEGATIVE 02/04/2018 1815   NITRITE NEGATIVE 01/26/2018 1815   LEUKOCYTESUR NEGATIVE 01/22/2018 1815   Sepsis Labs: @LABRCNTIP (procalcitonin:4,lacticidven:4)  )No results found for this or any previous visit (from the past 240 hour(s)).    Radiology Studies: Dg Ankle 2 Views Left  Result Date: 02/04/2018 CLINICAL DATA:  Fall on left ankle 3 days ago.  Pain.  Swelling. EXAM: LEFT ANKLE - 2 VIEW COMPARISON:  None. FINDINGS: A fracture of the distal fibula is displaced  laterally and anteriorly. Additional bone fragments are present at the medial malleolus without a definite donor site. IMPRESSION: 1. Lateral malleolus fracture with anterolateral displacement. 2. Bone fragments at the medial malleolus likely reflecting medial malleolar fracture as well. Electronically Signed   By: San Morelle M.D.   On: 02/04/2018 15:35     Scheduled Meds: . acetaminophen (TYLENOL) oral liquid 160 mg/5 mL  650 mg Oral Q6H  . colchicine  0.6 mg Oral Once  . feeding supplement (ENSURE ENLIVE)  237 mL Oral TID BM  . LORazepam  0.26 mg Oral BID  . mouth rinse  15 mL Mouth Rinse BID  . morphine CONCENTRATE  10 mg Oral BID  . ondansetron (ZOFRAN) IV  4 mg Intravenous TID AC  . pantoprazole (PROTONIX) IV  40 mg Intravenous Q12H  . sucralfate  1 g Oral TID WC & HS  . umeclidinium-vilanterol  1 puff Inhalation Daily    Continuous Infusions:    LOS: 2 days   Time Spent in minutes   20 minutes  Velvet Bathe D.O. on 02/04/2018 at 6:12 PM  Between 7am to 7pm - Pager - [phone]  After 7pm go to [email or web address] - password [email or web address]  And look for the night coverage person covering for me after hours  [facility] Office  [phone]

## 2018-02-04 NOTE — Progress Notes (Addendum)
I spoke again with Mr. Telford.   After reflection he has decided to go to [facility].  He would like to go to [facility]) because it is closest to home.  Son would actually prefer that he be in [facility].  Pt.  would also like to move forward with any measures that are going to help his leg feel better.  We briefly discussed code status.  He is agreeable to DNR.  Florentina Jenny, PA-C Palliative Medicine Pager: [phone]  No charge note.

## 2018-02-04 NOTE — Progress Notes (Signed)
Orthopedic Tech Progress Note Patient Details:  William Villegas May 04, 1946 [id number]  Ortho Devices Type of Ortho Device: CAM walker Ortho Device/Splint Location: LLE Ortho Device/Splint Interventions: Ordered, Application   Post Interventions Patient Tolerated: Well Instructions Provided: Care of device   Braulio Bosch 02/04/2018, 7:30 PM

## 2018-02-05 ENCOUNTER — Encounter (HOSPITAL_COMMUNITY): Payer: Self-pay | Admitting: Internal Medicine

## 2018-02-05 ENCOUNTER — Ambulatory Visit: Payer: Medicare PPO

## 2018-02-05 ENCOUNTER — Other Ambulatory Visit: Payer: Medicare PPO

## 2018-02-05 DIAGNOSIS — Z515 Encounter for palliative care: Secondary | ICD-10-CM

## 2018-02-05 DIAGNOSIS — K117 Disturbances of salivary secretion: Secondary | ICD-10-CM

## 2018-02-05 DIAGNOSIS — R0603 Acute respiratory distress: Secondary | ICD-10-CM

## 2018-02-05 MED ORDER — MORPHINE SULFATE (PF) 2 MG/ML IV SOLN
1.0000 mg | INTRAVENOUS | Status: DC
  Administered 2018-02-05: 1 mg via INTRAVENOUS
  Filled 2018-02-05: qty 1

## 2018-02-05 MED ORDER — MORPHINE SULFATE (PF) 2 MG/ML IV SOLN
1.0000 mg | INTRAVENOUS | Status: DC | PRN
  Administered 2018-02-05 (×2): 2 mg via INTRAVENOUS
  Filled 2018-02-05 (×2): qty 1

## 2018-02-05 MED ORDER — GLYCOPYRROLATE 0.2 MG/ML IJ SOLN
0.2000 mg | INTRAMUSCULAR | Status: DC | PRN
  Administered 2018-02-05: 0.2 mg via INTRAVENOUS
  Filled 2018-02-05: qty 1

## 2018-02-05 MED ORDER — SCOPOLAMINE 1 MG/3DAYS TD PT72
1.0000 | MEDICATED_PATCH | TRANSDERMAL | Status: DC
  Administered 2018-02-05: 1.5 mg via TRANSDERMAL
  Filled 2018-02-05: qty 1

## 2018-02-05 MED ORDER — LORAZEPAM 2 MG/ML IJ SOLN: 0.5000 mg | INTRAMUSCULAR | Status: DC | PRN

## 2018-02-06 NOTE — Progress Notes (Signed)
[facility] Liaison: RN note  Received request from Gertha Calkin, [place] for family interest in [facility] Charge reviewed and spoke with son, Larkin Ina and family to acknowledge referral. Unfortunately [facility] is unable to offer a room today. Family and CSW are aware [facility] liaison will follow up with CSW and family tomorrow or sooner if room becomes available. Please do not hesitate to call with questions.  Thank you,  Farrel Gordon, RN, [facility] Liaison [phone]  [facility] hospital liaisons are on [place]

## 2018-02-06 NOTE — Progress Notes (Signed)
   02/25/18 1900  Clinical Encounter Type  Visited With Patient and family together  Visit Type Death  Referral From Nurse  Consult/Referral To Chaplain;Nurse  Spiritual Encounters  Spiritual Needs Prayer;Grief support  Stress Factors  Patient Stress Factors None identified  Family Stress Factors None identified   Responded to page.  Patient passed away a hour ago.  I went to room and pray for family.   Chaplain Intern [place]

## 2018-02-06 NOTE — Progress Notes (Signed)
Clinical Social Worker following family for support and discharge needs. CSW was consulted to assist patient in being placed at a residential hospice facility. Per palliative note patient has decided to go to a hospice in [facility]. CSW reached out to the hospice in [facility] and spoke to Baird. Meagan stated at this time facility is full and does not have any beds available.  CSW met patient and son Larkin Ina) at bedside. Patient was asleep and CSW relayed message from [facility] stated that he had a discussion with patient and they had decided to go with [facility] in [place].  CSW reached out to Bevely Palmer from [place] to make a referral. Bevely Palmer stated at this time [facility] is full but stated they will reach out to patient and son and discuss process with family.   Rhea Pink, MSW,  [place]

## 2018-02-06 NOTE — Progress Notes (Signed)
Responded to page to support patient and family.  Per family request prayed with all and provided emotional, grief and spiritual support.  Patient actively dying. Will follow as needed.    2018-02-10 1439  Clinical Encounter Type  Visited With Patient and family together;Health care provider  Visit Type Initial;Spiritual support  Referral From Nurse  Spiritual Encounters  Spiritual Needs Prayer;Emotional;Grief support  Stress Factors  Family Stress Factors Exhausted;Loss  Cristopher Peru, [facility], Pager [phone]

## 2018-02-06 NOTE — Progress Notes (Signed)
Daily Progress Note   Patient Name: MITHRAN STRIKE       Date: Mar 02, 2018 DOB: 1946-02-08  Age: 72 y.o. MRN#: [id number] Attending Physician: Velvet Bathe, MD Primary Care Physician: Susy Frizzle, MD Admit Date: 01/29/2018  Reason for Consultation/Follow-up: Establishing goals of care, Non pain symptom management, Pain control and Psychosocial/spiritual support  Subjective: Patient is in bed, lethargic this morning with agonal respirations along with periods of apnea. He appears to be uncomfortable with periods of gasping for breaths.  When name called he will open eyes and immediately close them back. No verbal response. Family at bedside (son Larkin Ina and 2 daughters). Son reports noticing a change in his father since last night, and him being more lethargic and unresponsive. He reports observing him moaning and grimacing earlier as well.   We discussed his current state, and the likely hood that Mr. Seats could be in the process of transitioning. The family is aware that there is a possibility that their father may pass away in the hospital prior to securing a hospice bed at one of the local residential facilities. Family is in agreement and is at the bedside providing love and support. Requesting at this time to proceed with full comfort. Educated on comfort focused care and medications for symptom management/relief of suffering.  Therapeutic listening as family shares stories of Mr. Capri. Emotional support provided.   Length of Stay: 3  Current Medications: Scheduled Meds:  . acetaminophen (TYLENOL) oral liquid 160 mg/5 mL  650 mg Oral Q6H  . mouth rinse  15 mL Mouth Rinse BID  . morphine CONCENTRATE  10 mg Oral BID  . ondansetron (ZOFRAN) IV  4 mg Intravenous TID AC  .  pantoprazole (PROTONIX) IV  40 mg Intravenous Q12H  . scopolamine  1 patch Transdermal Q72H  . sucralfate  1 g Oral TID WC & HS  . umeclidinium-vilanterol  1 puff Inhalation Daily    Continuous Infusions:   PRN Meds: albuterol, glycopyrrolate, lidocaine-prilocaine, LORazepam, morphine injection, sodium chloride flush, zolpidem  Physical Exam  Constitutional: He appears lethargic. He appears cachectic. He appears ill.  Cardiovascular: An irregular rhythm present.  Pulmonary/Chest: Accessory muscle usage present.  Congested rattle, with periods of apnea. 2L/[place]  Neurological: He appears lethargic.  Vital Signs: BP 115/62 (BP Location: Left Arm)   Pulse 97   Temp 97.6 F (36.4 C) (Oral)   Resp 18   Ht 5\' 7"  (1.702 m)   Wt 68.2 kg (150 lb 5.7 oz)   SpO2 96%   BMI 23.55 kg/m  SpO2: SpO2: 96 % O2 Device: O2 Device: Nasal Cannula O2 Flow Rate: O2 Flow Rate (L/min): 2 L/min  Intake/output summary:   Intake/Output Summary (Last 24 hours) at February 07, 2018 1234 Last data filed at February 07, 2018 0416 Gross per 24 hour  Intake 240 ml  Output 575 ml  Net -335 ml   LBM: Last BM Date: 02/04/2018 Baseline Weight: Weight: 60.8 kg (134 lb) Most recent weight: Weight: 68.2 kg (150 lb 5.7 oz)       Palliative Assessment/Data:PPS 20%   Patient Active Problem List   Diagnosis Date Noted  . Ankle pain, left   . Upper GI bleed   . Palliative care encounter   . GIB (gastrointestinal bleeding) 01/10/2018  . Abnormal LFTs 01/09/2018  . AKI (acute kidney injury) ([place]) 02/03/2018  . Hypotension 01/22/2018  . Acute blood loss anemia 01/27/2018  . COPD (chronic obstructive pulmonary disease) ([place]) 01/09/2018  . Depression 01/26/2018  . Symptomatic anemia   . Goals of care, counseling/discussion 01/31/2018  . Port-A-Cath in place 01/29/2018  . Esophageal cancer, stage IV ([place]) 11/05/2017  . Iron deficiency anemia   . Loss of weight   . Other dysphagia   . Respiratory distress   .  Essential hypertension   . Acute encephalopathy   . Community acquired pneumonia of right lower lobe of lung ([place])   . Influenza due to identified novel influenza A virus with other respiratory manifestations   . Acute respiratory failure with hypoxia ([place]) 12/05/2016  . COPD exacerbation ([place])   . Nicotine dependence 09/01/2013  . Acute respiratory distress 08/30/2013  . SOB (shortness of breath) 08/30/2013  . COPD (chronic obstructive pulmonary disease) with acute bronchitis ([place]) 08/30/2013  . 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 His BP in the ER was 62/38 and he required urgent RBC transfusion and fluid resuscitation.Work up with EGD revealed a large fungating/ulcerated mass in his G/E junction and cardia. CT scan show and increase in liver mets and a new left adrenal nodule.  Assessment: GI Bleed  Ankle Injury  Metastatic esophageal carcinoma Essential hypertension AKI Abnormal LFTs Depression COPD  Recommendations/Plan:  Per Dr. Wendee Beavers LLE xray completed 2/27-Lateral malleolus fracture with anterolateral displacement. Cam walker was applied via Ortho Tech  Transition to comfort measures. Patient actively dying. Prepared family for prognosis of hours-days. May not be stable for transfer to hospice facility.  Will d/c scheduled ativan, make available PRN  Robinul IV PRN for secretions/Scopolamine patch ordered for secretions  Scheduled Morphine 1mg  IV q4h.   Morphine 1-2mg  IV q1h prn pain/dyspnea/air hunger. May need to consider continuous morphine infusion if patient is requiring frequent prns.   Chaplain consult for family support  PMT to continue to provide support to family during hospitalization  Goals of Care and Additional  Recommendations:  Limitations on Scope of Treatment: Full Comfort Care  Code Status: DNR/DNI   Code Status Orders  (From admission, onward)        Start     Ordered   02/04/18 1257  Do  not attempt resuscitation (DNR)  Continuous    Question Answer Comment  In the event of cardiac or respiratory ARREST Do not call a "code blue"   In the event of cardiac or respiratory ARREST Do not perform Intubation, CPR, defibrillation or ACLS   In the event of cardiac or respiratory ARREST Use medication by any route, position, wound care, and other measures to relive pain and suffering. May use oxygen, suction and manual treatment of airway obstruction as needed for comfort.      02/04/18 1256    Code Status History    Date Active Date Inactive Code Status Order ID Comments User Context   01/29/2018 03:56 02/04/2018 12:56 Full Code [id number]  Ivor Costa, MD ED   12/05/2016 22:03 12/11/2016 20:12 Full Code [id number]  Germain Osgood, PA-C ED   08/30/2013 17:45 09/01/2013 17:39 Full Code [id number]  Geradine Girt, DO Inpatient    Advance Directive Documentation     Most Recent Value  Type of Advance Directive  Healthcare Power of Attorney  Pre-existing out of facility DNR order (yellow form or pink MOST form)  No data  "MOST" Form in Place?  No data       Prognosis:   Hours - Days  Discharge Planning:  Hospice facility vs possible hospital death   Care plan was discussed with family and bedside RN.   Thank you for allowing the Palliative Medicine Team to assist in the care of this patient.   Total Time 1030-1130, 1440-1455 Prolonged Time Billed Yes- 35min      Greater than 50%  of this time was spent counseling and coordinating care related to the above assessment and plan.  Alda Lea, AGNP-C Ihor Dow, FNP-C Palliative Medicine Team  Phone: [phone] Fax: [phone]   Please contact Palliative Medicine Team phone at [phone] for questions and concerns.

## 2018-02-06 NOTE — Progress Notes (Signed)
PROGRESS NOTE    William Villegas  MCN:[id number] DOB: 04/03/1946 DOA: 01/10/2018 PCP: Susy Frizzle, MD   Brief Narrative:  HPI on 01/29/2018 by Dr. Ivor Costa William Villegas is a 72 y.o. male with medical history significant of hypertension, COPD, GERD, depression, stage IV metastasized esophageal cancer, who presents with hematemesis and hematochezia.  Patient states that he has been having dark stool diarrhea in the past 3 days. Last night he had 1 large amount of hematemesis with dark blood. He had mild lower abdominal pain, which has resolved. He has mild dizziness, and shortness of breath, no chest pain. He has mild cough, no fever or chills. He states that sometimes he has mild dysuria. No unilateral weakness. Patient was initially hypotensive with blood pressure 62/38, which improved to 92/50 after 2 L normal saline bolus and 1 unit to urgent blood transfusion.  Interim history Patient admitted for GI bleeding, gastroenterology consulted and appreciated, s/p EGD.  Palliative on board and discussing Residential hospice options with patient. Assessment & Plan   GI bleed/Acute blood loss anemia/Acute on chronic anemia -Patient presented with hematemesis and melena -Patient was seen and had an EGD November 2018 which showed an esophageal mass -Patient does have history of stage IV esophageal cancer -Was on Protonix GTT, will transition to PPI BID -Presented with hemoglobin 5.1, was given blood transfusion -baseline hemoglobin 8, currently 10.2 -Continue to monitor CBC -Gastroenterology consulted and appreciated -EGD showed partially obstructing, malignant esophageal tumor was found at the GE junction and the cardia.  Likely source of recent bleeding.  Gastroenterology recommended full liquid diet along with PPI twice daily. - Currently awaiting bed availability at Residential hospice.   Ankle injury - Patient has lateral malleolar fracture based on x-ray.  - not a surgical  candidate discussed with ortho who recommended the boot I have ordered (see orders for details) - continue morphine for prn discomfort.  Metastatic esophageal carcinoma -patient follows with Dr. Burr Medico, oncology, and states that he is supposed to start a new treatment on Thursday of this week -Dr. Burr Medico, consulted and appreciated -Palliative care consulted and currently discussing Residential hospice options.   Essential hypertension /hypotension - currently not on any medications at home and has hypotension likely secondary to blood loss  Acute kidney injury -Creatinine 1.80 on admission, currently 1.54  Abnormal LFTs - Likely due to liver metastasis - pt to go to comfort care  Depression -Remeron currently held  COPD -Appears stable, no wheezing -Continue oxygen -Continue albuterol nebs  DVT Prophylaxis  SCDs  Code Status: Full  Family Communication: Discussed with son at bedside  Disposition Plan: Admitted, pending oncology consultation, dispo TBD  Consultants Gastroenterology  Procedures  None  Antibiotics   Anti-infectives (From admission, onward)   None      Subjective:   William Villegas Pt has no new complaints today. Discussed with son at bedside and plan is for residential hospice placement.  Objective:   Vitals:   02/04/18 2050 2018/02/17 0006 Feb 17, 2018 0415 02-17-18 0845  BP: 107/62 114/60 115/62   Pulse: 92 98 97 97  Resp: 19 19 17 18   Temp: (!) 97.5 F (36.4 C) (!) 97.5 F (36.4 C) 97.6 F (36.4 C)   TempSrc: Oral Oral Oral   SpO2: 98% 96% 97% 96%  Weight:      Height:        Intake/Output Summary (Last 24 hours) at 17-Feb-2018 1613 Last data filed at 02-17-18 0416 Gross per 24 hour  Intake 240 ml  Output 575 ml  Net -335 ml   Filed Weights   01/22/2018 0130 02/01/2018 1324 01/31/2018 2135  Weight: 60.8 kg (134 lb) 60.8 kg (134 lb) 68.2 kg (150 lb 5.7 oz)   Exam exam unchanged when compared to yesterday 02/04/2018  General: Pt in nad,  alert and awake.   HEENT: NCAT, mucous membranes moist.   Neck: Supple  Cardiovascular: S1 S2 auscultated, RRR, no murmurs  Respiratory: Clear to auscultation bilaterally with equal chest rise  Abdomen: Soft, nontender, nondistended, + bowel sounds  Extremities: warm dry without cyanosis clubbing. LE edema  Neuro: AAOx3, nonfocal  Psych: appropriate  Data Reviewed: I have personally reviewed following labs and imaging studies  CBC: Recent Labs  Lab 01/13/2018 0142 02/01/2018 1014 01/18/2018 2204 02/03/18 1145 02/04/18 0438  WBC 8.1 7.5 10.3 8.6 7.0  NEUTROABS 5.2  --   --   --   --   HGB 5.1* 7.1* 10.2* 9.2* 8.2*  HCT 17.3* 22.5* 31.2* 29.1* 25.3*  MCV 96.1 89.3 88.6 90.4 91.3  PLT 336 249 216 184 657*   Basic Metabolic Panel: Recent Labs  Lab 02/04/2018 0142 01/23/2018 1014 02/03/18 0419 02/04/18 0438  NA 141 139 140 139  K 5.0 5.5* 4.9 4.6  CL 110 113* 115* 113*  CO2 14* 16* 17* 19*  GLUCOSE 91 87 79 86  BUN 68* 72* 70* 59*  CREATININE 1.80* 1.62* 1.62* 1.54*  CALCIUM 8.1* 8.1* 8.3* 8.5*   GFR: Estimated Creatinine Clearance: 39.9 mL/min (A) (by C-G formula based on SCr of 1.54 mg/dL (H)). Liver Function Tests: Recent Labs  Lab 01/10/2018 0142  AST 206*  ALT 72*  ALKPHOS 336*  BILITOT 0.4  PROT 4.0*  ALBUMIN 1.9*   Recent Labs  Lab 01/27/2018 0142  LIPASE 29   No results for input(s): AMMONIA in the last 168 hours. Coagulation Profile: Recent Labs  Lab 02/04/2018 0142  INR 1.52   Cardiac Enzymes: No results for input(s): CKTOTAL, CKMB, CKMBINDEX, TROPONINI in the last 168 hours. BNP (last 3 results) No results for input(s): PROBNP in the last 8760 hours. HbA1C: No results for input(s): HGBA1C in the last 72 hours. CBG: No results for input(s): GLUCAP in the last 168 hours. Lipid Profile: No results for input(s): CHOL, HDL, LDLCALC, TRIG, CHOLHDL, LDLDIRECT in the last 72 hours. Thyroid Function Tests: No results for input(s): TSH, T4TOTAL,  FREET4, T3FREE, THYROIDAB in the last 72 hours. Anemia Panel: No results for input(s): VITAMINB12, FOLATE, FERRITIN, TIBC, IRON, RETICCTPCT in the last 72 hours. Urine analysis:    Component Value Date/Time   COLORURINE YELLOW 01/12/2018 1815   APPEARANCEUR CLOUDY (A) 01/23/2018 1815   LABSPEC 1.016 01/24/2018 1815   PHURINE 5.0 01/31/2018 1815   GLUCOSEU NEGATIVE 01/21/2018 1815   HGBUR SMALL (A) 01/18/2018 1815   BILIRUBINUR NEGATIVE 01/23/2018 1815   KETONESUR NEGATIVE 01/11/2018 1815   PROTEINUR NEGATIVE 01/11/2018 1815   NITRITE NEGATIVE 01/24/2018 1815   LEUKOCYTESUR NEGATIVE 01/24/2018 1815   Sepsis Labs: @LABRCNTIP (procalcitonin:4,lacticidven:4)  )No results found for this or any previous visit (from the past 240 hour(s)).    Radiology Studies: Dg Ankle 2 Views Left  Result Date: 02/04/2018 CLINICAL DATA:  Fall on left ankle 3 days ago.  Pain.  Swelling. EXAM: LEFT ANKLE - 2 VIEW COMPARISON:  None. FINDINGS: A fracture of the distal fibula is displaced laterally and anteriorly. Additional bone fragments are present at the medial malleolus without a definite donor site. IMPRESSION: 1.  Lateral malleolus fracture with anterolateral displacement. 2. Bone fragments at the medial malleolus likely reflecting medial malleolar fracture as well. Electronically Signed   By: San Morelle M.D.   On: 02/04/2018 15:35     Scheduled Meds: . acetaminophen (TYLENOL) oral liquid 160 mg/5 mL  650 mg Oral Q6H  . mouth rinse  15 mL Mouth Rinse BID  .  morphine injection  1 mg Intravenous Q4H  . ondansetron (ZOFRAN) IV  4 mg Intravenous TID AC  . pantoprazole (PROTONIX) IV  40 mg Intravenous Q12H  . scopolamine  1 patch Transdermal Q72H  . umeclidinium-vilanterol  1 puff Inhalation Daily   Continuous Infusions:    LOS: 3 days   Time Spent in minutes   20 minutes  Velvet Bathe D.O. on 02-24-18 at 4:13 PM  Between 7am to 7pm - Pager - [phone]  After 7pm go to  [email or web address] - password [email or web address]  And look for the night coverage person covering for me after hours  [facility] Office  [phone]

## 2018-02-06 NOTE — Progress Notes (Signed)
Patient taken to [facility]

## 2018-02-06 DEATH — deceased

## 2018-02-12 ENCOUNTER — Ambulatory Visit: Payer: Medicare PPO

## 2018-02-12 ENCOUNTER — Other Ambulatory Visit: Payer: Medicare PPO

## 2018-02-12 ENCOUNTER — Ambulatory Visit: Payer: Medicare PPO | Admitting: Hematology

## 2018-02-19 ENCOUNTER — Other Ambulatory Visit: Payer: Self-pay | Admitting: Nurse Practitioner

## 2018-02-26 ENCOUNTER — Ambulatory Visit: Payer: Medicare PPO

## 2018-02-26 ENCOUNTER — Ambulatory Visit: Payer: Medicare PPO | Admitting: Nurse Practitioner

## 2018-02-26 ENCOUNTER — Other Ambulatory Visit: Payer: Medicare PPO

## 2018-03-09 NOTE — Discharge Summary (Signed)
Death Summary  William Villegas:[id number] DOB: [birth year] DOA: 2018-02-25  PCP: Susy Frizzle, MD  Admit date: 02/25/2018 Date of Death: 03/05/18  Final Diagnoses:  Principal Problem:   GIB (gastrointestinal bleeding) Active Problems:   Essential hypertension   Esophageal cancer, stage IV ([facility])   Abnormal LFTs   AKI (acute kidney injury) ([place])   Hypotension   Acute blood loss anemia   COPD (chronic obstructive pulmonary disease) ([facility])   Depression   Symptomatic anemia   Upper GI bleed   Palliative care encounter   Ankle pain, left   Terminal care   Increased oropharyngeal secretions  History of present illness:  William Villegas a 71 y.o.malewith medical history significant ofhypertension, COPD, GERD, depression, stage IV metastasized esophageal cancer, who presents with hematemesis and hematochezia.  Pt found to have advanced malignant esophageal tumor  Hospital Course:  Pt died of complications related to his advanced cancer. Of note he had malignant esophageal tumor that was prone to bleeding.   Signed:  Velvet Bathe  [facility] March 05, 2018, 5:48 PM

## 2018-12-04 IMAGING — CR DG CHEST 1V PORT
2 series · 2 of 2 positions shown · non-contrast
Comparison: 10/10/1959 chest radiograph.

CLINICAL DATA: Intubated

EXAM:
PORTABLE CHEST 1 VIEW

[AP (1 of 2)]
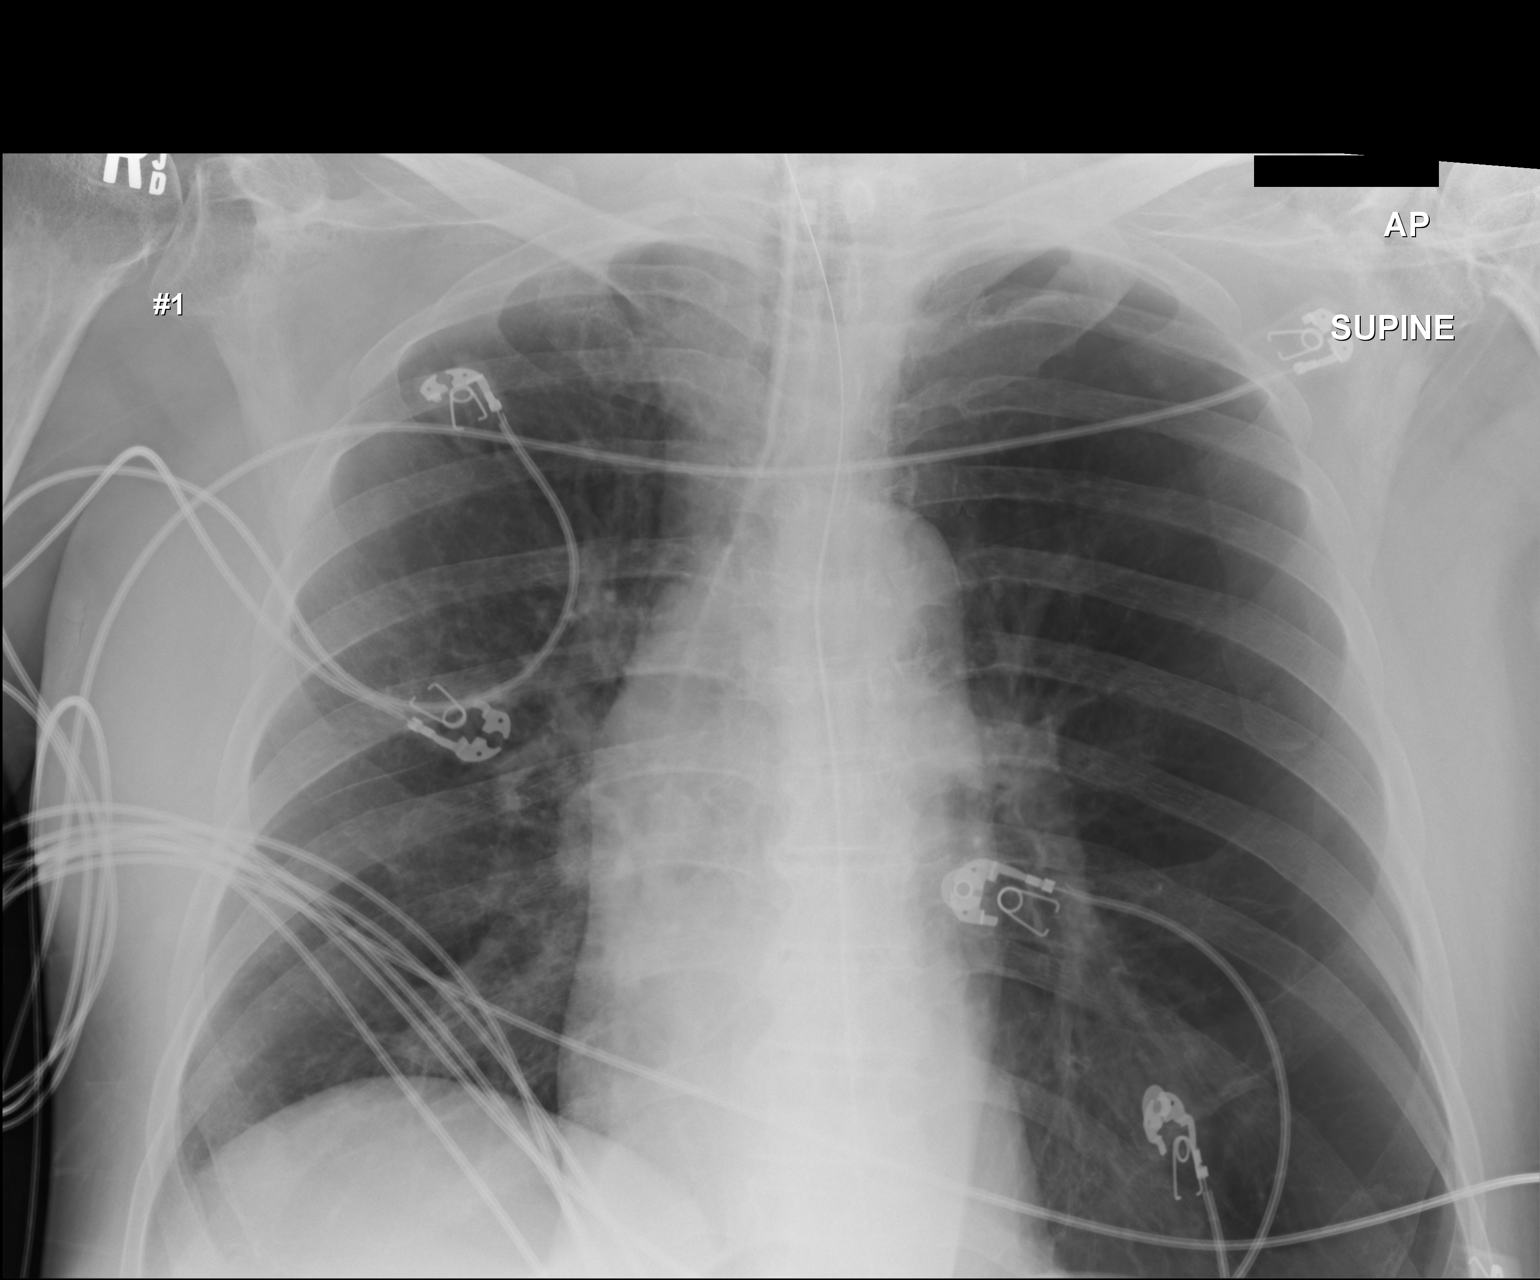

[AP (2 of 2)]
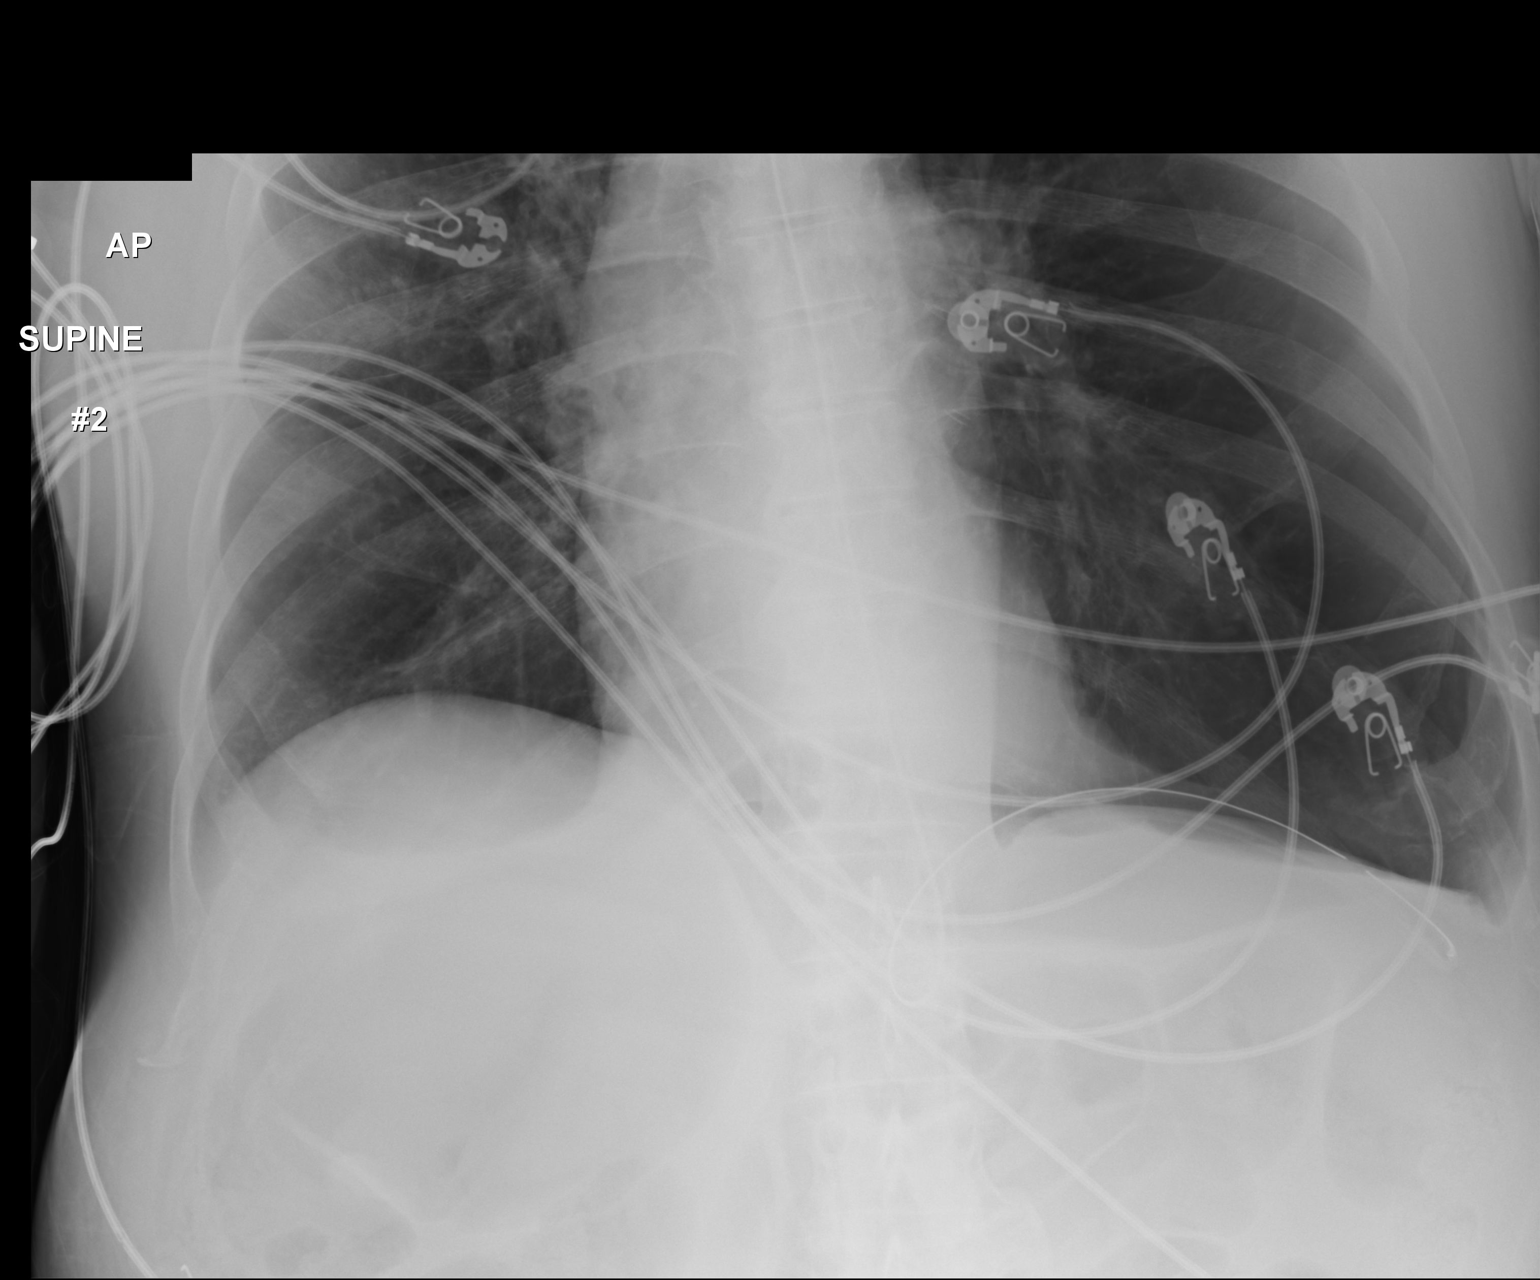

[2 of 2 positions shown; findings below may reference images not displayed]

FINDINGS: Endotracheal tube tip is 4.1 cm above the carina. Enteric tube
terminates in the gastric fundus. Stable cardiomediastinal
silhouette with normal heart size. No pneumothorax. Minimal blunting
of the left costophrenic angle. No right pleural effusion. No
pulmonary edema. Emphysema. No acute consolidative airspace disease.
IMPRESSION: 1. Well-positioned endotracheal and enteric tubes.
2. Emphysema.  No acute pulmonary disease.
3. Minimal blunting of the left costophrenic angle, favor chronic
mild pleural-parenchymal scarring. No significant pleural effusions.

## 2018-12-05 IMAGING — CR DG CHEST 1V PORT
1 series · 1 of 1 positions shown · non-contrast
Comparison: 12/05/2016

CLINICAL DATA: ET tube, respiratory failure

EXAM:
PORTABLE CHEST 1 VIEW

[AP]
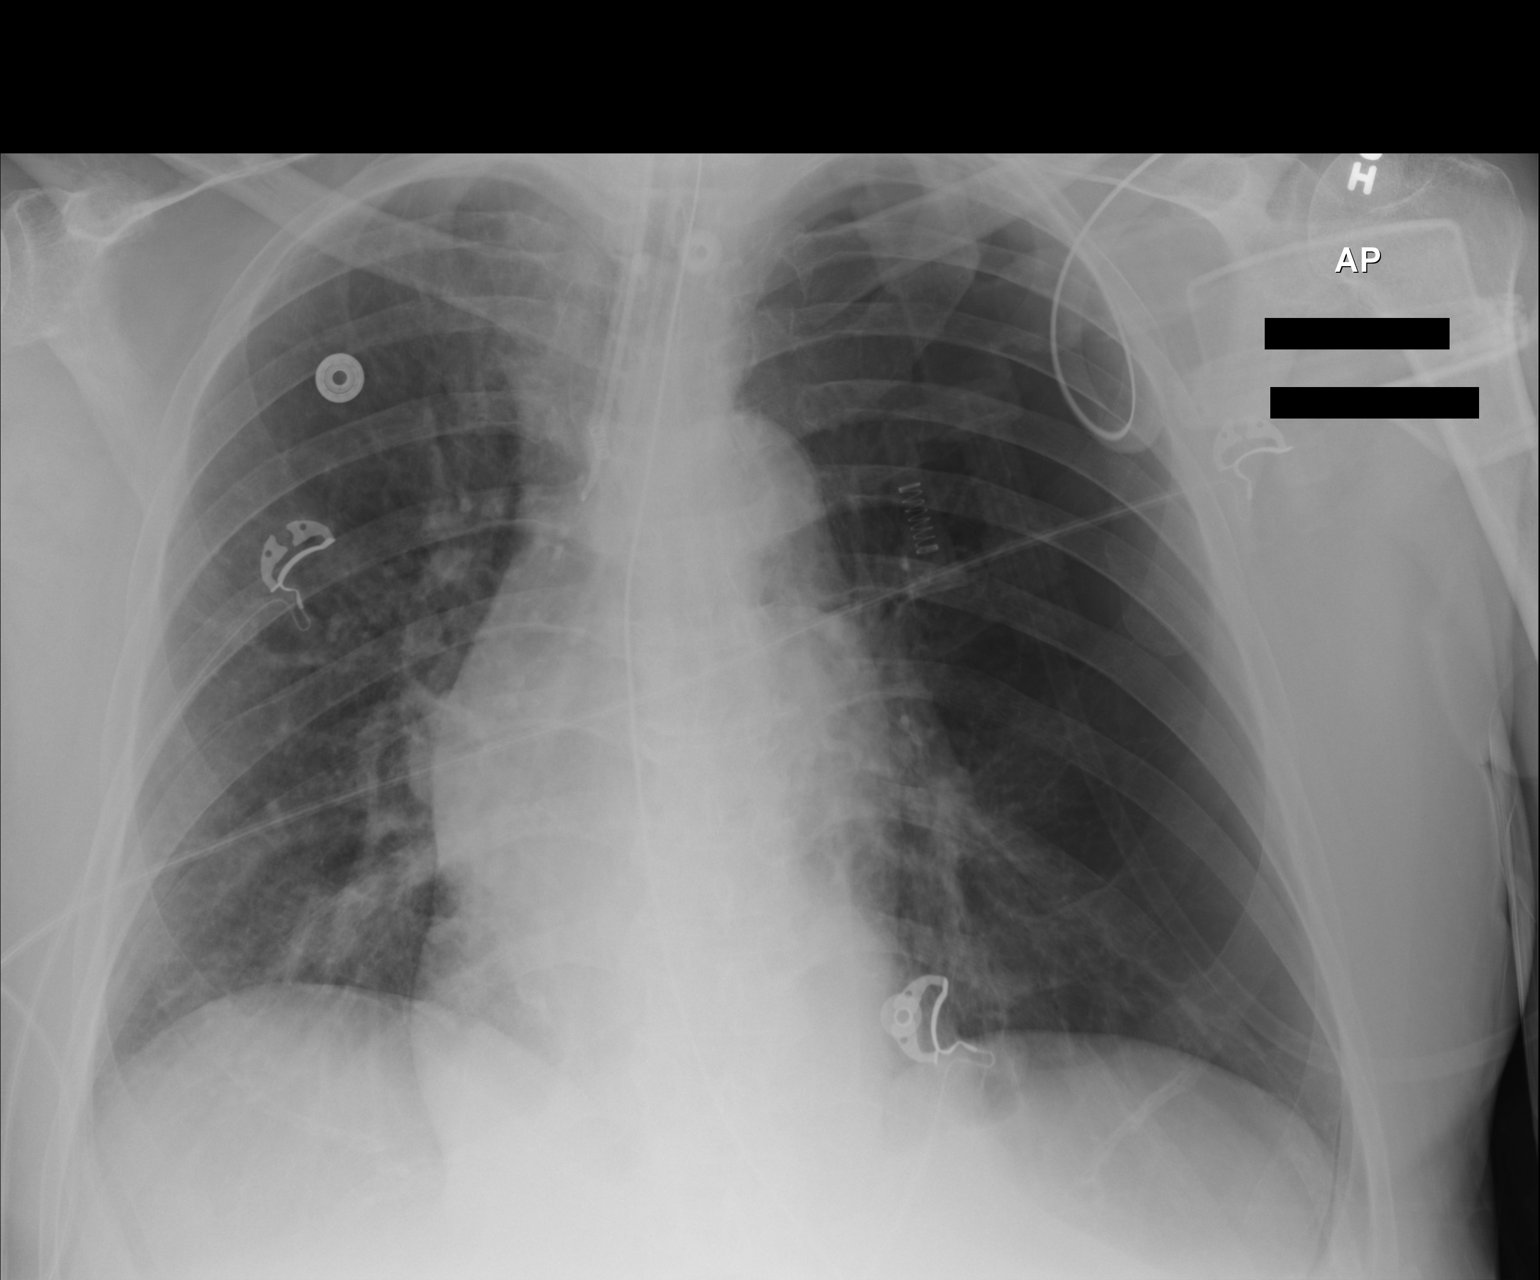

[1 of 1 positions shown; findings below may reference images not displayed]

FINDINGS: Emphysematous changes in the lungs. Endotracheal tube and NG tube
are unchanged. No confluent airspace opacities or effusions. Heart
is normal size.
IMPRESSION: Emphysema.

Stable support devices.

No change.

## 2018-12-06 IMAGING — CR DG CHEST 1V PORT
1 series · 1 of 1 positions shown · non-contrast
Comparison: 12/06/2016

CLINICAL DATA: Acute respiratory failure with hypoxia

EXAM:
PORTABLE CHEST 1 VIEW

[AP]
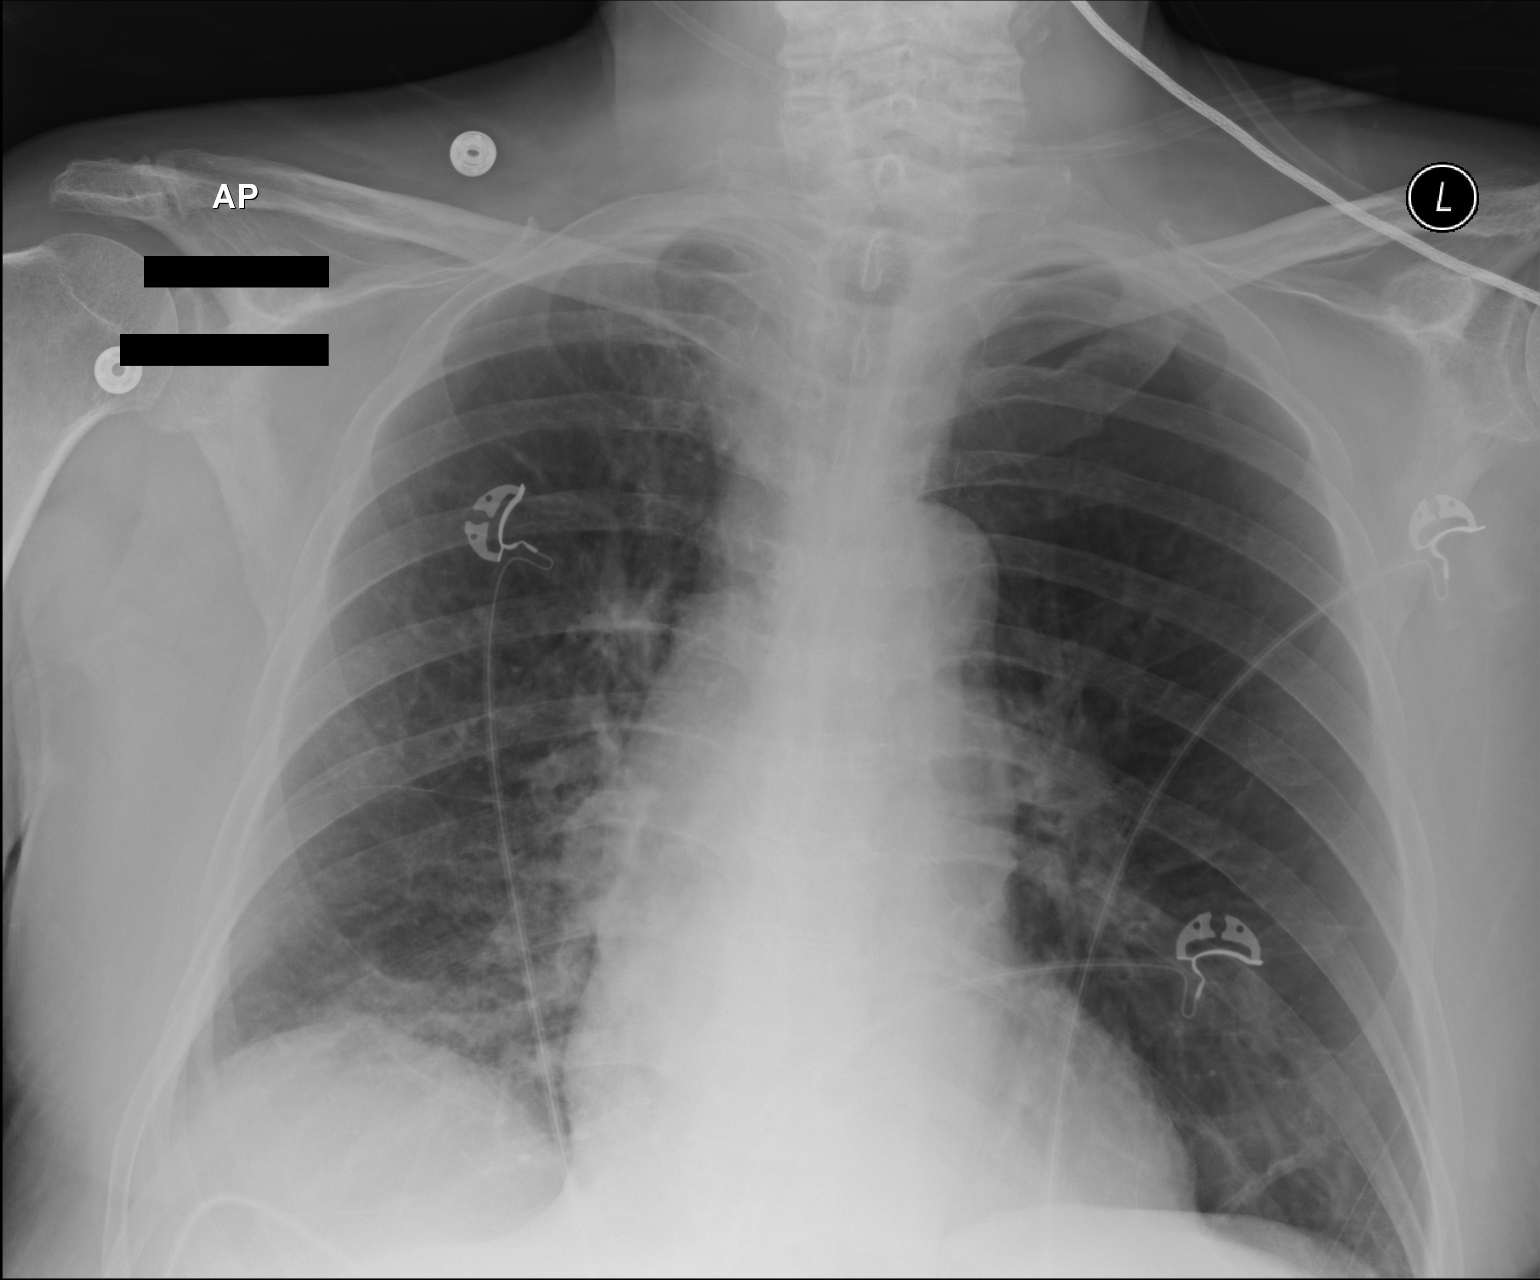

[1 of 1 positions shown; findings below may reference images not displayed]

FINDINGS: Interval extubation and removal of NG tube. Increasing right lower
lobe atelectasis or infiltrate. No confluent opacity on the left.
Heart is normal size. No effusions.
IMPRESSION: Increasing right base atelectasis or infiltrate.

## 2018-12-08 IMAGING — CT CT ANGIO CHEST
2 of 8 series · 17 of 46 positions shown · IV contrast (OMNI)
Comparison: Chest radiograph 12/07/2016

CLINICAL DATA: Pt admitted with the flu on [REDACTED]; hx of COPD; pt
denies worsened SOB; pt c/o intermittent LEFT lower chest pain.

EXAM:
CT ANGIOGRAPHY CHEST WITH CONTRAST
TECHNIQUE: Multidetector CT imaging of the chest was performed using the
standard protocol during bolus administration of intravenous
contrast. Multiplanar CT image reconstructions and MIPs were
obtained to evaluate the vascular anatomy.
CONTRAST:  100 mL of Isovue 370 intravenous contrast

[Series 5: thins · axial · 0.76mm/px · z∈[+963,+1277]mm · 14 of 346 slices shown]
[im 16/346  lung]
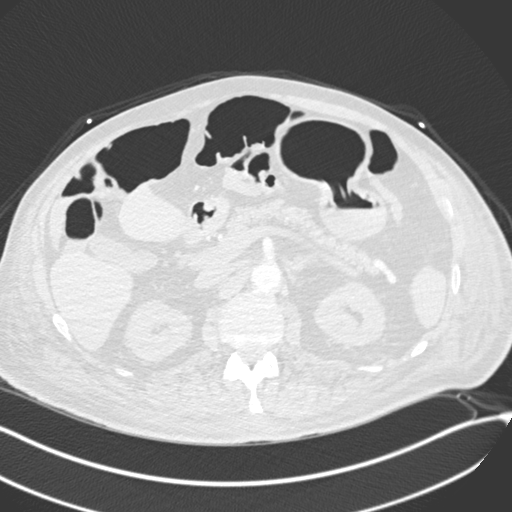
[im 48/346  soft-tissue]
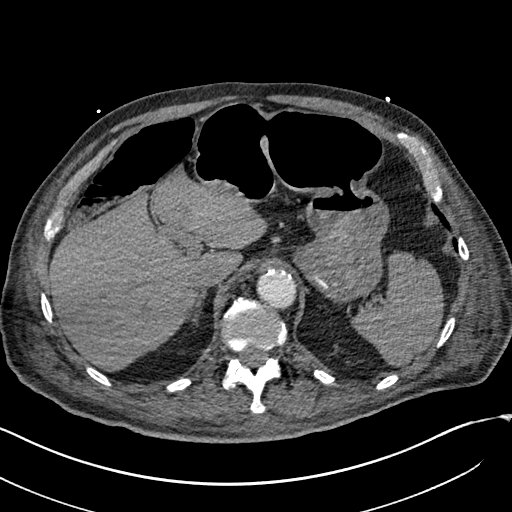
[im 63/346  lung]
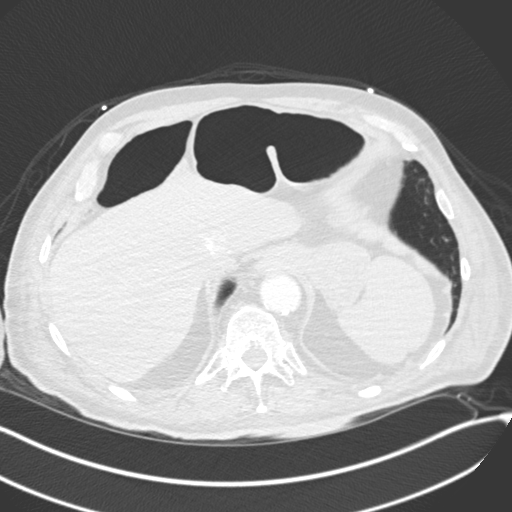
[im 95/346  soft-tissue]
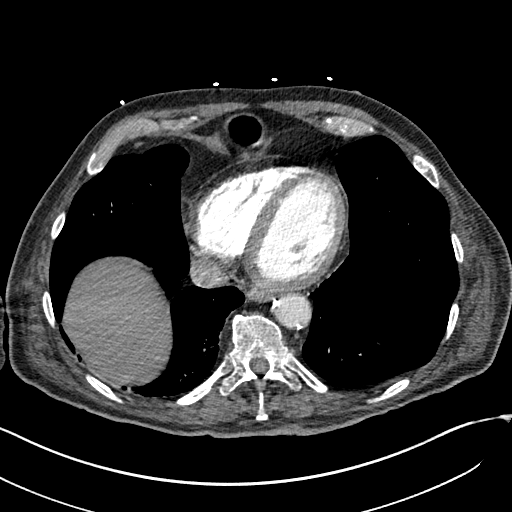
[im 110/346  lung]
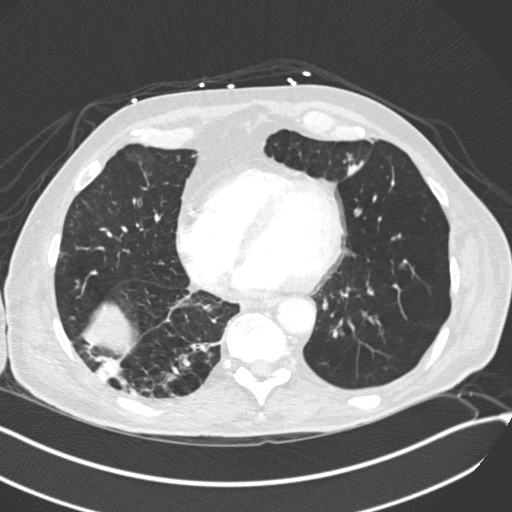
[im 142/346  soft-tissue]
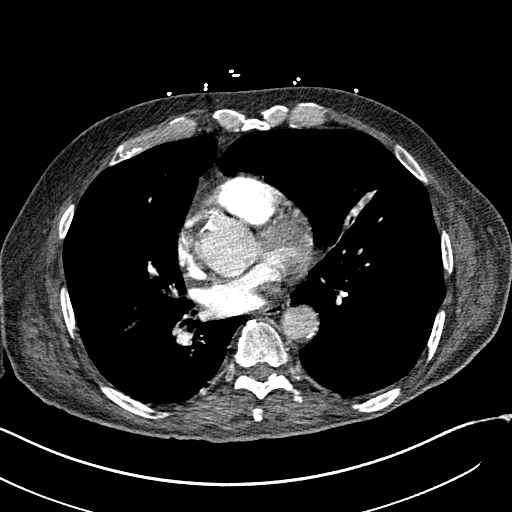
[im 157/346  lung]
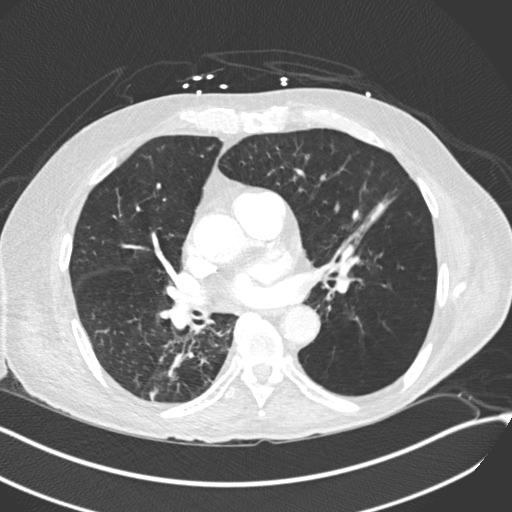
[im 189/346  soft-tissue]
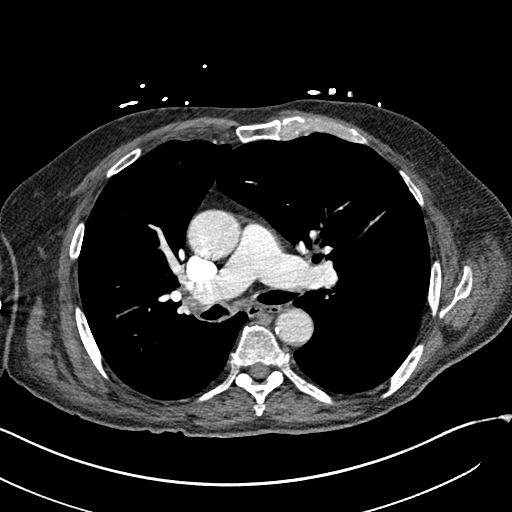
[im 204/346  lung]
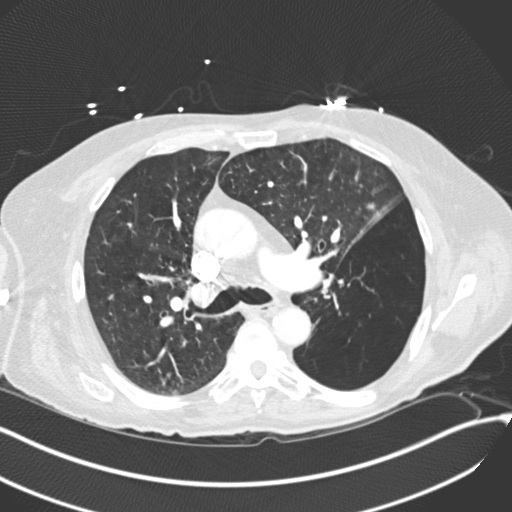
[im 236/346  soft-tissue]
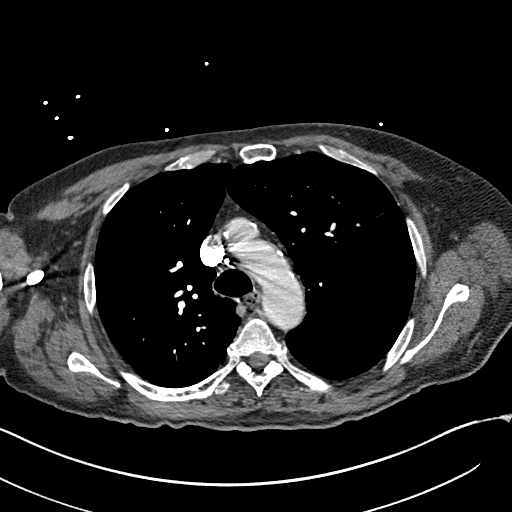
[im 251/346  lung]
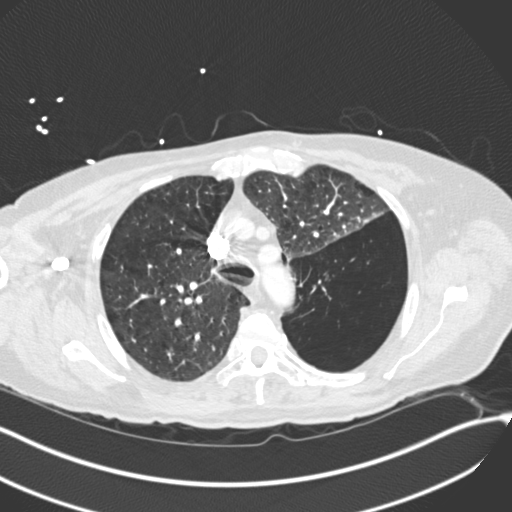
[im 283/346  soft-tissue]
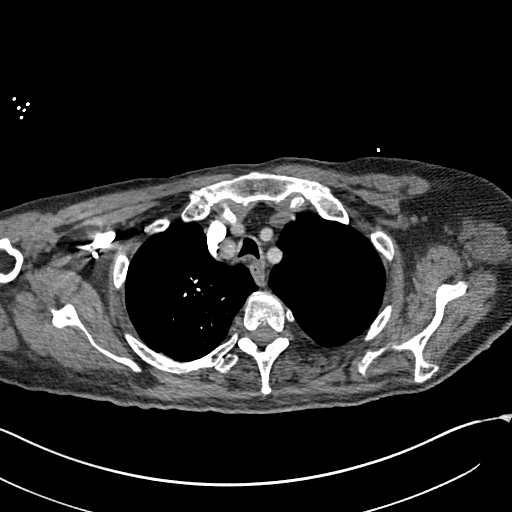
[im 298/346  lung]
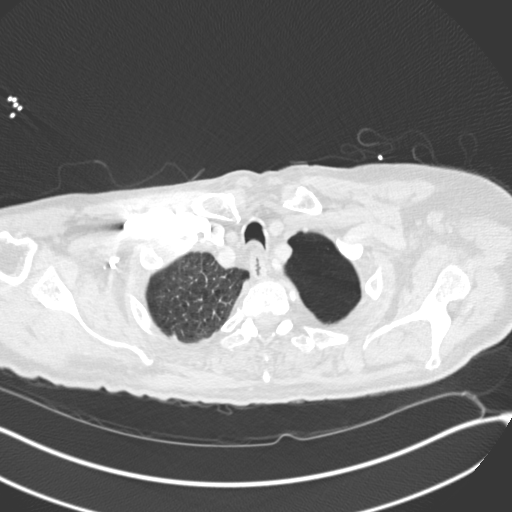
[im 330/346  soft-tissue]
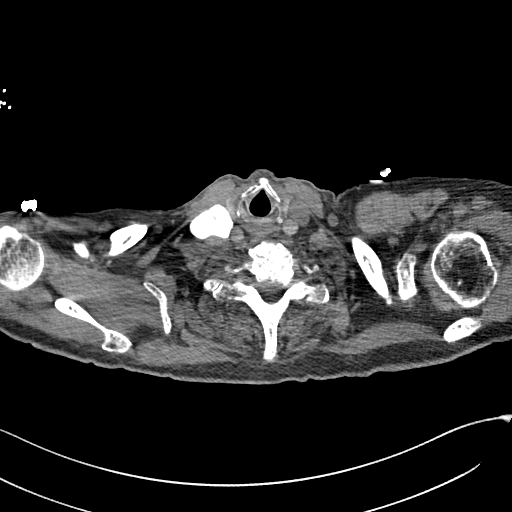

[Series 7: coronal mpr · coronal · 0.68mm/px · 3 of 151 slices shown]
[im 38/151  soft-tissue]
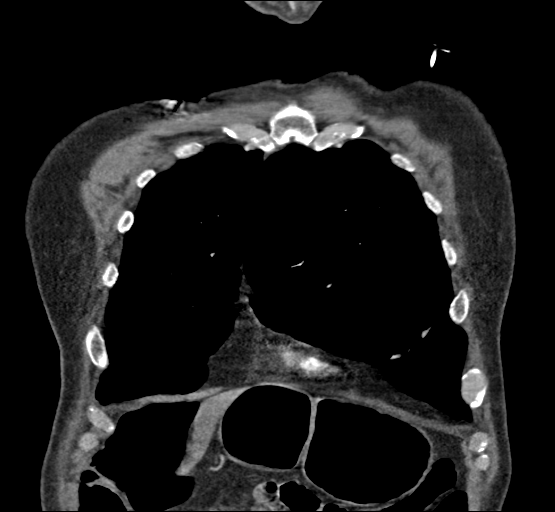
[im 76/151  soft-tissue]
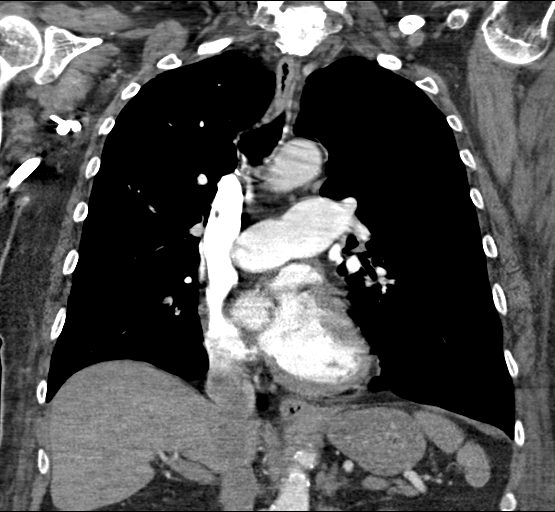
[im 113/151  soft-tissue]
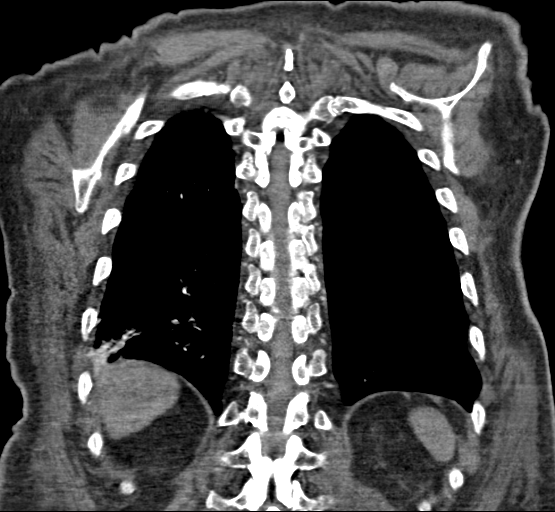

[17 of 46 positions shown; findings below may reference images not displayed]

FINDINGS: Cardiovascular: Satisfactory opacification of the pulmonary arteries
to the segmental level. No evidence of pulmonary embolism. Normal
heart size. No pericardial effusion. Moderate coronary artery
calcifications. The great vessels are normal in caliber. No aortic
dissection. Mild atherosclerotic calcifications are noted along the
thoracic aorta and arch branch vessels.

Mediastinum/Nodes: No enlarged mediastinal, hilar, or axillary lymph
nodes. Thyroid gland, trachea, and esophagus demonstrate no
significant findings.

Lungs/Pleura: There is hyperexpansion of the left lower lobe
displacing the oblique fissure anteriorly leading to mild
compressive atelectasis along the posterior aspect of the left upper
lobe. There is wall thickening of the left lower lobe segmental
bronchi, likely combination of mucus and inflammation. In the right
lower lobe, bronchial wall thickening is also evident with mucous
plugging opacifying the posterior basilar segmental bronchus. There
is associated peribronchovascular opacity that is most consistent
with atelectasis. A component of pneumonia is possible. Depression
of the oblique fissure posteriorly supports atelectasis. Milder
bronchial wall thickening is seen in the right upper lobe. There are
mild changes of emphysema.

No pulmonary edema.  No pleural effusion or pneumothorax.

Upper Abdomen: There is a vague low-attenuation 2.2 cm lesion in the
posterior segment of the right liver lobe. No other visualize liver
lesions. No acute findings in the upper abdomen.

Musculoskeletal: No chest wall abnormality. No acute or significant
osseous findings.

Review of the MIP images confirms the above findings.
IMPRESSION: 1. No evidence of a pulmonary embolism.
2. COPD/mild emphysema.
3. Hyperexpansion of left lower lobe which appears due to airways
disease and air trapping.
4. Bronchial wall thickening and mucous plugging in the right lower
lobe with associated peribronchovascular opacity, the latter most
consistent with atelectasis. Consider a component of
bronchopneumonia if there are consistent clinical symptoms. This
leads to right lower lobe volume.
5. No other evidence of acute abnormality.
6. Nonspecific 2.2 cm relative hyperattenuating lesion at the dome
of the liver, posterior segment of the right lobe. Recommend
followup assessment initially with ultrasound. If this does not
visualized or characterized lesion, liver MRI with and without
contrast would be recommended.

## 2019-10-09 IMAGING — DX DG CHEST 2V
2 series · 3 of 3 positions shown · non-contrast
Comparison: CTA chest 12/09/2016 and earlier.

CLINICAL DATA: 71-year-old male with unintentional weight loss.
Loss of appetite.

EXAM:
CHEST  2 VIEW

[Series 1: chest pa · 0.14mm/px · 2 of 2 slices shown]
[im 1/2]
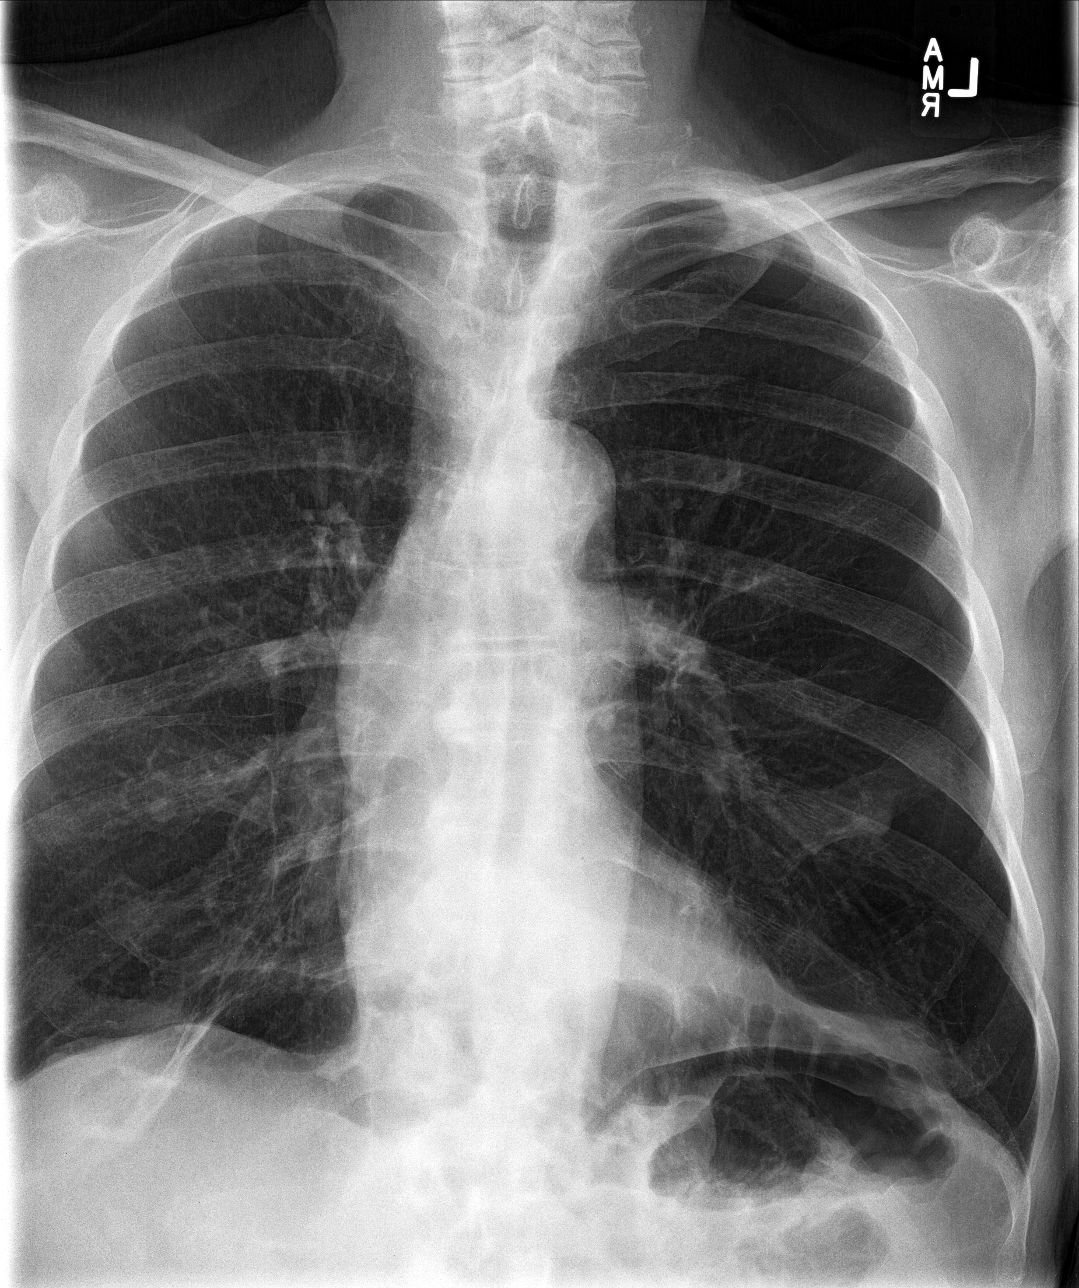
[im 2/2]
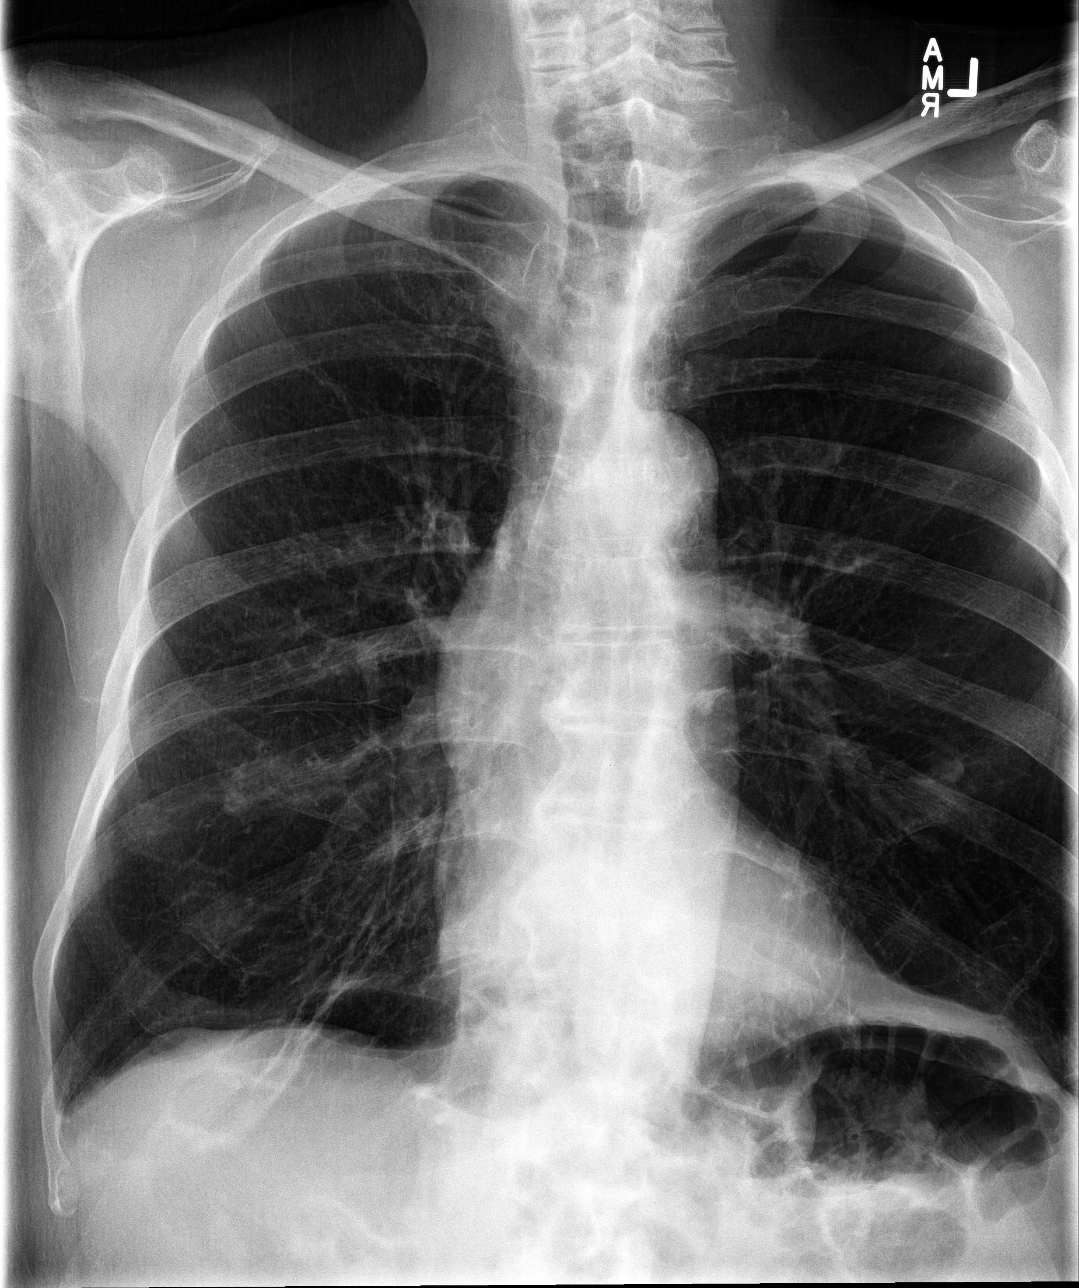

[chest lat]
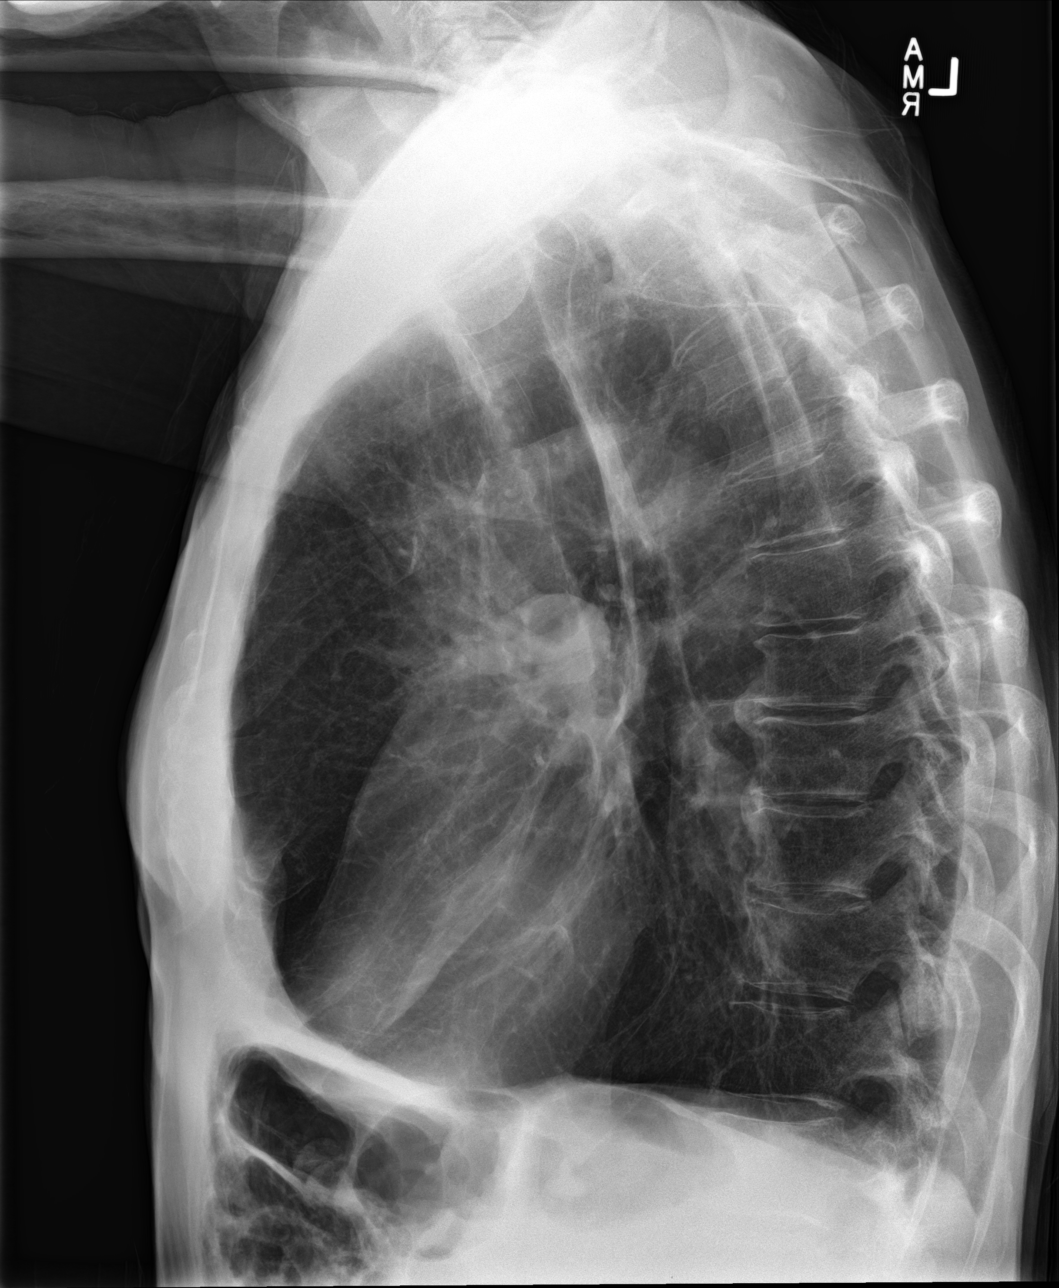

[3 of 3 positions shown; findings below may reference images not displayed]

FINDINGS: Chronic hyperinflation with bullous emphysema in the left lung.
Mediastinal contours are stable and normal aside from mild
tortuosity of the aorta. No pneumothorax, pulmonary edema, pleural
effusion or confluent pulmonary opacity. Negative visible bowel gas
pattern. No acute osseous abnormality identified.
IMPRESSION: Emphysema ([id number]). No acute cardiopulmonary abnormality.

## 2019-11-02 IMAGING — CT CT ABD-PELV W/ CM
3 of 6 series · 9 of 46 positions shown, 14 images · IV contrast (ISOVUE 300)
Comparison: Chest CT only on 12/09/2016

CLINICAL DATA: Dysphagia and vomiting for several months. 30 pound
weight loss. Distal esophageal mass on recent endoscopy.

EXAM:
CT CHEST, ABDOMEN, AND PELVIS WITH CONTRAST
TECHNIQUE: Multidetector CT imaging of the chest, abdomen and pelvis was
performed following the standard protocol during bolus
administration of intravenous contrast.
CONTRAST:  100mL [id number] IOPAMIDOL ([id number]) INJECTION 61%

[Series 2: cap with · axial · 0.70mm/px · z∈[-523,-103]mm · 5 of 127 slices shown, 10 images]
[im 22/127  soft-tissue]
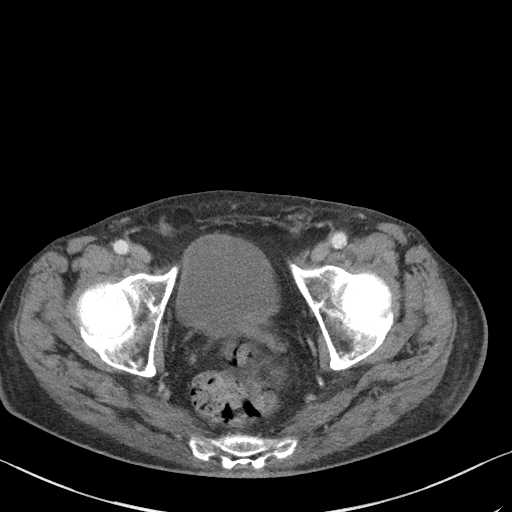
[im 22/127  bone]
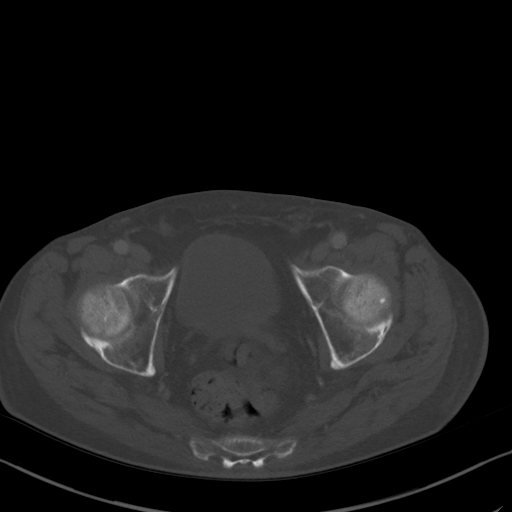
[im 43/127  soft-tissue]
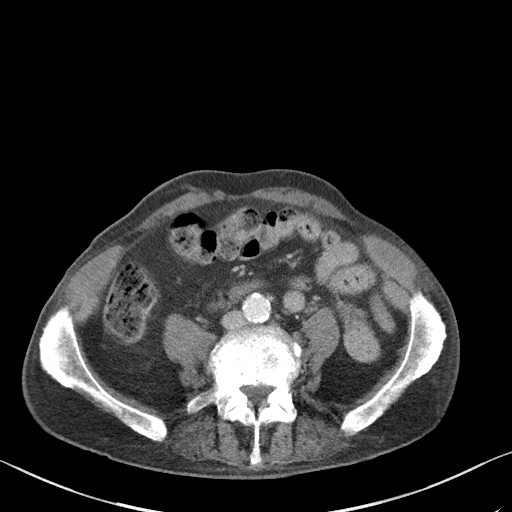
[im 43/127  lung]
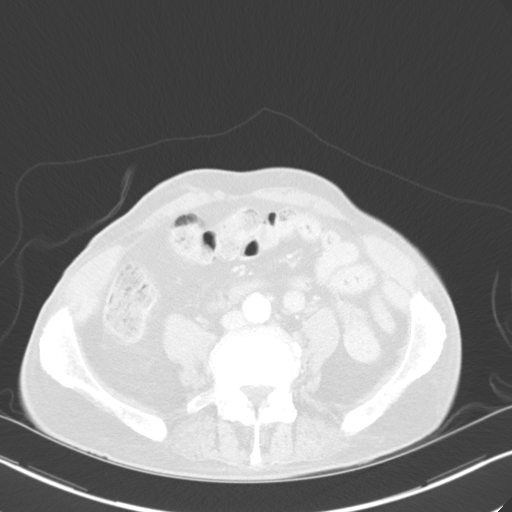
[im 64/127  soft-tissue]
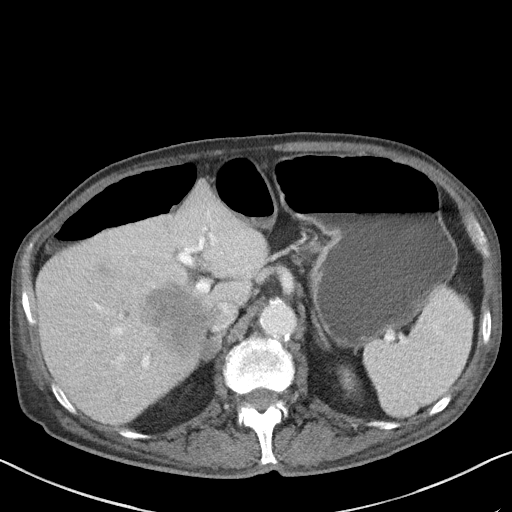
[im 64/127  lung]
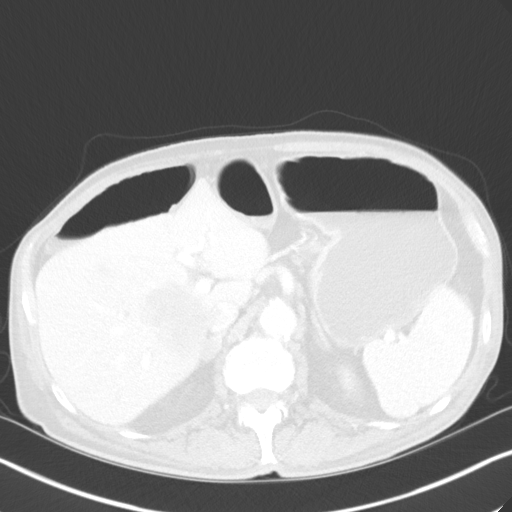
[im 85/127  soft-tissue]
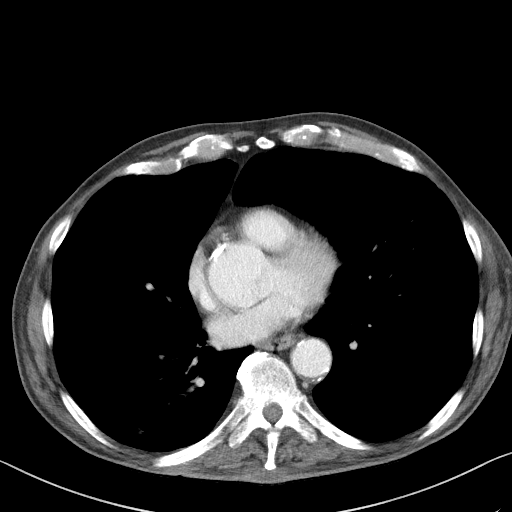
[im 85/127  lung]
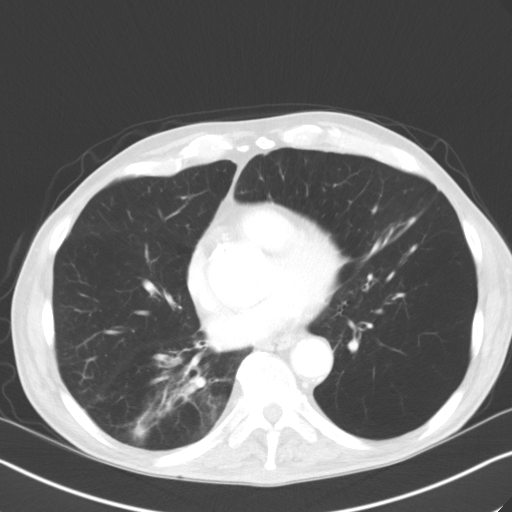
[im 106/127  soft-tissue]
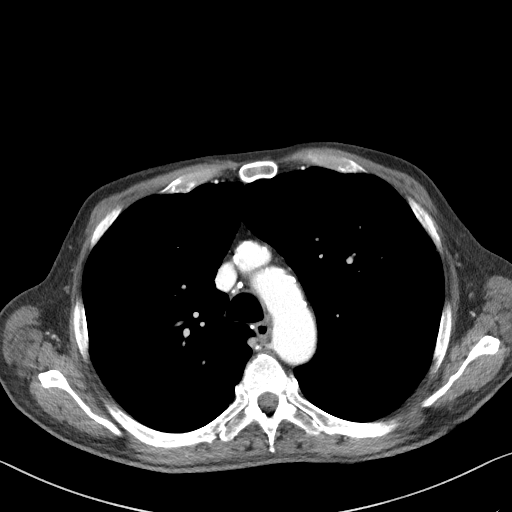
[im 106/127  lung]
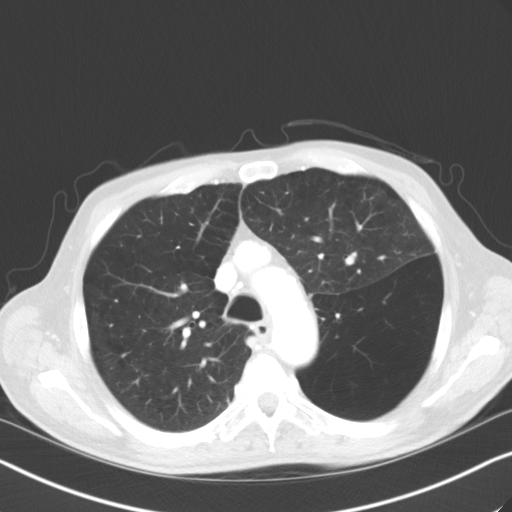

[Series 7: coronal · coronal · 0.70mm/px · 3 of 125 slices shown]
[im 42/125  soft-tissue]
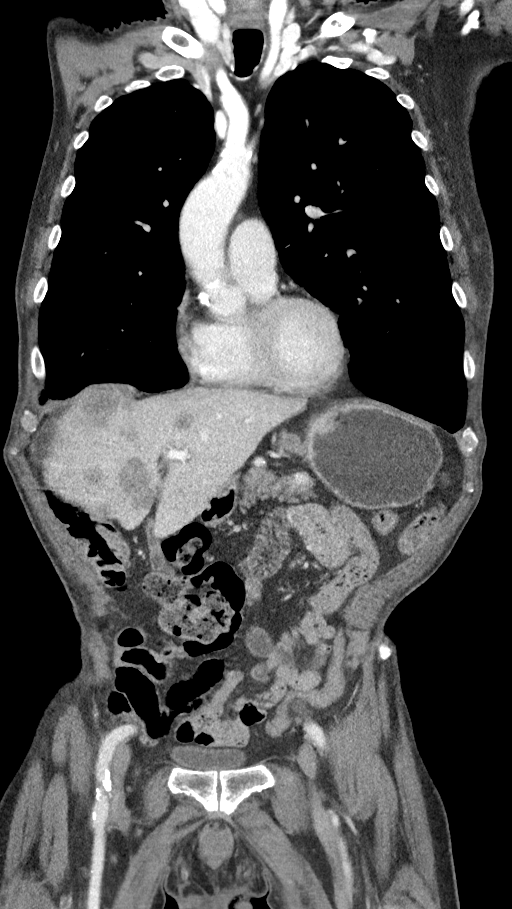
[im 56/125  soft-tissue]
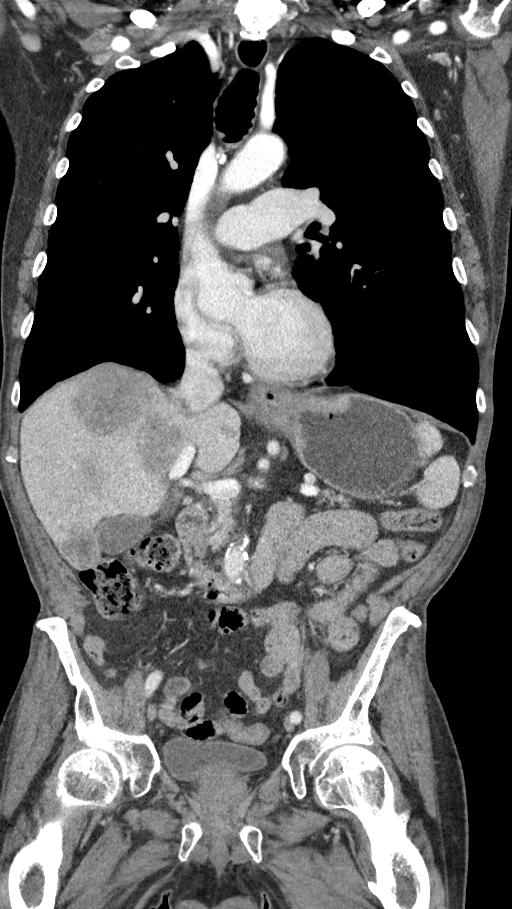
[im 69/125  soft-tissue]
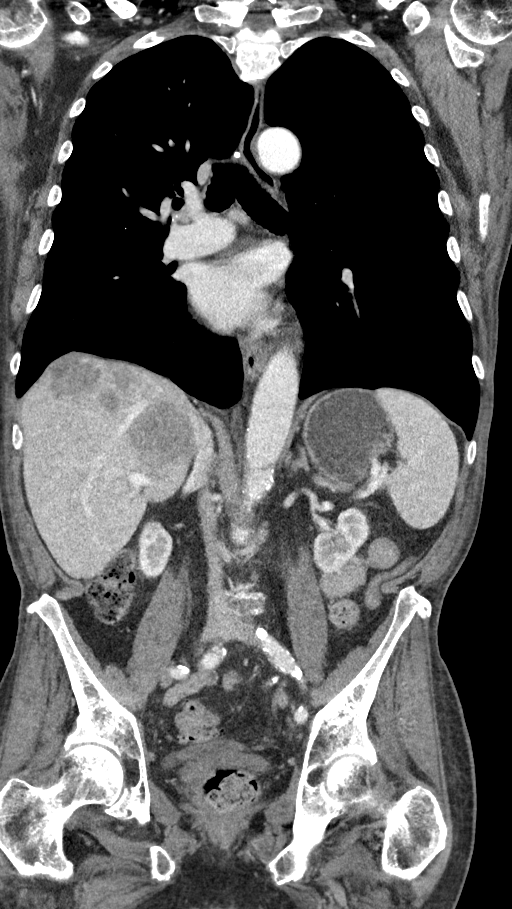

[Series 8: sagittal · sagittal · 0.52mm/px · 1 of 172 slices shown]
[im 58/172  soft-tissue]
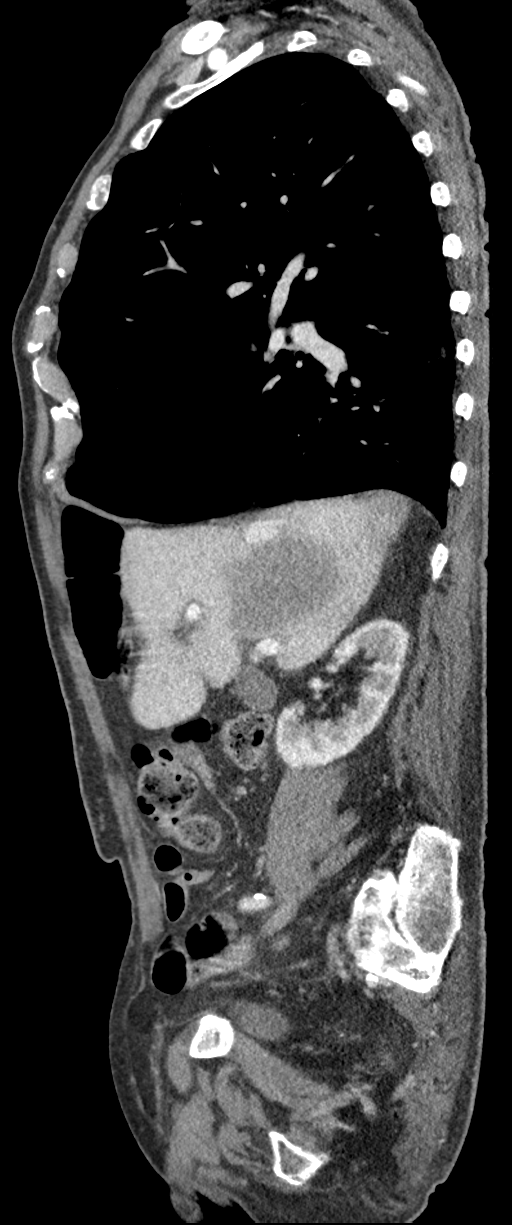

[9 of 46 positions shown; findings below may reference images not displayed]

FINDINGS: CT CHEST FINDINGS

Cardiovascular: No acute findings. Aortic and coronary artery
atherosclerosis.

Mediastinum/Lymph Nodes: 1.7 cm lymph node is seen in the inferior
aspect of the middle mediastinum along the left lateral wall of the
distal esophagus, consistent with lymph node metastasis. No other
pathologically enlarged lymph nodes identified within the thorax.

Lungs/Pleura: Moderate to severe pulmonary emphysema is seen. Right
lower lobe pleural-parenchymal scarring and mild traction
bronchiectasis again demonstrated. No suspicious pulmonary nodules
or masses identified . No evidence of pulmonary infiltrate or
pleural effusion.

Musculoskeletal:  No suspicious bone lesions identified.

CT ABDOMEN AND PELVIS FINDINGS

Hepatobiliary: Multiple hypovascular liver masses are seen in both
the right and left hepatic lobes. Index lesion in the central right
hepatic lobe measures 6.2 x 5.3 cm on image 62/2. Gallbladder is
unremarkable. No evidence of biliary ductal dilatation.

Pancreas:  No mass or inflammatory changes.

Spleen:  Within normal limits in size and appearance.

Adrenals/Urinary tract: 1.8 x 1.4 cm right adrenal mass is new since
previous study and highly suspicious for adrenal metastasis. Small
right renal cysts are noted, however there is no evidence of renal
masses or hydronephrosis. Unremarkable unopacified urinary bladder.

Stomach/Bowel: Irregular soft tissue mass is seen at the
gastroesophageal junction measuring 5.7 x 4.1 cm, consistent with
gastroesophageal carcinoma. Sigmoid diverticulosis is noted, however
there is no evidence of diverticulitis .

Vascular/Lymphatic: Mild lymphadenopathy in the gastrohepatic
ligament measuring 1.2 cm on image 62/2, consistent with metastatic
disease. No other pathologically enlarged lymph nodes identified
within the abdomen or pelvis.

Reproductive:  No mass or other significant abnormality identified.

Other:  None.

Musculoskeletal:  No suspicious bone lesions identified.
IMPRESSION: 4 x 6 cm mass at gastroesophageal junction, consistent with
carcinoma.

Diffuse hepatic metastases.

Mild paraesophageal and gastrohepatic ligament adenopathy,
consistent with metastatic disease.

New 1.8 cm right adrenal mass, consistent with adrenal metastasis .

Aortic Atherosclerosis ([id number]) and Emphysema (H42P4-ARS.1).
Coronary artery calcification.

## 2019-11-13 IMAGING — US US BIOPSY CORE LIVER
1 series · 13 of 15 positions shown · non-contrast
Comparison: none

INDICATION: 71-year-old with esophageal cancer and liver lesions.

[Series 1: us biopsy core liver · 0.23mm/px · 13 of 15 slices shown]
[im 1/15]
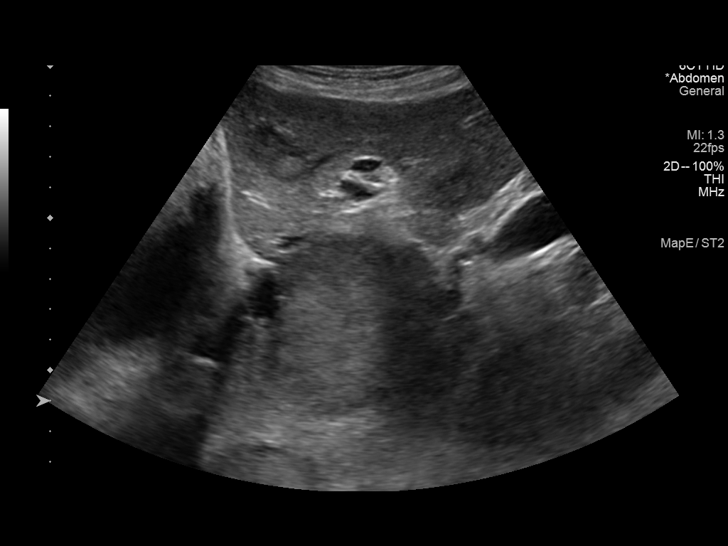
[im 2/15]
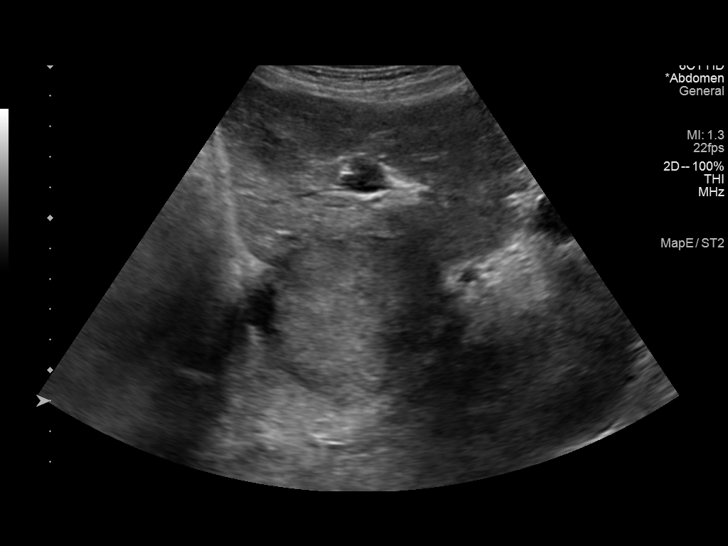
[im 3/15]
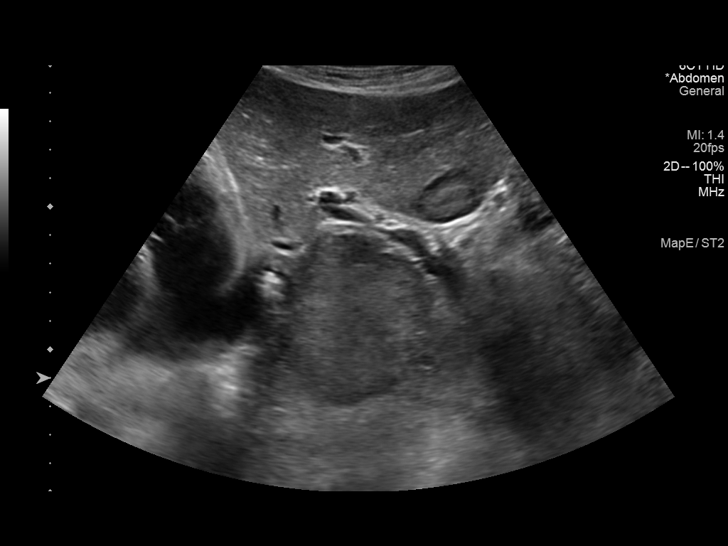
[im 5/15]
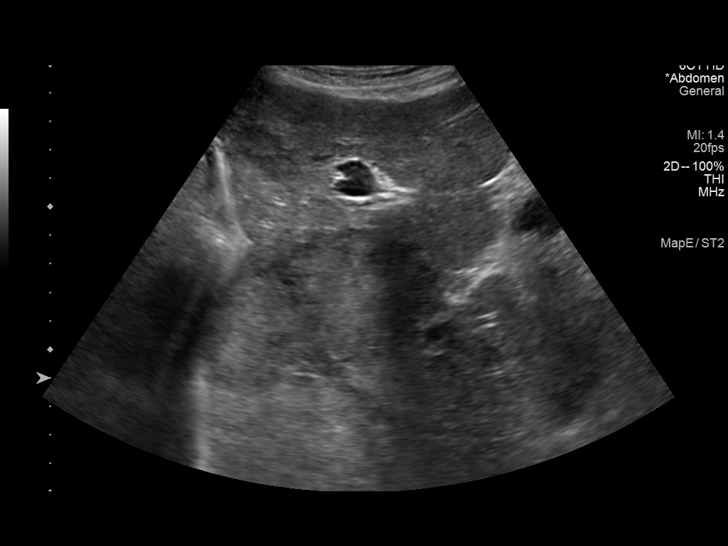
[im 6/15]
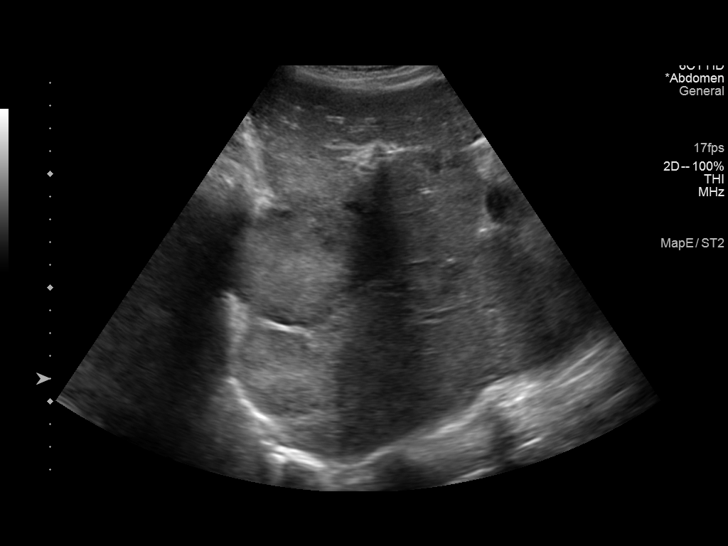
[im 7/15]
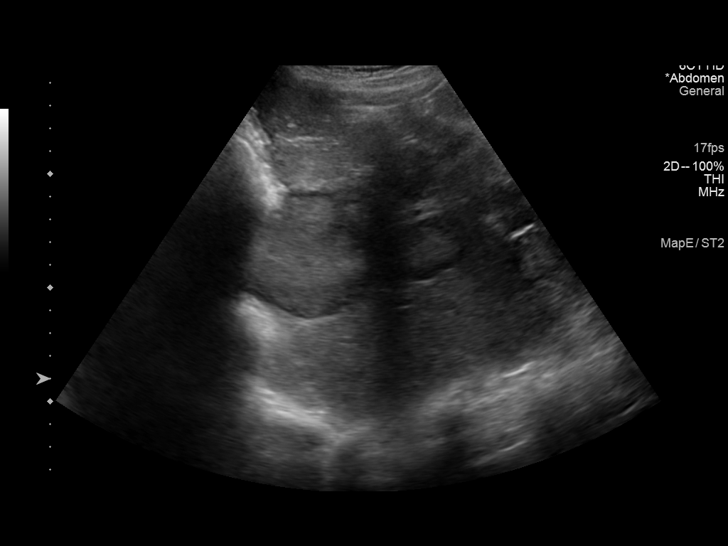
[im 8/15]
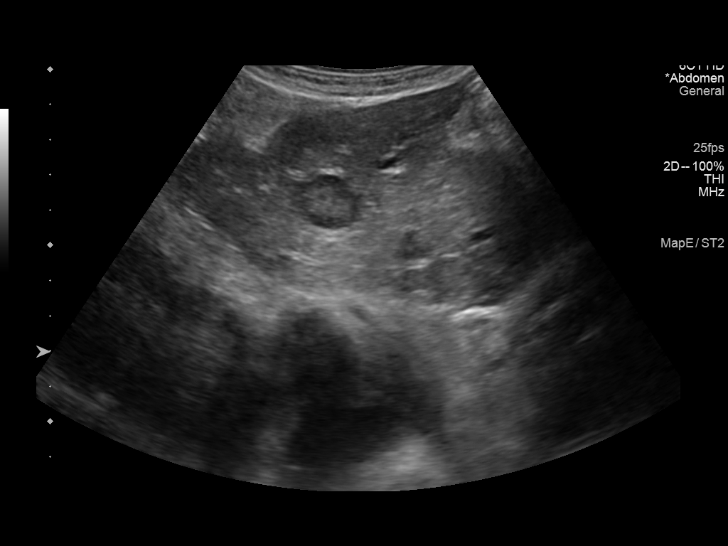
[im 9/15]
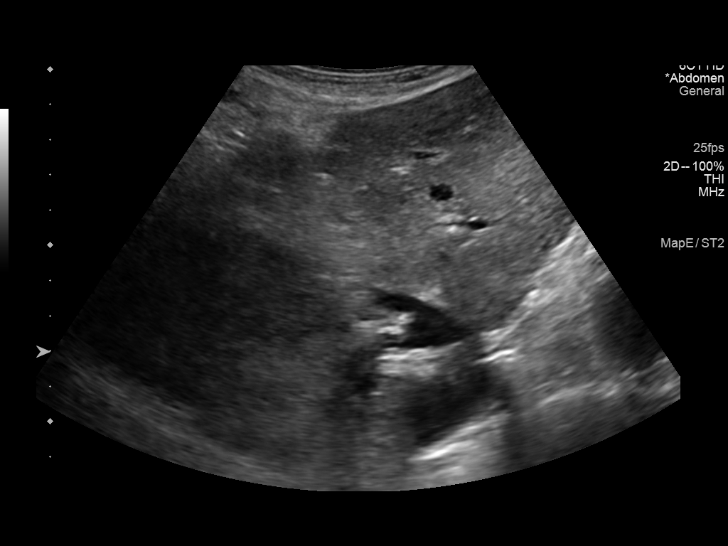
[im 10/15]
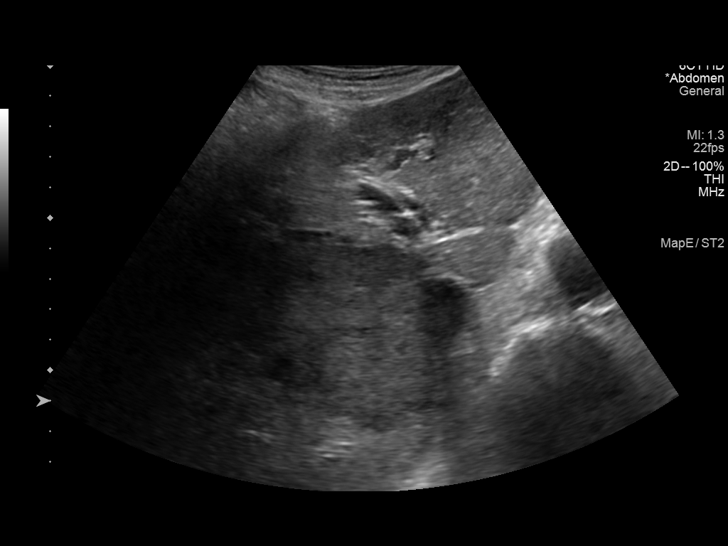
[im 11/15]
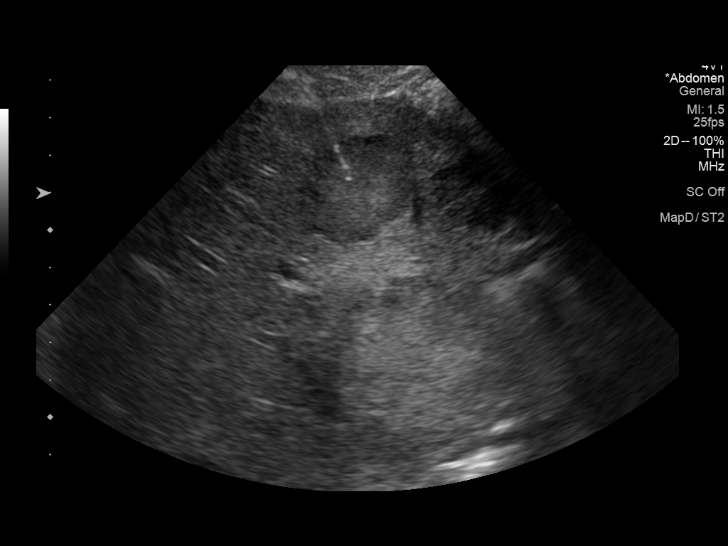
[im 13/15]
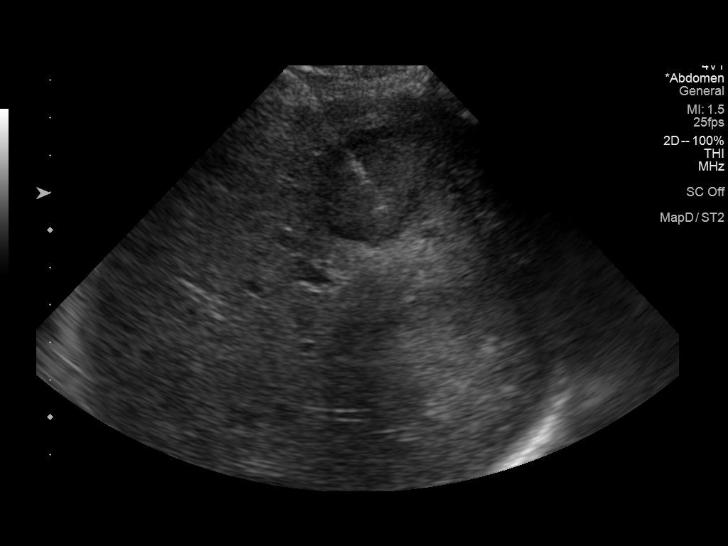
[im 14/15]
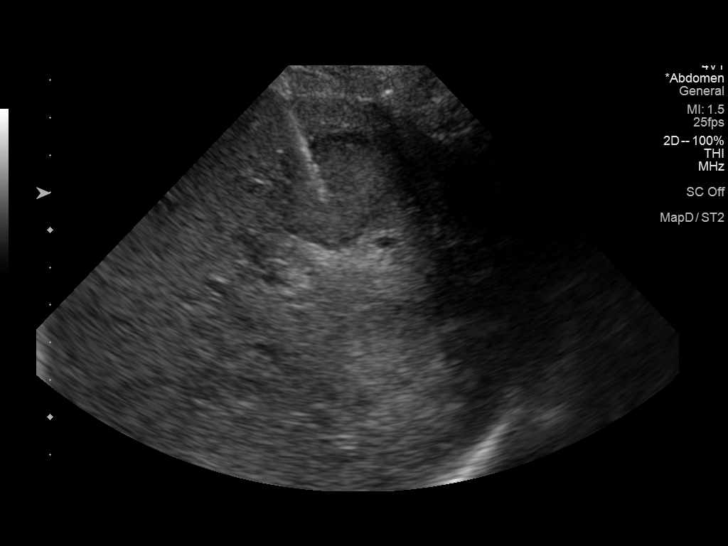
[im 15/15]
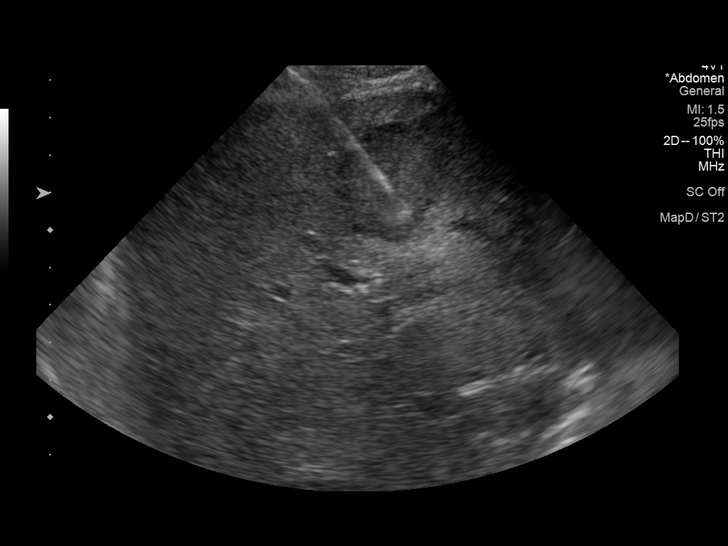

[13 of 15 positions shown; findings below may reference images not displayed]

EXAM:
ULTRASOUND GUIDED LIVER LESION BIOPSY

MEDICATIONS:
None.

ANESTHESIA/SEDATION:
Moderate (conscious) sedation was employed during this procedure. A
total of Versed 1.0 mg and Fentanyl 50 mcg was administered
intravenously.

Moderate Sedation Time: 12 minutes. The patient's level of
consciousness and vital signs were monitored continuously by
radiology nursing throughout the procedure under my direct
supervision.

FLUOROSCOPY TIME:  None

COMPLICATIONS:
None immediate.

PROCEDURE:
Informed written consent was obtained from the patient after a
thorough discussion of the procedural risks, benefits and
alternatives. All questions were addressed. A timeout was performed
prior to the initiation of the procedure.

Liver was evaluated with ultrasound. A lesion in the inferior right
hepatic lobe was targeted for biopsy. Right subcostal area was
prepped with chlorhexidine and sterile field was created. Skin and
soft tissues were anesthetized with 1% lidocaine. 17 gauge coaxial
was directed into the lesion with ultrasound guidance. A total of 4
core biopsies were obtained with an 18 gauge device. Core samples
were placed in formalin. 17 gauge needle was removed without
complication. Bandage placed over the puncture site.
FINDINGS: Several lesions scattered throughout the liver. A right hepatic
lesion was targeted for biopsy. Biopsy needle identified within the
lesion. No evidence for bleeding or hematoma formation following the
core biopsies.
IMPRESSION: Successful ultrasound-guided core biopsies of a right hepatic
lesion.

## 2019-11-13 IMAGING — XA IR FLUORO GUIDE CV LINE*R*
1 series · 1 of 1 positions shown · non-contrast
Comparison: None.

INDICATION: 71-year-old with metastatic esophageal cancer.

EXAM:
FLUOROSCOPIC AND ULTRASOUND GUIDED PLACEMENT OF A SUBCUTANEOUS PORT

[Series 300: line placements · 1 of 1 slices shown]
[im 1/1]
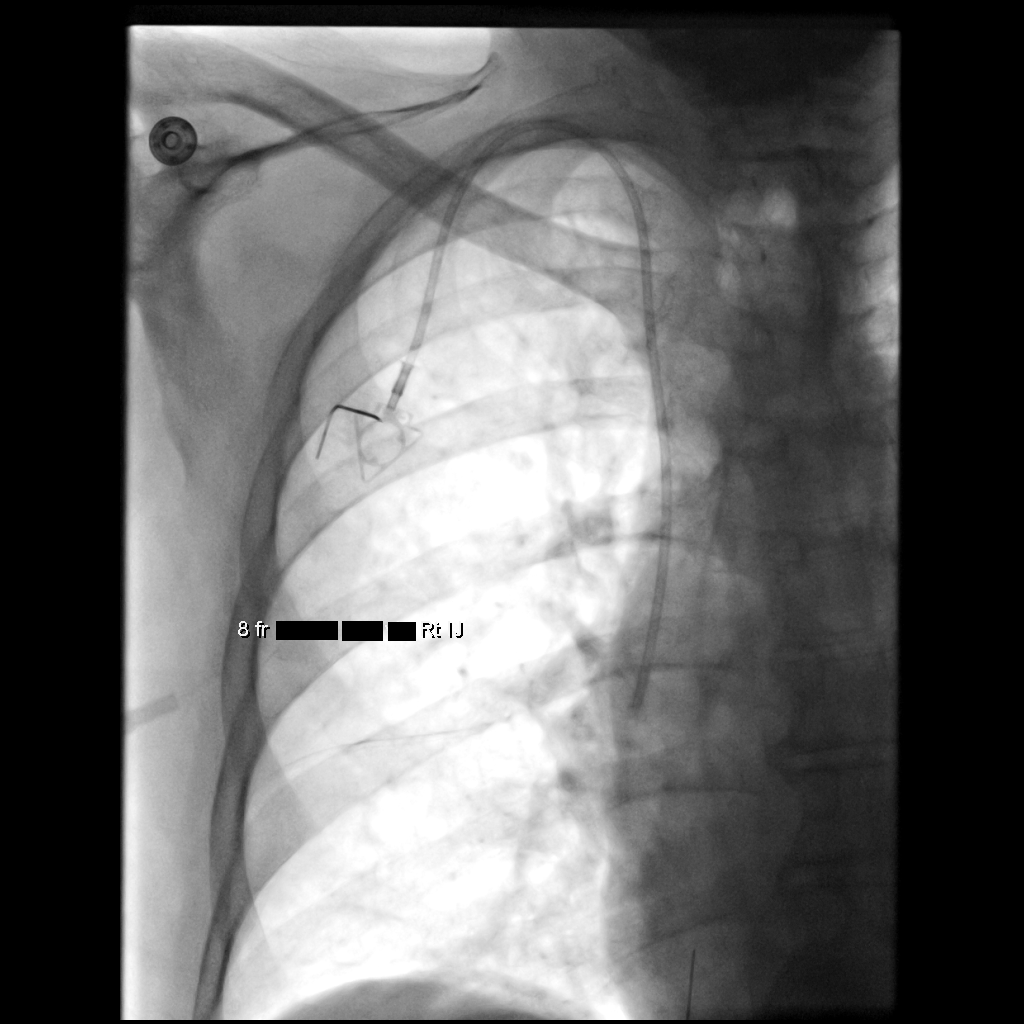

[1 of 1 positions shown; findings below may reference images not displayed]

MEDICATIONS:
Ancef 2 g; The antibiotic was administered within an appropriate
time interval prior to skin puncture.

ANESTHESIA/SEDATION:
Versed 1.0 mg IV; Fentanyl 50 mcg IV;

Moderate Sedation Time:  32 minutes

The patient was continuously monitored during the procedure by the
interventional radiology nurse under my direct supervision.

FLUOROSCOPY TIME:  18 seconds, 1 mGy

COMPLICATIONS:
None immediate.

PROCEDURE:
The procedure, risks, benefits, and alternatives were explained to
the patient. Questions regarding the procedure were encouraged and
answered. The patient understands and consents to the procedure.

Patient was placed supine on the interventional table. Ultrasound
confirmed a patent right internal jugular vein. The right chest and
neck were cleaned with a skin antiseptic and a sterile drape was
placed. Maximal barrier sterile technique was utilized including
caps, mask, sterile gowns, sterile gloves, sterile drape, hand
hygiene and skin antiseptic. The right neck was anesthetized with 1%
lidocaine. Small incision was made in the right neck with a blade.
Micropuncture set was placed in the right internal jugular vein with
ultrasound guidance. The micropuncture wire was used for measurement
purposes. The right chest was anesthetized with 1% lidocaine with
epinephrine. #15 blade was used to make an incision and a
subcutaneous port pocket was formed. 8 french Power Port was
assembled. Subcutaneous tunnel was formed with a stiff tunneling
device. The port catheter was brought through the subcutaneous
tunnel. The port was placed in the subcutaneous pocket. The
micropuncture set was exchanged for a peel-away sheath. The catheter
was placed through the peel-away sheath and the tip was positioned
at the superior cavoatrial junction. Catheter placement was
confirmed with fluoroscopy. The port was accessed and flushed with
heparinized saline. The port pocket was closed using two layers of
absorbable sutures and Dermabond. The vein skin site was closed
using a single layer of absorbable suture and Dermabond. Sterile
dressings were applied. Patient tolerated the procedure well without
an immediate complication. Ultrasound and fluoroscopic images were
taken and saved for this procedure.
IMPRESSION: Placement of a subcutaneous port device.

## 2020-01-19 IMAGING — CT CT CHEST W/ CM
2 of 5 series · 12 of 36 positions shown, 15 images · IV contrast (ISOVUE 300)
Comparison: 11/03/2017

CLINICAL DATA: Stage IV esophageal cancer.  Restaging.

EXAM:
CT CHEST, ABDOMEN, AND PELVIS WITH CONTRAST
TECHNIQUE: Multidetector CT imaging of the chest, abdomen and pelvis was
performed following the standard protocol during bolus
administration of intravenous contrast.
CONTRAST:  100mL [id number] IOPAMIDOL ([id number]) INJECTION 61%

[Series 2: cap with · axial · 0.86mm/px · z∈[-445,+45]mm · 9 of 124 slices shown, 12 images]
[im 13/124  mediastinal]
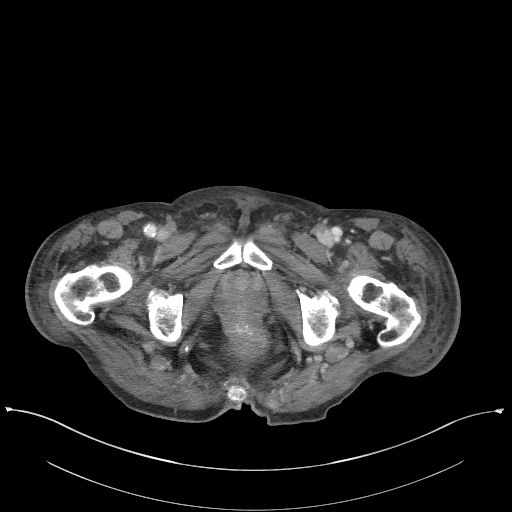
[im 13/124  lung]
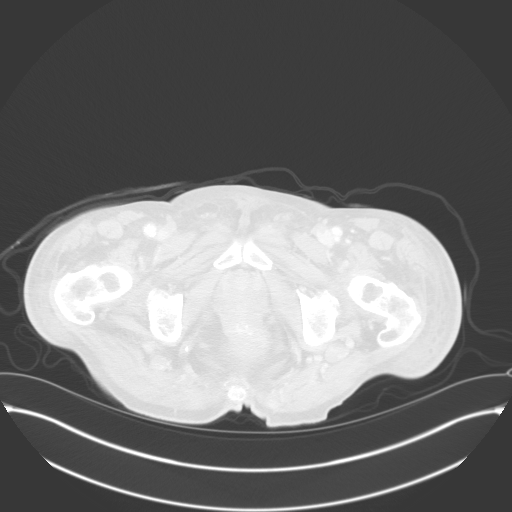
[im 25/124  lung]
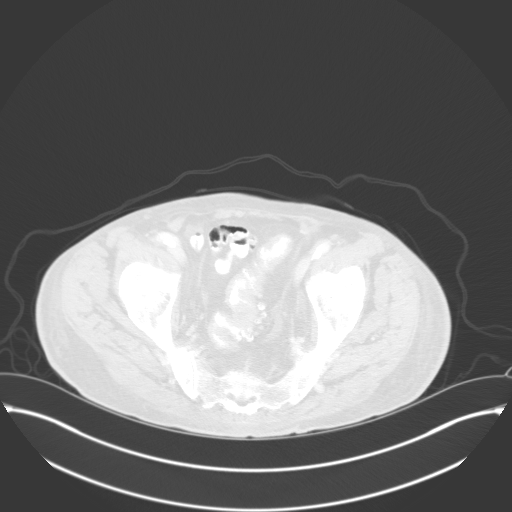
[im 37/124  lung]
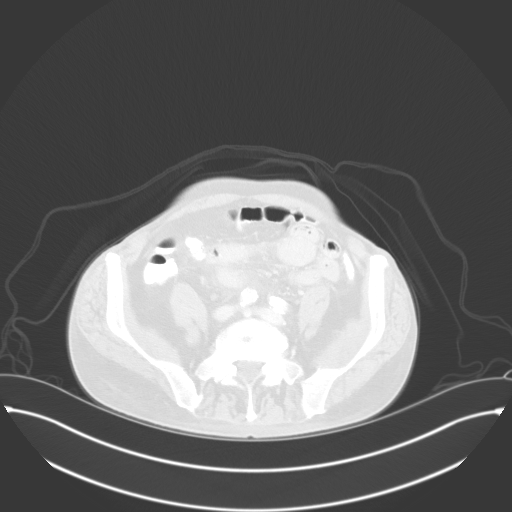
[im 50/124  lung]
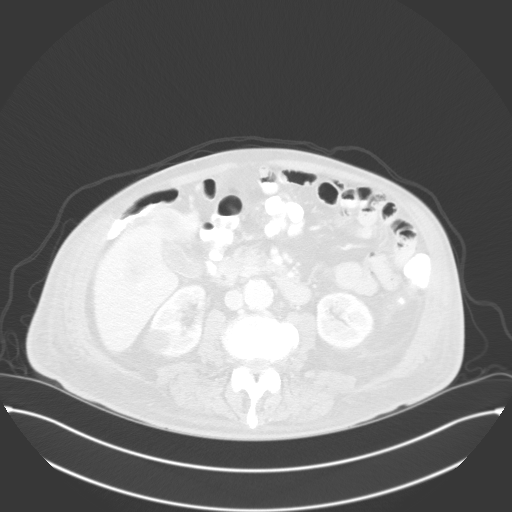
[im 62/124  mediastinal]
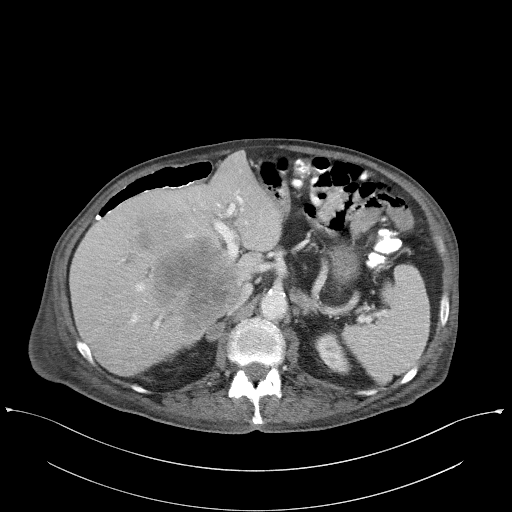
[im 62/124  lung]
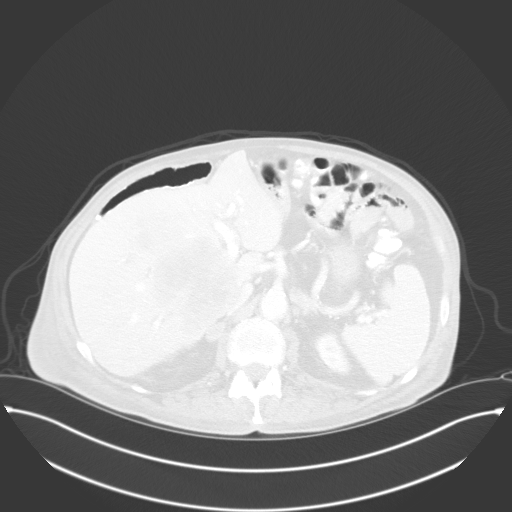
[im 74/124  lung]
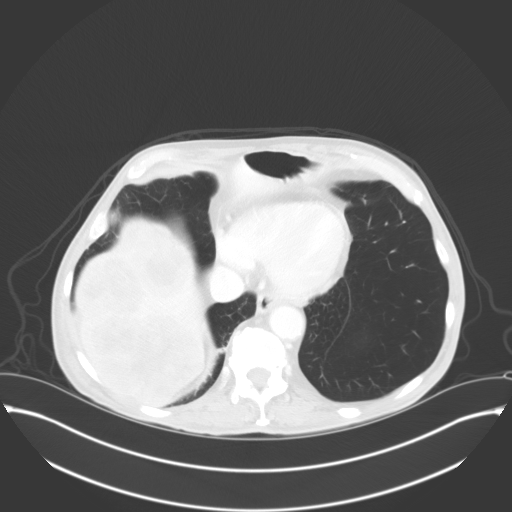
[im 87/124  lung]
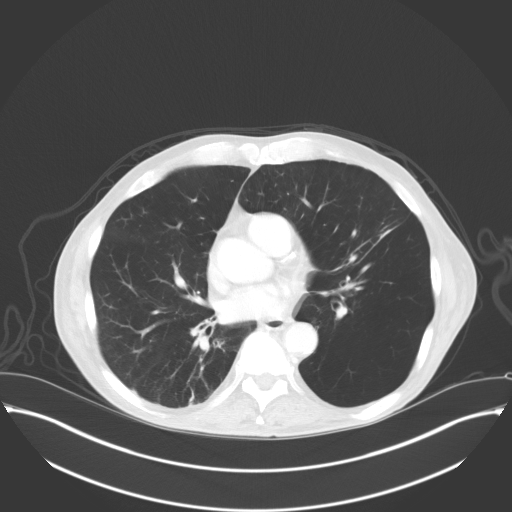
[im 99/124  lung]
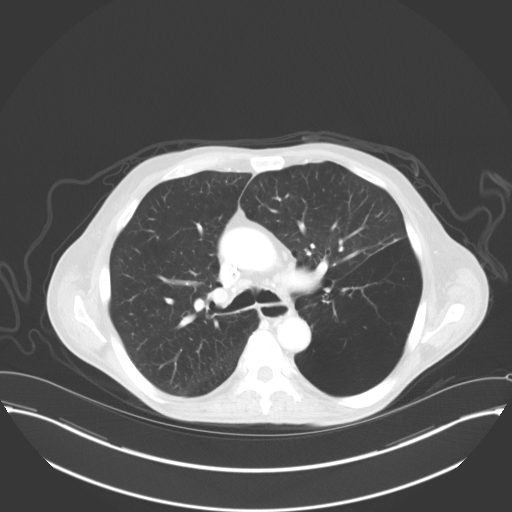
[im 111/124  mediastinal]
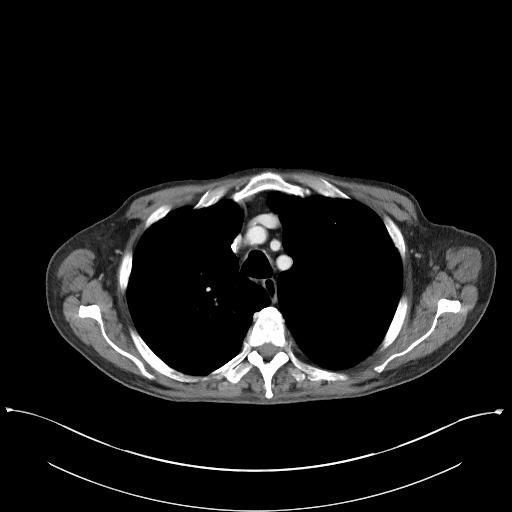
[im 111/124  lung]
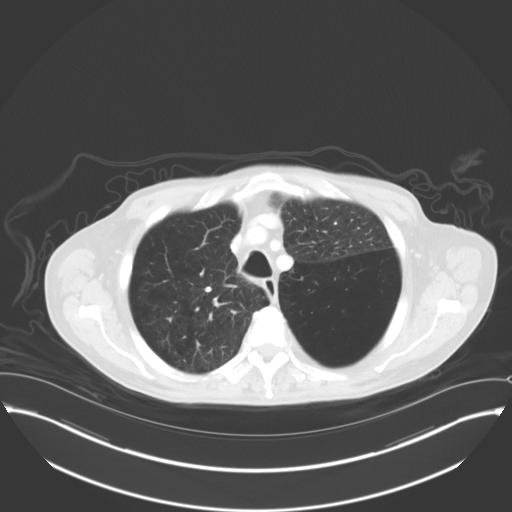

[Series 4: coronals · coronal · 0.94mm/px · 3 of 129 slices shown]
[im 26/129  lung]
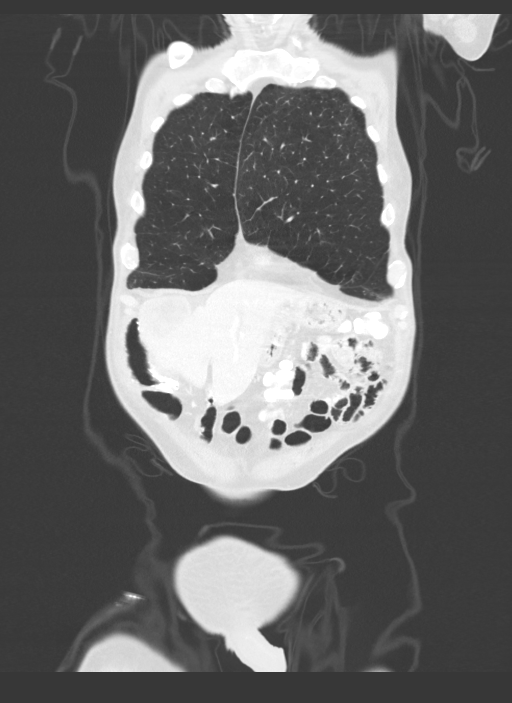
[im 52/129  lung]
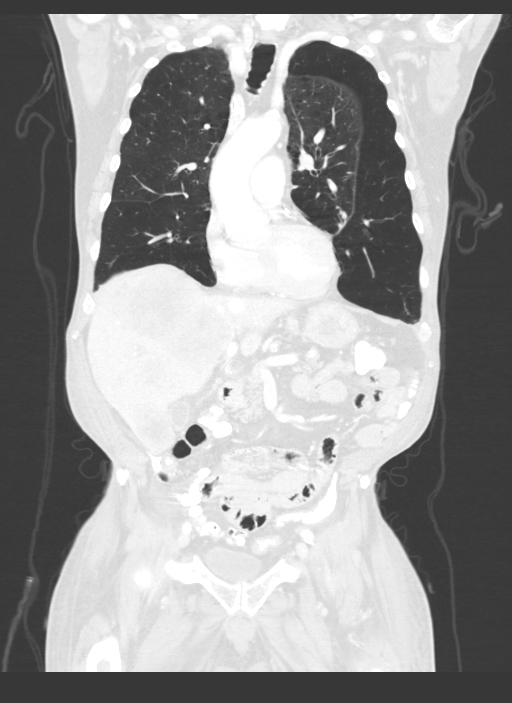
[im 77/129  lung]
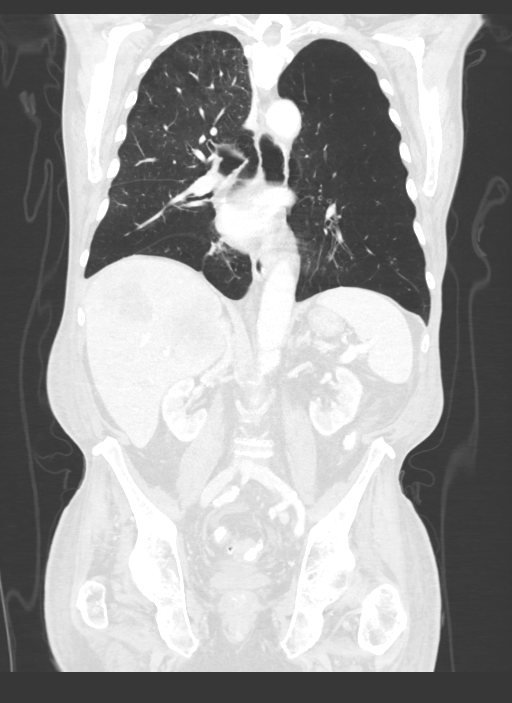

[12 of 36 positions shown; findings below may reference images not displayed]

FINDINGS: CT CHEST FINDINGS

Cardiovascular: The heart size is normal. No pericardial effusion.
Coronary artery calcification is evident. Right Port-A-Cath tip is
positioned in the mid to distal SVC.

Mediastinum/Nodes: Distal paraesophageal lymph node measured
previously at 1.7 cm short axis is 1.5 cm today. No other
mediastinal lymphadenopathy. There is no hilar lymphadenopathy.
Similar appearance of soft tissue fullness distal esophagus at the
esophagogastric junction tracking into the gastric cardia (see
below). There is no axillary lymphadenopathy.

Lungs/Pleura: Centrilobular and paraseptal emphysema noted.
Subsegmental atelectasis noted right lower lobe. Atelectasis in the
lingula is slightly progressed in the interval. No suspicious
pulmonary nodule or mass. No focal airspace consolidation.

Musculoskeletal: Bone windows reveal no worrisome lytic or sclerotic
osseous lesions.

CT ABDOMEN PELVIS FINDINGS

Hepatobiliary: Bulky liver metastases again noted. Index lesion
measured previously in the medial right liver at 6.2 x 5.3 cm now
measures 8.9 x 7.6 cm. Lesion in the anterior right dome of liver
seen on image 56 of series 2 today measures 9.4 x 7.4 cm. This
compares to 6.2 x 5.4 cm when I remeasure the same lesion on the
prior exam. Other lesions scattered in both lobes of the liver show
similar levels of progression.

There is no evidence for gallstones, gallbladder wall thickening, or
pericholecystic fluid. No intrahepatic or extrahepatic biliary
dilation.

Pancreas: No focal mass lesion. No dilatation of the main duct. No
intraparenchymal cyst. No peripancreatic edema.

Spleen: No splenomegaly. No focal mass lesion.

Adrenals/Urinary Tract: 1.8 x 1.4 cm right adrenal nodule measured
on the prior study is 1.3 x 1.9 cm today, unchanged. 11 mm left
adrenal nodule appears new in the interval. Bilateral renal cysts
are again noted. No evidence for hydroureter. The urinary bladder
appears normal for the degree of distention.

Stomach/Bowel: Ill-defined lesion in the gastroesophageal junction
is again identified. This measures 5.6 x 4.0 cm today compared to
5.7 x 4.1 cm previously, not substantially changed. Duodenum is
normally positioned as is the ligament of Treitz. No small bowel
wall thickening. No small bowel dilatation. The terminal ileum is
normal. The appendix is normal. Diverticular changes are noted in
the left colon without evidence of diverticulitis. Wall thickening
in the sigmoid colon likely related to the underlying diverticular
disease.

Vascular/Lymphatic: There is abdominal aortic atherosclerosis
without aneurysm. 1.2 cm short axis gastrohepatic ligament lymph
node measured on the previous study is 1.0 cm today. No
hepatoduodenal ligament lymphadenopathy. No intraperitoneal or
retroperitoneal lymphadenopathy. No pelvic sidewall lymphadenopathy.

Reproductive: Prostate gland is enlarged.

Other: Small volume free fluid identified in the pelvis, new in the
interval.

Musculoskeletal: Right groin hernia contains only fat. Fluid or scar
from previous herniorrhaphy noted in the left groin region. Bone
windows reveal no worrisome lytic or sclerotic osseous lesions.
IMPRESSION: 1. Similar appearance of the gastroesophageal junction mass
measuring approximately 5.6 x 4.0 cm today.
2. Interval progression of bulky hepatic metastases.
3. Stable right adrenal nodule with new left adrenal nodule
concerning for metastatic disease.
4. Distal paraesophageal and gastrohepatic ligament lymphadenopathy
measures minimally smaller today.
5.  Aortic Atherosclerois ([id number])
6.  Emphysema. ([id number])

## 2020-02-01 IMAGING — DX DG CHEST 1V PORT
1 series · 1 of 1 positions shown · non-contrast
Comparison: Chest CT 01/20/2018

CLINICAL DATA: Shortness of Breath

EXAM:
PORTABLE CHEST 1 VIEW

[chest]
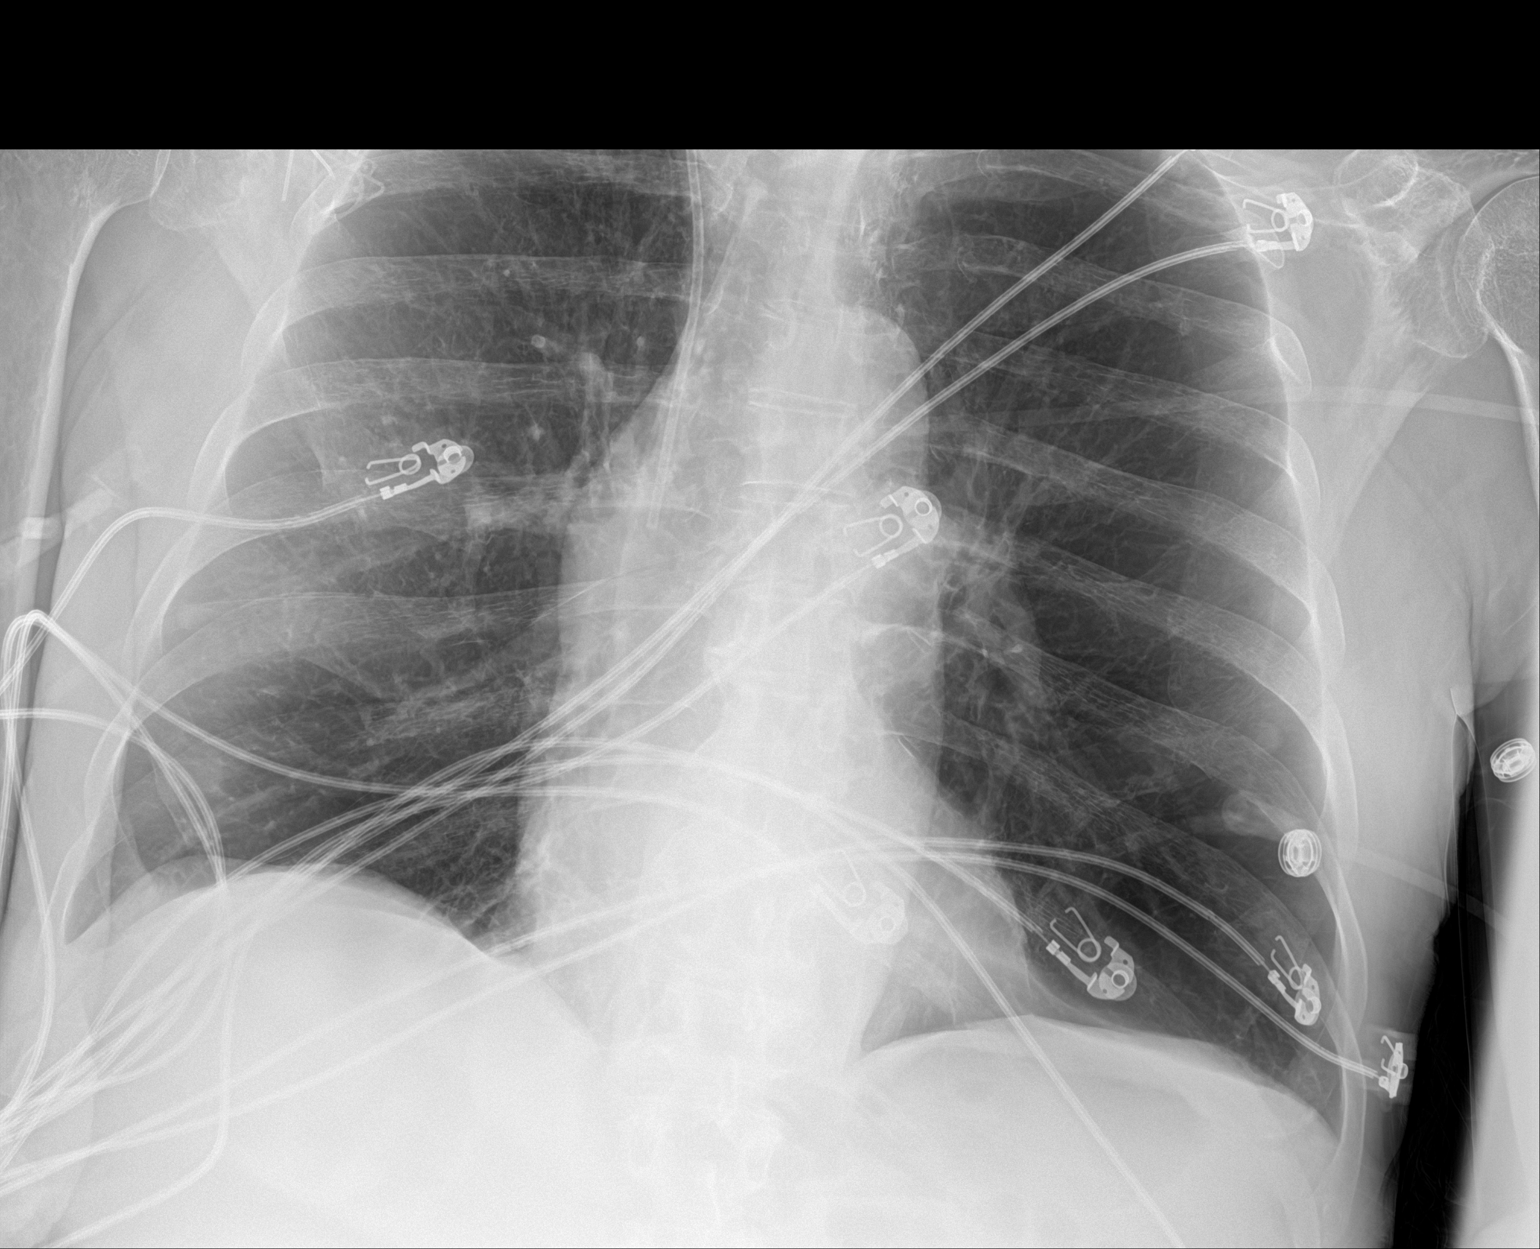

[1 of 1 positions shown; findings below may reference images not displayed]

FINDINGS: Right Port-A-Cath tip is in the SVC. Heart is normal size. No
confluent airspace opacities or effusions. No acute bony
abnormality.
IMPRESSION: No active cardiopulmonary disease.

## 2020-02-01 IMAGING — DX DG ABDOMEN 1V
1 series · 1 of 1 positions shown · non-contrast
Comparison: None.

CLINICAL DATA: Difficulty breathing, lower abdominal pain.

EXAM:
ABDOMEN - 1 VIEW

[abdomen]
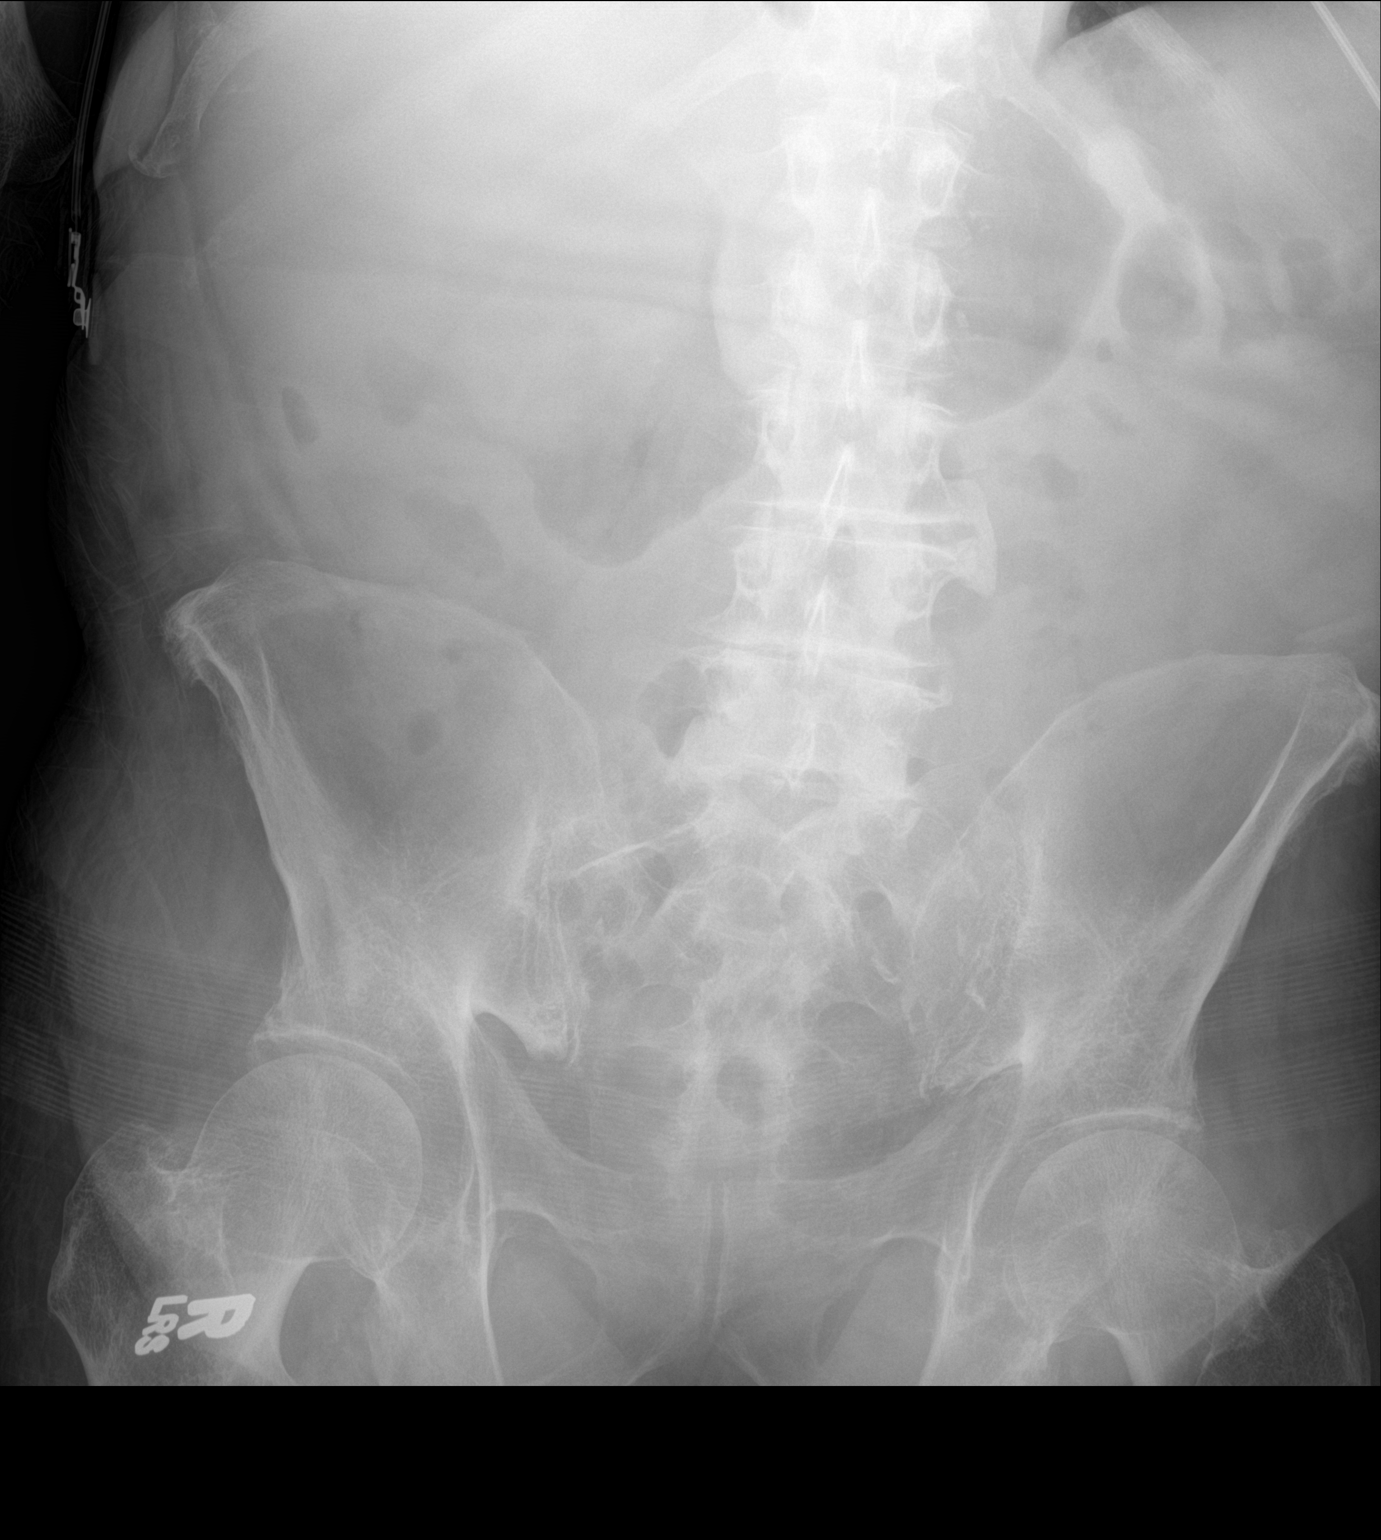

[1 of 1 positions shown; findings below may reference images not displayed]

FINDINGS: Nonobstructive bowel gas pattern. Hepatomegaly likely present,
likely related to the bulky hepatic metastases seen on prior CT. No
free air.
IMPRESSION: No obstruction or free air.

Probable hepatomegaly. This is likely related to the bulky
metastatic disease seen on prior CT.

## 2020-02-03 IMAGING — DX DG ANKLE 2V *L*
2 series · 2 of 2 positions shown · non-contrast
Comparison: None.

CLINICAL DATA: Fall on left ankle 3 days ago.  Pain.  Swelling.

EXAM:
LEFT ANKLE - 2 VIEW

[ankle ap]
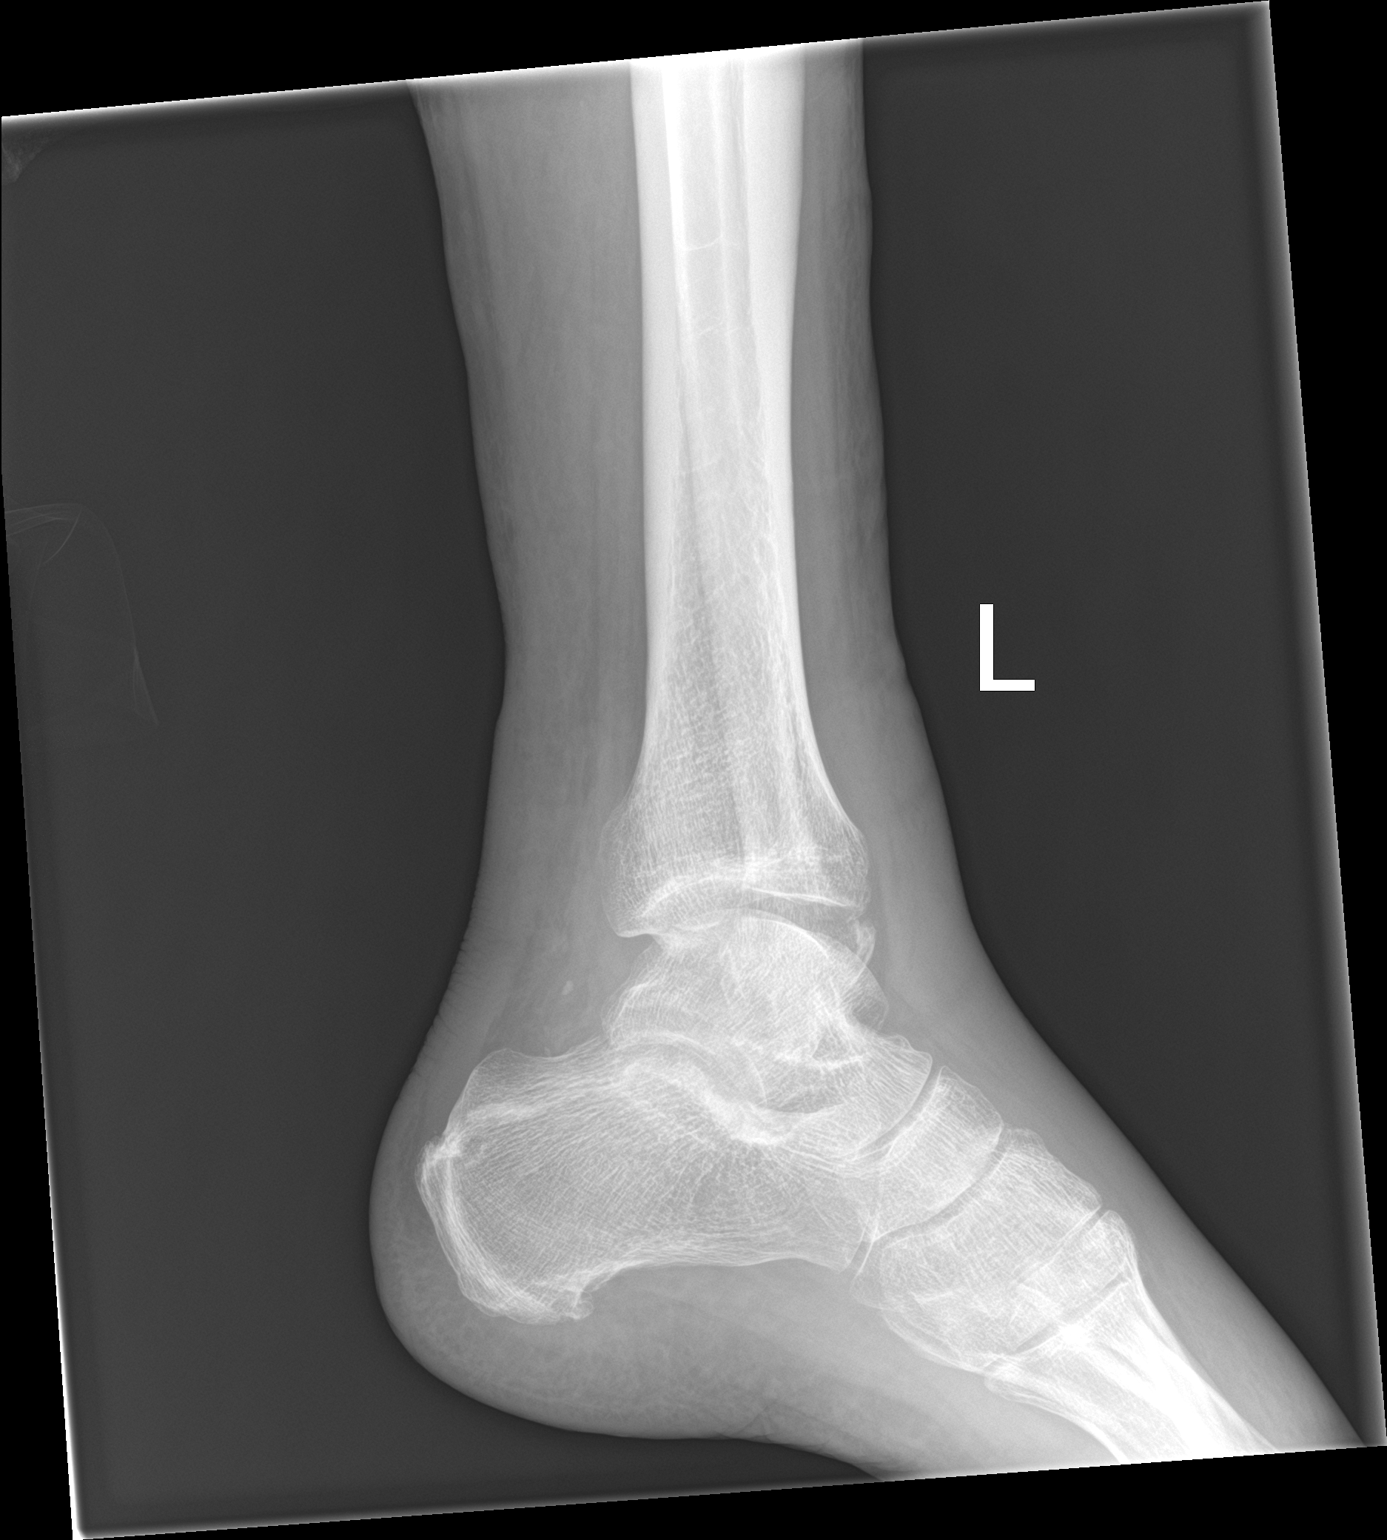

[ankle lat]
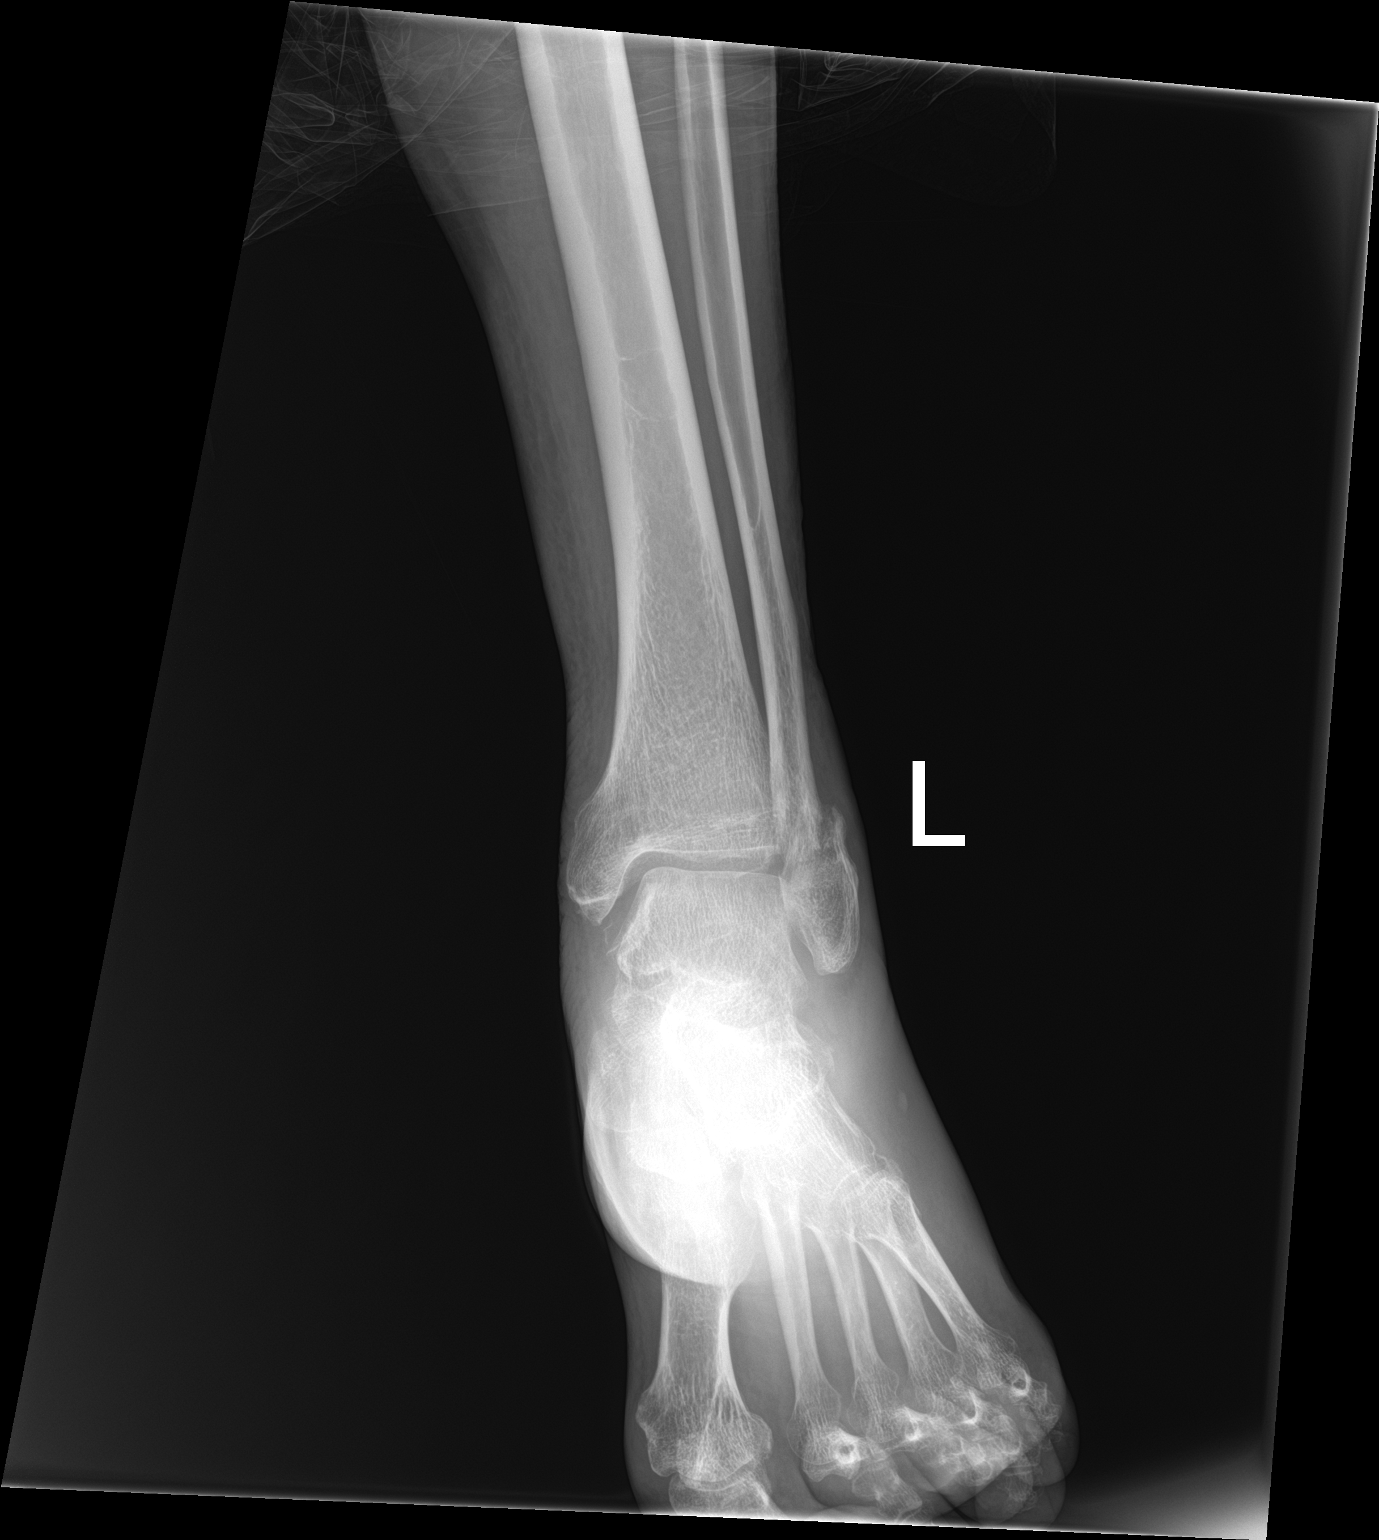

[2 of 2 positions shown; findings below may reference images not displayed]

FINDINGS: A fracture of the distal fibula is displaced laterally and
anteriorly. Additional bone fragments are present at the medial
malleolus without a definite donor site.
IMPRESSION: 1. Lateral malleolus fracture with anterolateral displacement.
2. Bone fragments at the medial malleolus likely reflecting medial
malleolar fracture as well.
